# Patient Record
Sex: Female | Born: 1985 | Race: Black or African American | Hispanic: No | Marital: Single | Smoking: Never smoker
Health system: Southern US, Community
[De-identification: ages and names within clinical notes are randomized; demographics above are authoritative.]

## PROBLEM LIST (undated history)

## (undated) ENCOUNTER — Inpatient Hospital Stay (HOSPITAL_COMMUNITY): Payer: Self-pay

## (undated) DIAGNOSIS — E079 Disorder of thyroid, unspecified: Secondary | ICD-10-CM

## (undated) DIAGNOSIS — E039 Hypothyroidism, unspecified: Secondary | ICD-10-CM

## (undated) DIAGNOSIS — J302 Other seasonal allergic rhinitis: Secondary | ICD-10-CM

## (undated) DIAGNOSIS — E550 Rickets, active: Secondary | ICD-10-CM

## (undated) HISTORY — PX: TOOTH EXTRACTION: SUR596

---

## 2012-09-02 ENCOUNTER — Encounter (HOSPITAL_COMMUNITY): Payer: Self-pay | Admitting: Emergency Medicine

## 2012-09-02 ENCOUNTER — Emergency Department (HOSPITAL_COMMUNITY)
Admission: EM | Admit: 2012-09-02 | Discharge: 2012-09-02 | Disposition: A | Discharge status: Home / Self Care | Payer: Medicaid Other | Attending: Emergency Medicine | Admitting: Emergency Medicine

## 2012-09-02 DIAGNOSIS — Z79899 Other long term (current) drug therapy: Secondary | ICD-10-CM | POA: Insufficient documentation

## 2012-09-02 DIAGNOSIS — E079 Disorder of thyroid, unspecified: Secondary | ICD-10-CM | POA: Insufficient documentation

## 2012-09-02 DIAGNOSIS — M79609 Pain in unspecified limb: Secondary | ICD-10-CM | POA: Insufficient documentation

## 2012-09-02 DIAGNOSIS — E643 Sequelae of rickets: Secondary | ICD-10-CM | POA: Insufficient documentation

## 2012-09-02 DIAGNOSIS — Z8639 Personal history of other endocrine, nutritional and metabolic disease: Secondary | ICD-10-CM

## 2012-09-02 HISTORY — DX: Rickets, active: E55.0

## 2012-09-02 HISTORY — DX: Disorder of thyroid, unspecified: E07.9

## 2012-09-02 MED ORDER — ACETAMINOPHEN 500 MG PO TABS: 1000.0000 mg | ORAL_TABLET | Freq: Once | ORAL | Status: DC

## 2012-09-02 MED ORDER — OXYCODONE-ACETAMINOPHEN 5-325 MG PO TABS: ORAL_TABLET | ORAL | Status: DC

## 2012-09-02 MED ORDER — ACETAMINOPHEN 325 MG PO TABS
975.0000 mg | ORAL_TABLET | Freq: Once | ORAL | Status: AC
  Administered 2012-09-02: 975 mg via ORAL
  Filled 2012-09-02: qty 3

## 2012-09-02 NOTE — ED Notes (Signed)
Pt c/o B/l leg pain x 1 month. Pt ambulatory in triage.

## 2012-09-02 NOTE — ED Provider Notes (Signed)
Medical screening examination/treatment/procedure(s) were performed by non-physician practitioner and as supervising physician I was immediately available for consultation/collaboration.   Shelda Jakes, MD 09/02/12 [phone]

## 2012-09-02 NOTE — ED Provider Notes (Signed)
History     CSN: [id number]  Arrival date & time 09/02/12  7829   First MD Initiated Contact with Patient 09/02/12 1031      Chief Complaint  Patient presents with  . Leg Pain    (Consider location/radiation/quality/duration/timing/severity/associated sxs/prior treatment) HPI Comments: Pt born with rickets. Treated by othopedist in [place], [place] for the last month. She was scheduled for surgery to straighten the legs. Pt started levothyroxine and seeing an endocrinologist back in [place], too. Pt states it did not help the pain in her legs, but helped her sleep better at night.  Pt has been in town since November. Pain is constant. Worse when active or sitting too long. Legs get very stiff. Pt has been taking ibuprofen which provides a little help. But not much.  Patient is a 27 y.o. female presenting with pain secondary to rickets (congenital) Severity: severe Onset quality: waxes and wanes Duration: since birth Timing: Constant  Progression: worsening Relieved by: tylenol Worsened by: weight bearing Ineffective treatments: None tried      Patient is a 27 y.o. female presenting with leg pain.  Leg Pain Associated symptoms: no fever and no neck pain     Past Medical History  Diagnosis Date  . Rickets   . Thyroid disease hypothyroid    History reviewed. No pertinent past surgical history.  No family history on file.  History  Substance Use Topics  . Smoking status: Never Smoker   . Smokeless tobacco: Not on file  . Alcohol Use: Yes     Comment: occ    OB History   Grav Para Term Preterm Abortions TAB SAB Ect Mult Living                  Review of Systems  Constitutional: Negative for fever and diaphoresis.  HENT: Negative for neck pain and neck stiffness.   Eyes: Negative for visual disturbance.  Respiratory: Negative for apnea, chest tightness and shortness of breath.   Cardiovascular: Negative for chest pain and palpitations.  Gastrointestinal:  Negative for nausea, vomiting, diarrhea and constipation.  Genitourinary: Negative for dysuria.  Musculoskeletal: Positive for arthralgias and gait problem.       Bilateral leg pain just below the knee secondary to congenital rickets.  Skin: Negative for rash.  Neurological: Negative for dizziness, weakness, light-headedness, numbness and headaches.    Allergies  Review of patient's allergies indicates no known allergies.  Home Medications   Current Outpatient Rx  Name  Route  Sig  Dispense  Refill  . levothyroxine (SYNTHROID, LEVOTHROID) 50 MCG tablet   Oral   Take 50 mcg by mouth daily.           BP 135/79  Pulse 71  Temp(Src) 98.2 F (36.8 C) (Oral)  Resp 18  SpO2 98%  LMP 09/02/2012  Physical Exam  Nursing note and vitals reviewed. Constitutional: She is oriented to person, place, and time. She appears well-developed and well-nourished. No distress.  HENT:  Head: Normocephalic and atraumatic.  Eyes: EOM are normal. Pupils are equal, round, and reactive to light.  Neck: Normal range of motion. Neck supple.  No meningeal signs  Cardiovascular: Normal rate, regular rhythm and normal heart sounds.  Exam reveals no gallop and no friction rub.   No murmur heard. Pulmonary/Chest: Effort normal and breath sounds normal. No respiratory distress. She has no wheezes. She has no rales. She exhibits no tenderness.  Abdominal: Soft. Bowel sounds are normal. She exhibits no distension. There is  no tenderness. There is no rebound and no guarding.  Musculoskeletal: Normal range of motion. She exhibits no edema and no tenderness.  5/5 strength throughout. Mild swelling. No erythema. No warmth. No effusion. Good quadricep strength on straight leg raise. No joint laxity.  Neurological: She is alert and oriented to person, place, and time. No cranial nerve deficit.  Skin: Skin is warm and dry. She is not diaphoretic. No erythema.    ED Course  Procedures (including critical care  time)  Labs Reviewed - No data to display No results found.   Diagnosis: rickets, chronic    MDM  Pt has hx of rickets and states this pain is related to that chronic condition. Pt is new to town and did not know where to go to have relief from pain. Will provide resource guide and counsel pt to establish relationship with primary care provider for this chronic condition. No imaging needed at this time.  At this time there does not appear to be any evidence of an acute emergency medical condition and the patient appears stable for discharge with appropriate outpatient follow up.Diagnosis was discussed with patient who verbalizes understanding and is agreeable to discharge. Pt case discussed with Dr. Saul Fordyce who agrees with my plan.    Glade Nurse, PA-C 09/02/12 1643

## 2012-09-15 ENCOUNTER — Emergency Department (HOSPITAL_COMMUNITY)
Admission: EM | Admit: 2012-09-15 | Discharge: 2012-09-16 | Discharge status: Against Medical Advice | Payer: Medicaid Other | Attending: Emergency Medicine | Admitting: Emergency Medicine

## 2012-09-15 ENCOUNTER — Encounter (HOSPITAL_COMMUNITY): Payer: Self-pay | Admitting: Adult Health

## 2012-09-15 DIAGNOSIS — R51 Headache: Secondary | ICD-10-CM | POA: Insufficient documentation

## 2012-09-15 DIAGNOSIS — R110 Nausea: Secondary | ICD-10-CM | POA: Insufficient documentation

## 2012-09-15 DIAGNOSIS — H53149 Visual discomfort, unspecified: Secondary | ICD-10-CM | POA: Insufficient documentation

## 2012-09-15 NOTE — ED Notes (Signed)
Pt sts more severe HA

## 2012-09-15 NOTE — ED Notes (Signed)
Reports sudden onset of head throbbing one hour ago associated with light and sound sensitivity and nausea. Pt is alert and oriented. Tearful. MAEx4. Denies headache history.

## 2012-09-15 NOTE — ED Notes (Signed)
Called patient for room placement, no answer  

## 2012-09-18 ENCOUNTER — Encounter (HOSPITAL_COMMUNITY): Payer: Self-pay

## 2012-09-18 ENCOUNTER — Emergency Department (HOSPITAL_COMMUNITY)
Admission: EM | Admit: 2012-09-18 | Discharge: 2012-09-18 | Disposition: A | Discharge status: Home / Self Care | Payer: Medicaid Other | Attending: Emergency Medicine | Admitting: Emergency Medicine

## 2012-09-18 ENCOUNTER — Emergency Department (HOSPITAL_COMMUNITY): Payer: Medicaid Other

## 2012-09-18 DIAGNOSIS — H53149 Visual discomfort, unspecified: Secondary | ICD-10-CM | POA: Insufficient documentation

## 2012-09-18 DIAGNOSIS — J3489 Other specified disorders of nose and nasal sinuses: Secondary | ICD-10-CM | POA: Insufficient documentation

## 2012-09-18 DIAGNOSIS — R51 Headache: Secondary | ICD-10-CM | POA: Insufficient documentation

## 2012-09-18 DIAGNOSIS — Z79899 Other long term (current) drug therapy: Secondary | ICD-10-CM | POA: Insufficient documentation

## 2012-09-18 DIAGNOSIS — R110 Nausea: Secondary | ICD-10-CM | POA: Insufficient documentation

## 2012-09-18 DIAGNOSIS — Z3202 Encounter for pregnancy test, result negative: Secondary | ICD-10-CM | POA: Insufficient documentation

## 2012-09-18 DIAGNOSIS — E079 Disorder of thyroid, unspecified: Secondary | ICD-10-CM | POA: Insufficient documentation

## 2012-09-18 DIAGNOSIS — Z8639 Personal history of other endocrine, nutritional and metabolic disease: Secondary | ICD-10-CM | POA: Insufficient documentation

## 2012-09-18 DIAGNOSIS — J029 Acute pharyngitis, unspecified: Secondary | ICD-10-CM | POA: Insufficient documentation

## 2012-09-18 DIAGNOSIS — Z862 Personal history of diseases of the blood and blood-forming organs and certain disorders involving the immune mechanism: Secondary | ICD-10-CM | POA: Insufficient documentation

## 2012-09-18 LAB — URINALYSIS, ROUTINE W REFLEX MICROSCOPIC
Bilirubin Urine: NEGATIVE
Nitrite: NEGATIVE
Specific Gravity, Urine: 1.024 (ref 1.005–1.030)
Urobilinogen, UA: 0.2 mg/dL (ref 0.0–1.0)

## 2012-09-18 LAB — BASIC METABOLIC PANEL
CO2: 22 mEq/L (ref 19–32)
Calcium: 9 mg/dL (ref 8.4–10.5)
GFR calc Af Amer: >90 mL/min (ref >90)
GFR calc non Af Amer: >90 mL/min (ref >90)
Sodium: 136 mEq/L (ref 135–145)

## 2012-09-18 LAB — CBC WITH DIFFERENTIAL/PLATELET
Basophils Absolute: 0 10*3/uL (ref 0.0–0.1)
Eosinophils Relative: 1 % (ref 0–5)
Lymphocytes Relative: 33 % (ref 12–46)
MCV: 92 fL (ref 78.0–100.0)
Platelets: 240 10*3/uL (ref 150–400)
RDW: 12.2 % (ref 11.5–15.5)
WBC: 4 10*3/uL (ref 4.0–10.5)

## 2012-09-18 LAB — URINE MICROSCOPIC-ADD ON

## 2012-09-18 LAB — PREGNANCY, URINE: Preg Test, Ur: NEGATIVE

## 2012-09-18 MED ORDER — HYDROCODONE-ACETAMINOPHEN 5-325 MG PO TABS: 1.0000 | ORAL_TABLET | Freq: Four times a day (QID) | ORAL | Status: DC | PRN

## 2012-09-18 MED ORDER — NAPROXEN 500 MG PO TABS: 500.0000 mg | ORAL_TABLET | Freq: Two times a day (BID) | ORAL | Status: DC

## 2012-09-18 NOTE — ED Provider Notes (Signed)
History     CSN: [id number]  Arrival date & time 09/18/12  1004   First MD Initiated Contact with Patient 09/18/12 1116      Chief Complaint  Patient presents with  . Headache    (Consider location/radiation/quality/duration/timing/severity/associated sxs/prior treatment) Patient is a 27 y.o. female presenting with headaches. The history is provided by the patient.  Headache Associated symptoms: congestion, nausea, photophobia and sore throat   Associated symptoms: no abdominal pain, no back pain, no pain, no fever and no vomiting    patient with onset of headache for 2 days ago to the back of the head and then to the frontal area associated with some nausea no vertigo some photophobia. Patient's had a history of headaches in the past but nothing exactly like this. Patient states that the headache has remained the same. Improves some with Advil. Currently the headache pain is 4/10.  Past Medical History  Diagnosis Date  . Rickets   . Thyroid disease hypothyroid    History reviewed. No pertinent past surgical history.  No family history on file.  History  Substance Use Topics  . Smoking status: Never Smoker   . Smokeless tobacco: Not on file  . Alcohol Use: Yes     Comment: occ    OB History   Grav Para Term Preterm Abortions TAB SAB Ect Mult Living                  Review of Systems  Constitutional: Negative for fever.  HENT: Positive for congestion and sore throat.   Eyes: Positive for photophobia. Negative for pain and visual disturbance.  Respiratory: Negative for shortness of breath.   Cardiovascular: Negative for chest pain.  Gastrointestinal: Positive for nausea. Negative for vomiting and abdominal pain.  Genitourinary: Negative for dysuria.  Musculoskeletal: Negative for back pain.  Skin: Negative for rash.  Neurological: Positive for headaches.  Hematological: Does not bruise/bleed easily.  Psychiatric/Behavioral: Negative for confusion.     Allergies  Review of patient's allergies indicates no known allergies.  Home Medications   Current Outpatient Rx  Name  Route  Sig  Dispense  Refill  . levothyroxine (SYNTHROID, LEVOTHROID) 50 MCG tablet   Oral   Take 50 mcg by mouth daily.         Marland Kitchen HYDROcodone-acetaminophen (NORCO/VICODIN) 5-325 MG per tablet   Oral   Take 1-2 tablets by mouth every 6 (six) hours as needed for pain.   10 tablet   0   . naproxen (NAPROSYN) 500 MG tablet   Oral   Take 1 tablet (500 mg total) by mouth 2 (two) times daily.   14 tablet   0     BP 114/73  Pulse 80  Temp(Src) 97.7 F (36.5 C) (Oral)  Resp 20  SpO2 95%  LMP 09/02/2012  Physical Exam  Nursing note and vitals reviewed. Constitutional: She is oriented to person, place, and time. She appears well-developed and well-nourished. No distress.  HENT:  Head: Normocephalic and atraumatic.  Mouth/Throat: Oropharynx is clear and moist.  Eyes: Conjunctivae and EOM are normal. Pupils are equal, round, and reactive to light.  Neck: Normal range of motion. Neck supple.  Cardiovascular: Normal rate, regular rhythm and normal heart sounds.   Pulmonary/Chest: Effort normal and breath sounds normal.  Abdominal: Soft. Bowel sounds are normal. There is no tenderness.  Musculoskeletal: Normal range of motion. She exhibits no edema.  Neurological: She is alert and oriented to person, place, and time. No cranial  nerve deficit. Coordination normal.  Skin: Skin is warm. No rash noted.    ED Course  Procedures (including critical care time)  Labs Reviewed  URINALYSIS, ROUTINE W REFLEX MICROSCOPIC - Abnormal; Notable for the following:    Hgb urine dipstick MODERATE (*)    All other components within normal limits  BASIC METABOLIC PANEL - Abnormal; Notable for the following:    Creatinine, Ser 0.43 (*)    All other components within normal limits  PREGNANCY, URINE  CBC WITH DIFFERENTIAL  URINE MICROSCOPIC-ADD ON   Ct Head Wo  Contrast  09/18/2012  *RADIOLOGY REPORT*  Clinical Data: Progressive headache.  CT HEAD WITHOUT CONTRAST  Technique:  Contiguous axial images were obtained from the base of the skull through the vertex without contrast.  Comparison: None.  Findings: No acute intracranial abnormality.  Specifically, no hemorrhage, hydrocephalus, mass lesion, acute infarction, or significant intracranial injury.  No acute calvarial abnormality. Visualized paranasal sinuses and mastoids clear.  Orbital soft tissues unremarkable.  IMPRESSION: No acute intracranial abnormality.   Original Report Authenticated By: Charlett Nose, M.D.      1. Headache       MDM   Patient with headache for the past few days. Also has some mild upper respiratory cold-like symptoms. Headache may be related to the viral illness. Workup without any significant findings. We'll treat with Naprosyn and hydrocodone for the next few days. Resource guide provided to the patient. Patient no acute distress nontoxic. No history of similar headaches in the past. Head CT was negative for any evidence of bleed or other abnormality. Symptoms not consistent with subarachnoid hemorrhage.       Shelda Jakes, MD 09/18/12 [phone]

## 2012-09-18 NOTE — ED Notes (Signed)
Pt. Having a headache since Monday.   Pain increases when she moves her neck.  Pt. Is also having nausea.  No neuro deficits noted.  Skin is warm and dry.   Denies any vomiting

## 2012-10-16 ENCOUNTER — Ambulatory Visit: Payer: Self-pay | Admitting: Endocrinology

## 2012-10-16 DIAGNOSIS — Z0289 Encounter for other administrative examinations: Secondary | ICD-10-CM

## 2012-12-16 ENCOUNTER — Ambulatory Visit: Payer: Medicaid Other | Admitting: Endocrinology

## 2013-04-10 ENCOUNTER — Ambulatory Visit (INDEPENDENT_AMBULATORY_CARE_PROVIDER_SITE_OTHER): Payer: Medicaid Other | Admitting: Advanced Practice Midwife

## 2013-04-10 ENCOUNTER — Telehealth: Payer: Self-pay | Admitting: Advanced Practice Midwife

## 2013-04-10 ENCOUNTER — Encounter: Payer: Self-pay | Admitting: Advanced Practice Midwife

## 2013-04-10 VITALS — BP 117/79 | HR 72 | Temp 98.7°F | Ht 59.0 in | Wt 187.8 lb

## 2013-04-10 DIAGNOSIS — Z803 Family history of malignant neoplasm of breast: Secondary | ICD-10-CM | POA: Insufficient documentation

## 2013-04-10 DIAGNOSIS — Z0000 Encounter for general adult medical examination without abnormal findings: Secondary | ICD-10-CM

## 2013-04-10 DIAGNOSIS — M9080 Osteopathy in diseases classified elsewhere, unspecified site: Secondary | ICD-10-CM | POA: Insufficient documentation

## 2013-04-10 DIAGNOSIS — Z6837 Body mass index (BMI) 37.0-37.9, adult: Secondary | ICD-10-CM | POA: Insufficient documentation

## 2013-04-10 DIAGNOSIS — E550 Rickets, active: Secondary | ICD-10-CM | POA: Insufficient documentation

## 2013-04-10 DIAGNOSIS — E039 Hypothyroidism, unspecified: Secondary | ICD-10-CM | POA: Insufficient documentation

## 2013-04-10 NOTE — Progress Notes (Signed)
Subjective:     Julia Fowler is a 27 y.o. female here for a routine exam.  Current complaints: patient has been trying to conceive for the last two years without success. She is requesting a pregnancy test today.   Personal health questionnaire reviewed: yes.  Julia Fowler has a 9 year old son who has rickets. He is in therapy and doing well. She and her partner are trying to conceive for the past 2 years w/ out success. He has no children.   Patient has a sister diagnosed w/ BRCA @ 87 yo.    Gynecologic History Patient's last menstrual period was 03/24/2013. Contraception: none Last Pap: 01/2012. Results were: normal Last mammogram:N/A  Obstetric History OB History  No data available     The following portions of the patient's history were reviewed and updated as appropriate: allergies, current medications, past family history, past medical history, past social history, past surgical history and problem list.  Review of Systems A comprehensive review of systems was negative.    Objective:    BP 117/79  Pulse 72  Temp(Src) 98.7 F (37.1 C) (Oral)  Ht 4\' 11"  (1.499 m)  Wt 187 lb 12.8 oz (85.186 kg)  BMI 37.91 kg/m2  LMP 03/24/2013  General Appearance:    Alert, cooperative, no distress, appears stated age  Head:    Normocephalic, without obvious abnormality, atraumatic  Eyes:    PERRL, conjunctiva/corneas clear, EOM's intact, fundi    benign, both eyes  Ears:    Normal TM's and external ear canals, both ears  Nose:   Nares normal, septum midline, mucosa normal, no drainage    or sinus tenderness  Throat:   Lips, mucosa, and tongue normal; teeth and gums normal  Neck:   Supple, symmetrical, trachea midline, no adenopathy;    thyroid:  no enlargement/tenderness/nodules; no carotid   bruit or JVD  Back:     Symmetric, no curvature, ROM normal, no CVA tenderness  Lungs:     Clear to auscultation bilaterally, respirations unlabored  Chest Wall:    No tenderness or deformity   Heart:    Regular rate and rhythm, S1 and S2 normal, no murmur, rub   or gallop  Breast Exam:    No tenderness, masses, or nipple abnormality  Abdomen:     Soft, non-tender, bowel sounds active all four quadrants,    no masses, no organomegaly  Genitalia:    Normal female without lesion, discharge or tenderness. Cervix on anterior vaginal wall, uterus retroverted.     Extremities:   Extremities normal, atraumatic, no cyanosis or edema  Pulses:   2+ and symmetric all extremities  Skin:   Skin color, texture, turgor normal, no rashes or lesions  Lymph nodes:   Cervical, supraclavicular, and axillary nodes normal  Neurologic:   CNII-XII intact, normal strength, sensation and reflexes    throughout      Assessment:   Patient Active Problem List   Diagnosis Date Noted  . Hypothyroid 04/10/2013  . BMI 37.0-37.9, adult 04/10/2013  . Rickets 04/10/2013   Well woman exam Infertility Family Hx of BRCA   Plan:    Education reviewed: calcium supplements, depression evaluation, low fat, low cholesterol diet, safe sex/STD prevention, self breast exams and weight bearing exercise. Follow up in: as needed or in 6 months if no success w/ conceiving..   Reviewed in detail conceiving information and plan. Patient to return if after 6 months of tracking cycles and having intercourse during ovulation she is  not pregnant. Reviewed w/ patient to track signs of ovulation. Patient to see endocrinologist for immediate management of thyroid.  Patient sees therapist for complications assoc. W/ Rickets.  Patient to RTC for BRCA gene screening.  45 min spent with patient greater than 80% spent in counseling and coordination of care.    Trip Cavanagh Wilson Singer CNM

## 2013-04-11 LAB — GC/CHLAMYDIA PROBE AMP: CT Probe RNA: NEGATIVE

## 2013-04-17 ENCOUNTER — Other Ambulatory Visit: Payer: Medicaid Other

## 2013-06-01 ENCOUNTER — Other Ambulatory Visit (INDEPENDENT_AMBULATORY_CARE_PROVIDER_SITE_OTHER): Payer: Medicaid Other

## 2013-06-01 VITALS — Temp 98.2°F | Wt 179.0 lb

## 2013-06-01 DIAGNOSIS — Z3200 Encounter for pregnancy test, result unknown: Secondary | ICD-10-CM

## 2013-06-01 DIAGNOSIS — Z3201 Encounter for pregnancy test, result positive: Secondary | ICD-10-CM

## 2013-06-01 NOTE — Progress Notes (Signed)
Pt is here for a upt.  Pt states that her last menstral cycle was on 04/22/2013. Pt states that she did have a positive home test. In office UPT is positive.

## 2013-06-07 ENCOUNTER — Encounter (HOSPITAL_COMMUNITY): Payer: Self-pay | Admitting: Emergency Medicine

## 2013-06-07 ENCOUNTER — Emergency Department (HOSPITAL_COMMUNITY)
Admission: EM | Admit: 2013-06-07 | Discharge: 2013-06-07 | Disposition: A | Discharge status: Home / Self Care | Payer: Medicaid Other | Attending: Emergency Medicine | Admitting: Emergency Medicine

## 2013-06-07 DIAGNOSIS — H6593 Unspecified nonsuppurative otitis media, bilateral: Secondary | ICD-10-CM

## 2013-06-07 DIAGNOSIS — J069 Acute upper respiratory infection, unspecified: Secondary | ICD-10-CM | POA: Insufficient documentation

## 2013-06-07 DIAGNOSIS — O9989 Other specified diseases and conditions complicating pregnancy, childbirth and the puerperium: Secondary | ICD-10-CM | POA: Insufficient documentation

## 2013-06-07 DIAGNOSIS — Z862 Personal history of diseases of the blood and blood-forming organs and certain disorders involving the immune mechanism: Secondary | ICD-10-CM | POA: Insufficient documentation

## 2013-06-07 DIAGNOSIS — H748X9 Other specified disorders of middle ear and mastoid, unspecified ear: Secondary | ICD-10-CM | POA: Insufficient documentation

## 2013-06-07 DIAGNOSIS — Z8639 Personal history of other endocrine, nutritional and metabolic disease: Secondary | ICD-10-CM | POA: Insufficient documentation

## 2013-06-07 DIAGNOSIS — R110 Nausea: Secondary | ICD-10-CM | POA: Insufficient documentation

## 2013-06-07 MED ORDER — OXYMETAZOLINE HCL 0.05 % NA SOLN
1.0000 | Freq: Two times a day (BID) | NASAL | Status: DC
  Administered 2013-06-07: 1 via NASAL
  Filled 2013-06-07: qty 15

## 2013-06-07 NOTE — ED Provider Notes (Signed)
Medical screening examination/treatment/procedure(s) were performed by non-physician practitioner and as supervising physician I was immediately available for consultation/collaboration.  EKG Interpretation   None        Dolan Xia R. Alieu Finnigan, MD 06/07/13 2359 

## 2013-06-07 NOTE — ED Notes (Addendum)
Pt c/o intermittent R ear pain and hearing a "wooshing" on and off in her R ear.  Pt also states her throat has been "dry and phlegmy".  Pt is 6.[redacted] weeks pregnant.

## 2013-06-07 NOTE — Discharge Instructions (Signed)
1. Medications: Aftin (USE THIS FOR 3 DAYS ONLY THEN STOP), usual home medications 2. Treatment: rest, drink plenty of fluids, also use a cool mist vaporizer,  3. Follow Up: Please followup with your primary doctor for discussion of your diagnoses and further evaluation after today's visit; if you do not have a primary care doctor use the resource guide provided to find one;    Upper Respiratory Infection, Adult An upper respiratory infection (URI) is also sometimes known as the common cold. The upper respiratory tract includes the nose, sinuses, throat, trachea, and bronchi. Bronchi are the airways leading to the lungs. Most people improve within 1 week, but symptoms can last up to 2 weeks. A residual cough may last even longer.  CAUSES Many different viruses can infect the tissues lining the upper respiratory tract. The tissues become irritated and inflamed and often become very moist. Mucus production is also common. A cold is contagious. You can easily spread the virus to others by oral contact. This includes kissing, sharing a glass, coughing, or sneezing. Touching your mouth or nose and then touching a surface, which is then touched by another person, can also spread the virus. SYMPTOMS  Symptoms typically develop 1 to 3 days after you come in contact with a cold virus. Symptoms vary from person to person. They may include:  Runny nose.  Sneezing.  Nasal congestion.  Sinus irritation.  Sore throat.  Loss of voice (laryngitis).  Cough.  Fatigue.  Muscle aches.  Loss of appetite.  Headache.  Low-grade fever. DIAGNOSIS  You might diagnose your own cold based on familiar symptoms, since most people get a cold 2 to 3 times a year. Your caregiver can confirm this based on your exam. Most importantly, your caregiver can check that your symptoms are not due to another disease such as strep throat, sinusitis, pneumonia, asthma, or epiglottitis. Blood tests, throat tests, and X-rays  are not necessary to diagnose a common cold, but they may sometimes be helpful in excluding other more serious diseases. Your caregiver will decide if any further tests are required. RISKS AND COMPLICATIONS  You may be at risk for a more severe case of the common cold if you smoke cigarettes, have chronic heart disease (such as heart failure) or lung disease (such as asthma), or if you have a weakened immune system. The very young and very old are also at risk for more serious infections. Bacterial sinusitis, middle ear infections, and bacterial pneumonia can complicate the common cold. The common cold can worsen asthma and chronic obstructive pulmonary disease (COPD). Sometimes, these complications can require emergency medical care and may be life-threatening. PREVENTION  The best way to protect against getting a cold is to practice good hygiene. Avoid oral or hand contact with people with cold symptoms. Wash your hands often if contact occurs. There is no clear evidence that vitamin C, vitamin E, echinacea, or exercise reduces the chance of developing a cold. However, it is always recommended to get plenty of rest and practice good nutrition. TREATMENT  Treatment is directed at relieving symptoms. There is no cure. Antibiotics are not effective, because the infection is caused by a virus, not by bacteria. Treatment may include:  Increased fluid intake. Sports drinks offer valuable electrolytes, sugars, and fluids.  Breathing heated mist or steam (vaporizer or shower).  Eating chicken soup or other clear broths, and maintaining good nutrition.  Getting plenty of rest.  Using gargles or lozenges for comfort.  Controlling fevers with ibuprofen  or acetaminophen as directed by your caregiver.  Increasing usage of your inhaler if you have asthma. Zinc gel and zinc lozenges, taken in the first 24 hours of the common cold, can shorten the duration and lessen the severity of symptoms. Pain medicines  may help with fever, muscle aches, and throat pain. A variety of non-prescription medicines are available to treat congestion and runny nose. Your caregiver can make recommendations and may suggest nasal or lung inhalers for other symptoms.  HOME CARE INSTRUCTIONS   Only take over-the-counter or prescription medicines for pain, discomfort, or fever as directed by your caregiver.  Use a warm mist humidifier or inhale steam from a shower to increase air moisture. This may keep secretions moist and make it easier to breathe.  Drink enough water and fluids to keep your urine clear or pale yellow.  Rest as needed.  Return to work when your temperature has returned to normal or as your caregiver advises. You may need to stay home longer to avoid infecting others. You can also use a face mask and careful hand washing to prevent spread of the virus. SEEK MEDICAL CARE IF:   After the first few days, you feel you are getting worse rather than better.  You need your caregiver's advice about medicines to control symptoms.  You develop chills, worsening shortness of breath, or brown or red sputum. These may be signs of pneumonia.  You develop yellow or brown nasal discharge or pain in the face, especially when you bend forward. These may be signs of sinusitis.  You develop a fever, swollen neck glands, pain with swallowing, or white areas in the back of your throat. These may be signs of strep throat. SEEK IMMEDIATE MEDICAL CARE IF:   You have a fever.  You develop severe or persistent headache, ear pain, sinus pain, or chest pain.  You develop wheezing, a prolonged cough, cough up blood, or have a change in your usual mucus (if you have chronic lung disease).  You develop sore muscles or a stiff neck. Document Released: 12/19/2000 Document Revised: 09/17/2011 Document Reviewed: 10/27/2010 [facility] Patient Information 2014 [place], [place].

## 2013-06-07 NOTE — ED Provider Notes (Signed)
CSN: [id number]     Arrival date & time 06/07/13  1612 History  This chart was scribed for non-physician practitioner Dierdre Forth, PA-C working with Juliet Rude. Rubin Payor, MD by Danella Maiers, ED Scribe. This patient was seen in room TR05C/TR05C and the patient's care was started at 4:55 PM.   Chief Complaint  Patient presents with  . Otalgia   The history is provided by the patient. No language interpreter was used.   HPI Comments: Julia Fowler is a 27 y.o. female who is [redacted] weeks pregnant who presents to the Emergency Department complaining of intermittent bilateral otalgia, worse on the right, for the past week. She states she has been hearing a "whooshing" noise intermittently in the same ear. Pt also reports having phlegm in her throat, nasal congestion and cough the last 2 days. She has been using Nasonex and states it only helps for a little while and then the pain and sound returns. She denies fevers, chills, vomiting. She reports nausea and states it is due to the pregnancy. She is taking her prenatal vitamins. She is 6.[redacted] weeks pregnant.  Past Medical History  Diagnosis Date  . Rickets   . Thyroid disease hypothyroid   History reviewed. No pertinent past surgical history. Family History  Problem Relation Age of Onset  . Cancer Sister     breast  . Diabetes Maternal Grandmother    History  Substance Use Topics  . Smoking status: Never Smoker   . Smokeless tobacco: Not on file  . Alcohol Use: Yes     Comment: occ   OB History   Grav Para Term Preterm Abortions TAB SAB Ect Mult Living   1              Review of Systems  Constitutional: Negative for fever, diaphoresis, appetite change, fatigue and unexpected weight change.  HENT: Positive for ear pain. Negative for mouth sores.   Eyes: Negative for visual disturbance.  Respiratory: Positive for cough. Negative for chest tightness, shortness of breath and wheezing.   Cardiovascular: Negative for chest pain.   Gastrointestinal: Positive for nausea. Negative for vomiting, abdominal pain, diarrhea and constipation.  Endocrine: Negative for polydipsia, polyphagia and polyuria.  Genitourinary: Negative for dysuria, urgency, frequency and hematuria.  Musculoskeletal: Negative for back pain and neck stiffness.  Skin: Negative for rash.  Allergic/Immunologic: Negative for immunocompromised state.  Neurological: Negative for syncope, light-headedness and headaches.  Hematological: Does not bruise/bleed easily.  Psychiatric/Behavioral: Negative for sleep disturbance. The patient is not nervous/anxious.     Allergies  Review of patient's allergies indicates no known allergies.  Home Medications   Current Outpatient Rx  Name  Route  Sig  Dispense  Refill  . Prenatal Vit-Fe Fumarate-FA (PRENATAL MULTIVITAMIN) TABS tablet   Oral   Take 1 tablet by mouth daily at 12 noon.          BP 132/70  Pulse 73  Temp(Src) 98 F (36.7 C) (Oral)  Resp 18  Wt 181 lb 9.6 oz (82.373 kg)  SpO2 98%  LMP 04/22/2013 Physical Exam  Nursing note and vitals reviewed. Constitutional: She is oriented to person, place, and time. She appears well-developed and well-nourished. No distress.  Awake, alert, nontoxic appearance  HENT:  Head: Normocephalic and atraumatic.  Right Ear: Hearing, tympanic membrane, external ear and ear canal normal. Tympanic membrane is not injected, not scarred, not perforated, not erythematous, not retracted and not bulging. Right ear middle ear effusion: clear without bugling.  Left Ear:  Hearing, tympanic membrane, external ear and ear canal normal. Tympanic membrane is not injected, not scarred, not perforated, not erythematous, not retracted and not bulging. Left ear middle ear effusion:  clear without bulging.  Nose: Mucosal edema and rhinorrhea present.  Mouth/Throat: Uvula is midline. No uvula swelling. Posterior oropharyngeal edema and posterior oropharyngeal erythema present. No  oropharyngeal exudate or tonsillar abscesses.  Mild bilateral ear effusion of clear fluid without TM bulging or erythema. Normal hearing in both ears Mild edema and erythema of the oropharynx with open crypts but without exudate  Eyes: Conjunctivae are normal. No scleral icterus.  Neck: Normal range of motion. Neck supple.  Cardiovascular: Normal rate, regular rhythm, normal heart sounds and intact distal pulses.   No murmur heard. Pulmonary/Chest: Effort normal and breath sounds normal. No respiratory distress. She has no wheezes. She has no rales.  Abdominal: Soft. Bowel sounds are normal. She exhibits no distension. There is no tenderness. There is no rebound.  abd soft and nontender  Musculoskeletal: Normal range of motion. She exhibits no edema.  Neurological: She is alert and oriented to person, place, and time.  Speech is clear and goal oriented Moves extremities without ataxia  Skin: Skin is warm and dry. No rash noted. She is not diaphoretic. No erythema.  Psychiatric: She has a normal mood and affect.    ED Course  Procedures (including critical care time) Medications  oxymetazoline (AFRIN) 0.05 % nasal spray 1 spray (not administered)    DIAGNOSTIC STUDIES: Oxygen Saturation is 98% on RA, normal by my interpretation.    COORDINATION OF CARE: 5:20 PM- Discussed treatment plan with pt. Pt agrees to plan.    Labs Review Labs Reviewed - No data to display Imaging Review No results found.  EKG Interpretation   None       MDM   1. Middle ear effusion, bilateral   2. Viral URI with cough     Julia Fowler presents with viral URI symptoms and "whooshing" in her ears.  Patient without exudate but mild tonsillar edema, nasal congestion and clear effusion of fluid behind the TMs. No evidence of acute otitis media.  Patient is already using Nasonex. Discussed with [facility] the use of Afrin as it is a pregnancy category C. They recommend use for 3 days and states  it is safe in early pregnancy.  Patient has been cautioned to D/C the medication after 3 days and followup with her primary care physician.  There is no evidence of peritonsillar abscess, bacterial pharyngitis or pneumonia.  Patient denies abdominal pain or vaginal bleeding and is receiving routine prenatal care.   It has been determined that no acute conditions requiring further emergency intervention are present at this time. The patient/guardian have been advised of the diagnosis and plan. We have discussed signs and symptoms that warrant return to the ED, such as changes or worsening in symptoms.   Vital signs are stable at discharge.   BP 132/70  Pulse 73  Temp(Src) 98 F (36.7 C) (Oral)  Resp 18  Wt 181 lb 9.6 oz (82.373 kg)  SpO2 98%  LMP 04/22/2013  Patient/guardian has voiced understanding and agreed to follow-up with the PCP or specialist.      Dierdre Forth, PA-C 06/07/13 1735

## 2013-06-13 ENCOUNTER — Encounter (HOSPITAL_COMMUNITY): Payer: Self-pay | Admitting: Emergency Medicine

## 2013-06-13 ENCOUNTER — Emergency Department (HOSPITAL_COMMUNITY)
Admission: EM | Admit: 2013-06-13 | Discharge: 2013-06-13 | Disposition: A | Discharge status: Home / Self Care | Payer: Medicaid Other | Attending: Emergency Medicine | Admitting: Emergency Medicine

## 2013-06-13 DIAGNOSIS — H9201 Otalgia, right ear: Secondary | ICD-10-CM

## 2013-06-13 DIAGNOSIS — H9209 Otalgia, unspecified ear: Secondary | ICD-10-CM | POA: Insufficient documentation

## 2013-06-13 DIAGNOSIS — O9989 Other specified diseases and conditions complicating pregnancy, childbirth and the puerperium: Secondary | ICD-10-CM | POA: Insufficient documentation

## 2013-06-13 DIAGNOSIS — Z862 Personal history of diseases of the blood and blood-forming organs and certain disorders involving the immune mechanism: Secondary | ICD-10-CM | POA: Insufficient documentation

## 2013-06-13 DIAGNOSIS — J3489 Other specified disorders of nose and nasal sinuses: Secondary | ICD-10-CM | POA: Insufficient documentation

## 2013-06-13 DIAGNOSIS — Z8639 Personal history of other endocrine, nutritional and metabolic disease: Secondary | ICD-10-CM | POA: Insufficient documentation

## 2013-06-13 HISTORY — DX: Other seasonal allergic rhinitis: J30.2

## 2013-06-13 NOTE — ED Notes (Signed)
Pt states her right ear has been bothering her for the past 3 weeks. Has been seen here prior for this and was given a decongestant and told she had fluid behind her ear.

## 2013-06-13 NOTE — ED Provider Notes (Signed)
CSN: [id number]     Arrival date & time 06/13/13  0705 History   First MD Initiated Contact with Patient 06/13/13 [phone]     Chief Complaint  Patient presents with  . Otalgia   (Consider location/radiation/quality/duration/timing/severity/associated sxs/prior Treatment) HPI Comments: Patient is a 27 year old female who is currently 7.[redacted] weeks pregnant who presents for right-sided otalgia x3 weeks. Patient states the pain is pressure-like in nature and she describes an "whooshing" in her right ear associated with her symptoms. Patient also endorses associated nasal congestion that has persisted since symptom onset. She has tried Mucinex and Afrin for symptoms. She states she used Afrin for approximately 5 days despite being instructed to use for only 3. Patient denies seeing her primary doctor for this complaint. She denies associated fever, rhinorrhea, ear redness, hearing loss, ear discharge, sore throat, inability to swallow, neck stiffness, shortness of breath, abdominal pain, and vaginal bleeding or discharge.  Patient is a 27 y.o. female presenting with ear pain. The history is provided by the patient. No language interpreter was used.  Otalgia Associated symptoms: congestion   Associated symptoms: no fever     Past Medical History  Diagnosis Date  . Rickets   . Thyroid disease hypothyroid  . Seasonal allergies    History reviewed. No pertinent past surgical history. Family History  Problem Relation Age of Onset  . Cancer Sister     breast  . Diabetes Maternal Grandmother    History  Substance Use Topics  . Smoking status: Never Smoker   . Smokeless tobacco: Not on file  . Alcohol Use: Yes     Comment: occ   OB History   Grav Para Term Preterm Abortions TAB SAB Ect Mult Living   1              Review of Systems  Constitutional: Negative for fever.  HENT: Positive for congestion and ear pain.   All other systems reviewed and are negative.    Allergies  Review of  patient's allergies indicates no known allergies.  Home Medications   Current Outpatient Rx  Name  Route  Sig  Dispense  Refill  . Prenatal Vit-Fe Fumarate-FA (PRENATAL MULTIVITAMIN) TABS tablet   Oral   Take 1 tablet by mouth daily at 12 noon.          BP 114/73  Pulse 77  Temp(Src) 98.3 F (36.8 C) (Oral)  Resp 20  Ht 4\' 11"  (1.499 m)  Wt 178 lb (80.74 kg)  BMI 35.93 kg/m2  SpO2 98%  LMP 04/22/2013  Physical Exam  Nursing note and vitals reviewed. Constitutional: She is oriented to person, place, and time. She appears well-developed and well-nourished. No distress.  HENT:  Head: Normocephalic and atraumatic.  Right Ear: External ear and ear canal normal. Tympanic membrane is not injected, not perforated, not erythematous, not retracted and not bulging. A middle ear effusion (Mild) is present. No decreased hearing is noted.  Left Ear: External ear and ear canal normal. Tympanic membrane is not injected, not perforated, not erythematous, not retracted and not bulging. A middle ear effusion (Mild) is present. No decreased hearing is noted.  Nose: Right sinus exhibits maxillary sinus tenderness.  Mouth/Throat: Uvula is midline, oropharynx is clear and moist and mucous membranes are normal. No oropharyngeal exudate or posterior oropharyngeal erythema.  Mild bilateral middle ear effusion appreciated. Fluid clear and nonpurulent. No mastoid swelling, erythema, heat to touch, or tenderness to palpation bilaterally. Hearing grossly intact bilaterally.  Eyes: Conjunctivae  and EOM are normal. Pupils are equal, round, and reactive to light. Right eye exhibits no discharge. Left eye exhibits no discharge. No scleral icterus.  Neck: Normal range of motion. Neck supple.  Patient moves neck with ease. No anterior cervical lymphadenopathy appreciated  Cardiovascular: Normal rate, regular rhythm and normal heart sounds.   Pulmonary/Chest: Effort normal and breath sounds normal. No respiratory  distress. She has no wheezes. She has no rales.  Abdominal: Soft. There is no tenderness. There is no rebound.  Musculoskeletal: Normal range of motion.  Neurological: She is alert and oriented to person, place, and time.  Skin: Skin is warm and dry. No rash noted. She is not diaphoretic. No erythema. No pallor.  Psychiatric: She has a normal mood and affect. Her behavior is normal.    ED Course  Procedures (including critical care time) Labs Review Labs Reviewed - No data to display Imaging Review No results found.  EKG Interpretation   None       MDM   1. Otalgia, right    Uncomplicated otalgia and a 7.[redacted] week pregnant female. She endorses regular prenatal follow up. Patient has been seen before for this complaint. She denies any worsening of her symptoms, but states that symptoms have persisted. Patient well and nontoxic appearing, hemodynamically stable, and afebrile today. Physical exam significant only for mild bilateral middle ear effusions; no bulging or retraction of the tympanic membrane appreciated; canals and TMs nonerythematous. Patient stable for discharge today with instruction to use nasal saline spray or over-the-counter sinus saline rinses. Will also refer to ENT for further evaluation of symptoms. Return precautions advised and patient agreeable to plan with no unaddressed concerns.    Antony Madura, PA-C 06/13/13 [phone]

## 2013-06-14 NOTE — ED Provider Notes (Signed)
Medical screening examination/treatment/procedure(s) were performed by non-physician practitioner and as supervising physician I was immediately available for consultation/collaboration.    Kamar Callender L Romi Rathel, MD 06/14/13 0739 

## 2013-07-14 ENCOUNTER — Encounter: Payer: Self-pay | Admitting: Advanced Practice Midwife

## 2013-07-14 ENCOUNTER — Ambulatory Visit (INDEPENDENT_AMBULATORY_CARE_PROVIDER_SITE_OTHER): Payer: Medicaid Other | Admitting: Advanced Practice Midwife

## 2013-07-14 ENCOUNTER — Other Ambulatory Visit: Payer: Self-pay | Admitting: Advanced Practice Midwife

## 2013-07-14 VITALS — BP 120/81 | Temp 99.1°F | Wt 183.0 lb

## 2013-07-14 DIAGNOSIS — Z98891 History of uterine scar from previous surgery: Secondary | ICD-10-CM | POA: Insufficient documentation

## 2013-07-14 DIAGNOSIS — Z3201 Encounter for pregnancy test, result positive: Secondary | ICD-10-CM

## 2013-07-14 DIAGNOSIS — Z3481 Encounter for supervision of other normal pregnancy, first trimester: Secondary | ICD-10-CM

## 2013-07-14 LAB — POCT URINE PREGNANCY: Preg Test, Ur: POSITIVE

## 2013-07-14 LAB — POCT URINALYSIS DIPSTICK
PH UA: 6
RBC UA: 250
Spec Grav, UA: 1.015
UROBILINOGEN UA: NEGATIVE

## 2013-07-14 MED ORDER — OB COMPLETE PETITE 35-5-1-200 MG PO CAPS: 1.0000 | ORAL_CAPSULE | Freq: Every day | ORAL | Status: DC

## 2013-07-14 NOTE — Progress Notes (Signed)
   Subjective:    Julia CantorLisa Fowler is a [id number] being seen today for her first obstetrical visit.  Her obstetrical history is significant for NOB. Patient does intend to breast feed. Pregnancy history fully reviewed.  Patient desires US due to family history of twins. Reports mild cramping. Denies bleeding. Patient's sister was diagnosed with breast cancer in her early 5830s, she had radiation and chemo, is currently doing well.   Patient reports previous c-section @ 3 cm because she could not dialate. Patient was unaware of option to VBAC, initially made the statement she desired a repeat c-section.  Filed Vitals:   07/14/13 1024  BP: 120/81  Temp: 99.1 F (37.3 C)  Weight: 183 lb (83.008 kg)    HISTORY: OB History  Gravida Para Term Preterm AB SAB TAB Ectopic Multiple Living  3 1 1  1     1     # Outcome Date GA Lbr Len/2nd Weight Sex Delivery Anes PTL Lv  3 CUR           2 ABT 06/27/09 4339w0d     Other  N     Comments: abortion  1 TRM 10/20/00 6033w0d  7 lb 7 oz (3.374 kg) M CS EPI N Y     Comments: Did not progress past 3 cm     Past Medical History  Diagnosis Date  . Rickets   . Thyroid disease hypothyroid  . Seasonal allergies    Past Surgical History  Procedure Laterality Date  . Cesarean section    . Tooth extraction Left    Family History  Problem Relation Age of Onset  . Cancer Sister     breast  . Diabetes Maternal Grandmother      Exam    Uterus:     Pelvic Exam:    Perineum: No Hemorrhoids   Vulva: normal   Vagina:  normal mucosa   pH:    Cervix: anteverted   Adnexa: normal adnexa   Bony Pelvis: small  System: Breast:  normal appearance, no masses or tenderness   Skin: normal coloration and turgor, no rashes    Neurologic: oriented, normal   Extremities: normal strength, tone, and muscle mass   HEENT PERRLA   Mouth/Teeth mucous membranes moist, pharynx normal without lesions   Neck supple   Cardiovascular: regular rate and rhythm   Respiratory:  appears well, vitals normal, no respiratory distress, acyanotic, normal RR, ear and throat exam is normal, neck free of mass or lymphadenopathy, chest clear, no wheezing, crepitations, rhonchi, normal symmetric air entry   Abdomen: soft, non-tender; bowel sounds normal; no masses,  no organomegaly   Urinary: urethral meatus normal      Assessment:    Pregnancy: [id number] Patient Active Problem List   Diagnosis Date Noted  . History of C-section 07/14/2013  . Hypothyroid 04/10/2013  . BMI 37.0-37.9, adult 04/10/2013  . Rickets 04/10/2013  . Family history of breast cancer in first degree relative 04/10/2013     VBAC success Perinatology Calc = 30%   Plan:     Initial labs drawn. Prenatal vitamins. Problem list reviewed and updated. Genetic Screening discussed Quad Screen: Plan @ [facility].  Ultrasound discussed; fetal survey: requested, for dating and rule out multiple.  Follow up in 4 weeks. Discuss VBAC and chance of success [facility].   80% of 40 min visit spent on counseling and coordination of care.     Michaila Kenney 07/14/2013

## 2013-07-14 NOTE — Progress Notes (Signed)
HR - 91 Pt in office today for initial OB visit, pt has hx of multiple births in her family, she would like to know when she will be able to have an [place]

## 2013-07-14 NOTE — Addendum Note (Signed)
Addended by: Elby BeckPAUL, Taite Baldassari F on: 07/14/2013 02:00 PM   Modules accepted: Orders

## 2013-07-15 LAB — OBSTETRIC PANEL
Antibody Screen: NEGATIVE
BASOS PCT: 1 % (ref 0–1)
Basophils Absolute: 0 10*3/uL (ref 0.0–0.1)
EOS ABS: 0.1 10*3/uL (ref 0.0–0.7)
EOS PCT: 1 % (ref 0–5)
HCT: 33.5 % — ABNORMAL LOW (ref 36.0–46.0)
Hemoglobin: 11.5 g/dL — ABNORMAL LOW (ref 12.0–15.0)
Hepatitis B Surface Ag: NEGATIVE
LYMPHS ABS: 1.7 10*3/uL (ref 0.7–4.0)
Lymphocytes Relative: 31 % (ref 12–46)
MCH: 31.6 pg (ref 26.0–34.0)
MCHC: 34.3 g/dL (ref 30.0–36.0)
MCV: 92 fL (ref 78.0–100.0)
Monocytes Absolute: 0.3 10*3/uL (ref 0.1–1.0)
Monocytes Relative: 5 % (ref 3–12)
Neutro Abs: 3.4 10*3/uL (ref 1.7–7.7)
Neutrophils Relative %: 62 % (ref 43–77)
PLATELETS: 230 10*3/uL (ref 150–400)
RBC: 3.64 MIL/uL — ABNORMAL LOW (ref 3.87–5.11)
RDW: 12.7 % (ref 11.5–15.5)
Rh Type: POSITIVE
Rubella: 1.53 Index — ABNORMAL HIGH (ref <0.90)
WBC: 5.5 10*3/uL (ref 4.0–10.5)

## 2013-07-15 LAB — HIV ANTIBODY (ROUTINE TESTING W REFLEX): HIV: NONREACTIVE

## 2013-07-15 LAB — VITAMIN D 25 HYDROXY (VIT D DEFICIENCY, FRACTURES): Vit D, 25-Hydroxy: 26 ng/mL — ABNORMAL LOW (ref 30–89)

## 2013-07-15 LAB — CULTURE, OB URINE
Colony Count: NO GROWTH
Organism ID, Bacteria: NO GROWTH

## 2013-07-15 LAB — GC/CHLAMYDIA PROBE AMP
CT Probe RNA: NEGATIVE
GC Probe RNA: NEGATIVE

## 2013-07-16 LAB — HEMOGLOBINOPATHY EVALUATION
HGB A2 QUANT: 2.7 % (ref 2.2–3.2)
HGB A: 97.3 % (ref 96.8–97.8)
HGB S QUANTITAION: 0 %
Hemoglobin Other: 0 %
Hgb F Quant: 0 % (ref 0.0–2.0)

## 2013-07-16 LAB — PAP IG AND HPV HIGH-RISK: HPV DNA HIGH RISK: NOT DETECTED

## 2013-07-17 LAB — TSH

## 2013-07-21 ENCOUNTER — Other Ambulatory Visit: Payer: Self-pay | Admitting: *Deleted

## 2013-07-21 ENCOUNTER — Ambulatory Visit (INDEPENDENT_AMBULATORY_CARE_PROVIDER_SITE_OTHER): Payer: Medicaid Other

## 2013-07-21 ENCOUNTER — Other Ambulatory Visit: Payer: Self-pay | Admitting: Advanced Practice Midwife

## 2013-07-21 DIAGNOSIS — O3680X0 Pregnancy with inconclusive fetal viability, not applicable or unspecified: Secondary | ICD-10-CM

## 2013-07-21 DIAGNOSIS — E039 Hypothyroidism, unspecified: Secondary | ICD-10-CM

## 2013-07-29 ENCOUNTER — Other Ambulatory Visit: Payer: Medicaid Other

## 2013-07-29 DIAGNOSIS — E039 Hypothyroidism, unspecified: Secondary | ICD-10-CM

## 2013-07-30 LAB — AFP, QUAD SCREEN
AFP: 18.3 IU/mL
Age Alone: 1:896 {titer}
Curr Gest Age: 14 wks.days
Down Syndrome Scr Risk Est: 1:601 {titer}
HCG TOTAL: 68388 m[IU]/mL
INH: 259.9 pg/mL
Interpretation-AFP: NEGATIVE
MOM FOR AFP: 0.77
MOM FOR HCG: 2
MoM for INH: 1.26
OPEN SPINA BIFIDA: NEGATIVE
Osb Risk: <1:54600 {titer}
TRI 18 SCR RISK EST: NEGATIVE
Trisomy 18 (Edward) Syndrome Interp.: 1:61400 {titer}
UE3 VALUE: 0.2 ng/mL
uE3 Mom: 0.99

## 2013-07-30 LAB — THYROID PANEL WITH TSH
FREE THYROXINE INDEX: 2.6 (ref 1.0–3.9)
T3 Uptake: 18.5 % — ABNORMAL LOW (ref 22.5–37.0)
T4, Total: 13.8 ug/dL — ABNORMAL HIGH (ref 5.0–12.5)
TSH: 3.354 u[IU]/mL (ref 0.350–4.500)

## 2013-08-11 ENCOUNTER — Ambulatory Visit (INDEPENDENT_AMBULATORY_CARE_PROVIDER_SITE_OTHER): Payer: Medicaid Other | Admitting: Obstetrics

## 2013-08-11 ENCOUNTER — Encounter: Payer: Self-pay | Admitting: Obstetrics

## 2013-08-11 VITALS — BP 110/73 | Temp 98.6°F | Wt 188.0 lb

## 2013-08-11 DIAGNOSIS — Z1389 Encounter for screening for other disorder: Secondary | ICD-10-CM

## 2013-08-11 DIAGNOSIS — Z3480 Encounter for supervision of other normal pregnancy, unspecified trimester: Secondary | ICD-10-CM

## 2013-08-11 LAB — POCT URINALYSIS DIPSTICK
Bilirubin, UA: NEGATIVE
Blood, UA: NEGATIVE
Glucose, UA: NEGATIVE
Ketones, UA: NEGATIVE
Leukocytes, UA: NEGATIVE
Nitrite, UA: NEGATIVE
Protein, UA: NEGATIVE
Spec Grav, UA: 1.015
Urobilinogen, UA: NEGATIVE
pH, UA: 5

## 2013-08-11 NOTE — Progress Notes (Signed)
Pulse:89 Patient states she is having some lower abdominal pressure. Patient denies any concerns.

## 2013-09-08 ENCOUNTER — Ambulatory Visit (INDEPENDENT_AMBULATORY_CARE_PROVIDER_SITE_OTHER): Payer: Medicaid Other | Admitting: Advanced Practice Midwife

## 2013-09-08 ENCOUNTER — Ambulatory Visit (HOSPITAL_COMMUNITY)
Admission: RE | Admit: 2013-09-08 | Discharge: 2013-09-08 | Disposition: A | Discharge status: Home / Self Care | Payer: Medicaid Other | Source: Ambulatory Visit | Attending: Obstetrics | Admitting: Obstetrics

## 2013-09-08 ENCOUNTER — Encounter: Payer: Self-pay | Admitting: Advanced Practice Midwife

## 2013-09-08 VITALS — BP 107/73 | Temp 97.9°F | Wt 189.0 lb

## 2013-09-08 DIAGNOSIS — Z3689 Encounter for other specified antenatal screening: Secondary | ICD-10-CM | POA: Insufficient documentation

## 2013-09-08 DIAGNOSIS — Z1389 Encounter for screening for other disorder: Secondary | ICD-10-CM

## 2013-09-08 DIAGNOSIS — O34219 Maternal care for unspecified type scar from previous cesarean delivery: Secondary | ICD-10-CM | POA: Insufficient documentation

## 2013-09-08 DIAGNOSIS — Z3480 Encounter for supervision of other normal pregnancy, unspecified trimester: Secondary | ICD-10-CM

## 2013-09-08 NOTE — Progress Notes (Signed)
Subjective: Calvert CantorLisa Massing is a [age] y.o. at 19 weeks by LMP  Patient denies vaginal leaking of fluid or bleeding, denies contractions.  Reports positive fetal movment.  Denies concerns today. Had US today.  Objective: Filed Vitals:   09/08/13 1048  BP: 107/73  Temp: 97.9 F (36.6 C)   150 FHR @U  Fundal Height Fetal Position, Unknown  Assessment: Patient Active Problem List   Diagnosis Date Noted  . History of C-section 07/14/2013  . Hypothyroid 04/10/2013  . BMI 37.0-37.9, adult 04/10/2013  . Rickets 04/10/2013  . Family history of breast cancer in first degree relative 04/10/2013    Plan: Patient to return to clinic in 4 weeks Plan repeat US scan Discuss VBAC information [facility] w/ labs Reviewed warning signs in pregnancy. Patient to call with concerns PRN. Reviewed triage location.  15 min spent with patient greater than 80% spent in counseling and coordination of care.    Aarianna Hoadley Wilson SingerWren CNM

## 2013-09-08 NOTE — Progress Notes (Signed)
Pulse:92 Patient states she is having sharp pains in her abdomen. Patient would like to know if she wanted a breast pump would medicaid cover that.

## 2013-10-06 ENCOUNTER — Other Ambulatory Visit: Payer: Medicaid Other

## 2013-10-06 ENCOUNTER — Encounter: Payer: Medicaid Other | Admitting: Advanced Practice Midwife

## 2013-10-15 ENCOUNTER — Other Ambulatory Visit: Payer: Medicaid Other

## 2013-10-15 ENCOUNTER — Ambulatory Visit: Payer: Medicaid Other | Admitting: Obstetrics

## 2013-10-15 VITALS — BP 118/67 | Temp 97.3°F | Wt 196.0 lb

## 2013-10-15 DIAGNOSIS — Z3480 Encounter for supervision of other normal pregnancy, unspecified trimester: Secondary | ICD-10-CM

## 2013-10-15 LAB — CBC
HEMATOCRIT: 34.2 % — AB (ref 36.0–46.0)
HEMOGLOBIN: 11.6 g/dL — AB (ref 12.0–15.0)
MCH: 31 pg (ref 26.0–34.0)
MCHC: 33.9 g/dL (ref 30.0–36.0)
MCV: 91.4 fL (ref 78.0–100.0)
Platelets: 206 10*3/uL (ref 150–400)
RBC: 3.74 MIL/uL — ABNORMAL LOW (ref 3.87–5.11)
RDW: 13.1 % (ref 11.5–15.5)
WBC: 6.5 10*3/uL (ref 4.0–10.5)

## 2013-10-15 LAB — POCT URINALYSIS DIPSTICK
BILIRUBIN UA: NEGATIVE
Glucose, UA: NEGATIVE
KETONES UA: NEGATIVE
LEUKOCYTES UA: NEGATIVE
Nitrite, UA: NEGATIVE
PH UA: 6
Spec Grav, UA: 1.015
Urobilinogen, UA: NEGATIVE

## 2013-10-15 NOTE — Progress Notes (Signed)
Pulse- 80 Patient states she is having some lower abdomen pain.

## 2013-10-16 LAB — RPR

## 2013-10-16 LAB — HIV ANTIBODY (ROUTINE TESTING W REFLEX): HIV: NONREACTIVE

## 2013-10-16 LAB — GLUCOSE TOLERANCE, 2 HOURS W/ 1HR
GLUCOSE, 2 HOUR: 96 mg/dL (ref 70–139)
Glucose, 1 hour: 122 mg/dL (ref 70–170)
Glucose, Fasting: 51 mg/dL — ABNORMAL LOW (ref 70–99)

## 2013-10-26 ENCOUNTER — Encounter: Payer: Self-pay | Admitting: Obstetrics

## 2013-10-30 ENCOUNTER — Encounter: Payer: Medicaid Other | Admitting: Advanced Practice Midwife

## 2013-11-10 ENCOUNTER — Other Ambulatory Visit: Payer: Self-pay | Admitting: Advanced Practice Midwife

## 2013-11-10 DIAGNOSIS — Z3480 Encounter for supervision of other normal pregnancy, unspecified trimester: Secondary | ICD-10-CM

## 2013-11-13 ENCOUNTER — Other Ambulatory Visit: Payer: Self-pay | Admitting: Advanced Practice Midwife

## 2013-11-13 ENCOUNTER — Encounter: Payer: Medicaid Other | Admitting: Advanced Practice Midwife

## 2013-11-13 ENCOUNTER — Ambulatory Visit (INDEPENDENT_AMBULATORY_CARE_PROVIDER_SITE_OTHER): Payer: Medicaid Other | Admitting: Advanced Practice Midwife

## 2013-11-13 ENCOUNTER — Encounter: Payer: Self-pay | Admitting: Advanced Practice Midwife

## 2013-11-13 ENCOUNTER — Ambulatory Visit (HOSPITAL_COMMUNITY)
Admission: RE | Admit: 2013-11-13 | Discharge: 2013-11-13 | Disposition: A | Discharge status: Home / Self Care | Payer: Medicaid Other | Source: Ambulatory Visit | Attending: Advanced Practice Midwife | Admitting: Advanced Practice Midwife

## 2013-11-13 VITALS — BP 122/82 | HR 89 | Temp 98.6°F

## 2013-11-13 DIAGNOSIS — Z3480 Encounter for supervision of other normal pregnancy, unspecified trimester: Secondary | ICD-10-CM

## 2013-11-13 DIAGNOSIS — Z3689 Encounter for other specified antenatal screening: Secondary | ICD-10-CM | POA: Insufficient documentation

## 2013-11-13 DIAGNOSIS — O409XX0 Polyhydramnios, unspecified trimester, not applicable or unspecified: Secondary | ICD-10-CM | POA: Insufficient documentation

## 2013-11-13 LAB — POCT URINALYSIS DIPSTICK
Bilirubin, UA: NEGATIVE
GLUCOSE UA: NEGATIVE
Ketones, UA: NEGATIVE
Leukocytes, UA: NEGATIVE
NITRITE UA: NEGATIVE
RBC UA: 250
Spec Grav, UA: 1.015
Urobilinogen, UA: 1
pH, UA: 6

## 2013-11-13 NOTE — Progress Notes (Signed)
Subjective: Julia CantorLisa Fowler is a 28 y.o. at 29 weeks by LMP  Patient denies vaginal leaking of fluid or bleeding, denies contractions.  Reports positive fetal movment.  Denies concerns today.  Objective: Filed Vitals:   11/13/13 1056  BP: 122/82  Pulse: 89  Temp: 98.6 F (37 C)   140 FHR 29 Fundal Height Fetal Position cephalic  Assessment: Patient Active Problem List   Diagnosis Date Noted  . History of C-section 07/14/2013  . Hypothyroid 04/10/2013  . BMI 37.0-37.9, adult 04/10/2013  . Rickets 04/10/2013  . Family history of breast cancer in first degree relative 04/10/2013    Plan: Patient to return to clinic in 2 weeks Reviewed warning signs in pregnancy. Patient to call with concerns PRN. Reviewed triage location. VBAC success rate 33.2%. Discuss and counsel patient @ [facility]. Patient currently remains undecided on VBAC for scheduled C-section.  Kedrick Mcnamee Wilson SingerWren CNM

## 2013-11-27 ENCOUNTER — Ambulatory Visit (INDEPENDENT_AMBULATORY_CARE_PROVIDER_SITE_OTHER): Payer: Medicaid Other | Admitting: Advanced Practice Midwife

## 2013-11-27 ENCOUNTER — Encounter: Payer: Self-pay | Admitting: Advanced Practice Midwife

## 2013-11-27 VITALS — BP 112/70 | HR 81 | Temp 97.9°F | Wt 201.0 lb

## 2013-11-27 DIAGNOSIS — Z3480 Encounter for supervision of other normal pregnancy, unspecified trimester: Secondary | ICD-10-CM

## 2013-11-27 LAB — TSH: TSH: 2.817 u[IU]/mL (ref 0.350–4.500)

## 2013-11-27 NOTE — Progress Notes (Signed)
Subjective: Caraleigh Eakle is a 28 y.o. at 31 weeks by LMP  Patient denies vaginal leaking of fluid or bleeding, denies contractions.  Reports positive fetal movment.  Denies concerns today. Patient reports she desires repeat c-section.  Objective: Filed Vitals:   11/27/13 1030  BP: 112/70  Pulse: 81  Temp: 97.9 F (36.6 C)   140 FHR 29 Fundal Height Fetal Position unknown  Assessment: Patient Active Problem List   Diagnosis Date Noted  . Polyhydramnios 11/13/2013  . History of C-section 07/14/2013  . Hypothyroid 04/10/2013  . BMI 37.0-37.9, adult 04/10/2013  . Rickets 04/10/2013  . Family history of breast cancer in first degree relative 04/10/2013    Plan: Patient to return to clinic in 2 weeks Plan to meet w/ MD for visit before scheduled c-section TSH today due to hx of hypothyroid. Repeat US next week, scheduled. Discussed polyhdraminos today and last [place] results.. Encouraged [facility] daily. Reviewed warning signs in pregnancy. Patient to call with concerns PRN. Reviewed triage location.   Emaree Chiu Wilson Singer CNM

## 2013-12-01 ENCOUNTER — Other Ambulatory Visit: Payer: Self-pay | Admitting: Advanced Practice Midwife

## 2013-12-01 ENCOUNTER — Telehealth: Payer: Self-pay | Admitting: *Deleted

## 2013-12-01 DIAGNOSIS — Z3480 Encounter for supervision of other normal pregnancy, unspecified trimester: Secondary | ICD-10-CM

## 2013-12-01 DIAGNOSIS — R102 Pelvic and perineal pain: Principal | ICD-10-CM

## 2013-12-01 DIAGNOSIS — O26899 Other specified pregnancy related conditions, unspecified trimester: Secondary | ICD-10-CM

## 2013-12-01 LAB — POCT URINALYSIS DIPSTICK
Bilirubin, UA: NEGATIVE
Glucose, UA: NEGATIVE
Leukocytes, UA: NEGATIVE
Nitrite, UA: NEGATIVE
Spec Grav, UA: 1.02
UROBILINOGEN UA: 0.2
pH, UA: 5

## 2013-12-01 NOTE — Telephone Encounter (Signed)
Patient calling for Julia Fowler- wants to know if the Rx discussed at visit on Friday was sent to her pharmacy.

## 2013-12-01 NOTE — Telephone Encounter (Signed)
Patient needs a maternity belly band. Will try to get this in the computer once I have assistance. Patient aware of med supply co and number.

## 2013-12-04 ENCOUNTER — Ambulatory Visit (HOSPITAL_COMMUNITY)
Admission: RE | Admit: 2013-12-04 | Discharge: 2013-12-04 | Disposition: A | Discharge status: Home / Self Care | Payer: Medicaid Other | Source: Ambulatory Visit | Attending: Advanced Practice Midwife | Admitting: Advanced Practice Midwife

## 2013-12-04 ENCOUNTER — Ambulatory Visit (HOSPITAL_COMMUNITY): Payer: Medicaid Other

## 2013-12-04 DIAGNOSIS — O409XX0 Polyhydramnios, unspecified trimester, not applicable or unspecified: Secondary | ICD-10-CM

## 2013-12-04 DIAGNOSIS — Z3689 Encounter for other specified antenatal screening: Secondary | ICD-10-CM | POA: Insufficient documentation

## 2013-12-08 ENCOUNTER — Encounter: Payer: Self-pay | Admitting: Advanced Practice Midwife

## 2013-12-08 ENCOUNTER — Other Ambulatory Visit: Payer: Self-pay | Admitting: Advanced Practice Midwife

## 2013-12-08 DIAGNOSIS — O0990 Supervision of high risk pregnancy, unspecified, unspecified trimester: Secondary | ICD-10-CM

## 2013-12-08 DIAGNOSIS — O409XX0 Polyhydramnios, unspecified trimester, not applicable or unspecified: Secondary | ICD-10-CM

## 2013-12-11 ENCOUNTER — Ambulatory Visit (INDEPENDENT_AMBULATORY_CARE_PROVIDER_SITE_OTHER): Payer: Medicaid Other | Admitting: Advanced Practice Midwife

## 2013-12-11 ENCOUNTER — Encounter: Payer: Self-pay | Admitting: Advanced Practice Midwife

## 2013-12-11 VITALS — BP 134/84 | HR 89 | Temp 98.1°F | Wt 200.0 lb

## 2013-12-11 DIAGNOSIS — Z3480 Encounter for supervision of other normal pregnancy, unspecified trimester: Secondary | ICD-10-CM

## 2013-12-11 NOTE — Progress Notes (Signed)
Subjective: Kymesha Achorn is a 28 y.o. at 33 weeks by LMP, 19  Patient denies vaginal leaking of fluid or bleeding, denies contractions.  Reports positive fetal movment.  Denies concerns today.  Objective: Filed Vitals:   12/11/13 1148  BP: 134/84  Pulse:   Temp:    135 FHR 33 Fundal Height Fetal Position cephalic  NST 135 FHR, + accel, neg decel, mod variability, toco negative  Assessment: Patient Active Problem List   Diagnosis Date Noted  . Polyhydramnios 11/13/2013  . History of C-section 07/14/2013  . Hypothyroid 04/10/2013  . BMI 37.0-37.9, adult 04/10/2013  . Rickets 04/10/2013  . Family history of breast cancer in first degree relative 04/10/2013  Reactive and Reassuring NST  Plan: Patient to return to clinic in 1 weeks [place] next week Meet MD after next visit Schedule repeat c-section Cont biweekly NST and weekly AFI Reviewed [facility] and signs of active labor Reviewed warning signs in pregnancy. Patient to call with concerns PRN. Reviewed triage location.  Elray Dains Wilson Singer CNM

## 2013-12-14 LAB — POCT URINALYSIS DIPSTICK
RBC UA: 50
SPEC GRAV UA: 1.02
pH, UA: 6

## 2013-12-15 ENCOUNTER — Ambulatory Visit (INDEPENDENT_AMBULATORY_CARE_PROVIDER_SITE_OTHER): Payer: Medicaid Other

## 2013-12-15 ENCOUNTER — Encounter: Payer: Self-pay | Admitting: Advanced Practice Midwife

## 2013-12-15 ENCOUNTER — Ambulatory Visit (INDEPENDENT_AMBULATORY_CARE_PROVIDER_SITE_OTHER): Payer: Medicaid Other | Admitting: Advanced Practice Midwife

## 2013-12-15 VITALS — BP 113/76 | HR 92 | Temp 98.3°F | Wt 203.0 lb

## 2013-12-15 DIAGNOSIS — O0990 Supervision of high risk pregnancy, unspecified, unspecified trimester: Secondary | ICD-10-CM

## 2013-12-15 DIAGNOSIS — O409XX0 Polyhydramnios, unspecified trimester, not applicable or unspecified: Secondary | ICD-10-CM

## 2013-12-15 LAB — POCT URINALYSIS DIPSTICK
Bilirubin, UA: NEGATIVE
Blood, UA: 50
GLUCOSE UA: NEGATIVE
Ketones, UA: NEGATIVE
NITRITE UA: NEGATIVE
Protein, UA: NEGATIVE
Spec Grav, UA: 1.015
UROBILINOGEN UA: NEGATIVE
pH, UA: 6

## 2013-12-15 LAB — US OB FOLLOW UP

## 2013-12-15 NOTE — Progress Notes (Signed)
Subjective: Julia Fowler is a 28 y.o. at 33 weeks by LMP, 19  Patient denies vaginal leaking of fluid or bleeding, denies contractions.  Reports positive fetal movment.  Denies concerns today.  Objective: Filed Vitals:   12/15/13 1012  BP: 113/76  Pulse: 92  Temp: 98.3 F (36.8 C)   140 FHR 33 Fundal Height Fetal Position unknown  NST 140 + accel, negative decel, mod variability Toco: Negative  Assessment: Patient Active Problem List   Diagnosis Date Noted  . Polyhydramnios 11/13/2013  . History of C-section 07/14/2013  . Hypothyroid 04/10/2013  . BMI 37.0-37.9, adult 04/10/2013  . Rickets 04/10/2013  . Family history of breast cancer in first degree relative 04/10/2013  NST reactive  Plan: Patient to return to clinic in 1 weeks Patient desires to meet w/ OB prior to c-section AFI today, reactive NST NST pending on friday Reviewed warning signs in pregnancy. Patient to call with concerns PRN. Reviewed triage location.  Chrystal Zeimet Wilson Singer CNM

## 2013-12-15 NOTE — Addendum Note (Signed)
Addended by: Marya Landry D on: 12/15/2013 04:12 PM   Modules accepted: Orders

## 2013-12-18 ENCOUNTER — Ambulatory Visit (INDEPENDENT_AMBULATORY_CARE_PROVIDER_SITE_OTHER): Payer: Medicaid Other | Admitting: Advanced Practice Midwife

## 2013-12-18 VITALS — BP 123/77 | HR 73 | Temp 98.0°F | Wt 200.0 lb

## 2013-12-18 DIAGNOSIS — O409XX0 Polyhydramnios, unspecified trimester, not applicable or unspecified: Secondary | ICD-10-CM

## 2013-12-18 DIAGNOSIS — Z3480 Encounter for supervision of other normal pregnancy, unspecified trimester: Secondary | ICD-10-CM

## 2013-12-18 LAB — POCT URINALYSIS DIPSTICK
Bilirubin, UA: NEGATIVE
Blood, UA: 50
Glucose, UA: NEGATIVE
Ketones, UA: NEGATIVE
Leukocytes, UA: NEGATIVE
Nitrite, UA: NEGATIVE
PH UA: 6
SPEC GRAV UA: 1.015
UROBILINOGEN UA: NEGATIVE

## 2013-12-18 NOTE — Progress Notes (Signed)
Patient here for NST today. FHR 125, +accel, neg decel, mod variability Toco: irregular contractions  Reactive and Reassuring. ROB, biweekly NST and weekly AFI scheduled next week. Attempting to obtain AFI results from earlier this week.  Luverne Zerkle Wilson SingerWren CNM

## 2013-12-22 ENCOUNTER — Encounter: Payer: Medicaid Other | Admitting: Advanced Practice Midwife

## 2013-12-22 ENCOUNTER — Other Ambulatory Visit: Payer: Medicaid Other

## 2013-12-23 ENCOUNTER — Other Ambulatory Visit: Payer: Self-pay | Admitting: *Deleted

## 2013-12-23 ENCOUNTER — Encounter: Payer: Medicaid Other | Admitting: Obstetrics

## 2013-12-23 ENCOUNTER — Other Ambulatory Visit: Payer: Self-pay | Admitting: Obstetrics & Gynecology

## 2013-12-23 ENCOUNTER — Ambulatory Visit (INDEPENDENT_AMBULATORY_CARE_PROVIDER_SITE_OTHER): Payer: Medicaid Other

## 2013-12-23 DIAGNOSIS — O409XX0 Polyhydramnios, unspecified trimester, not applicable or unspecified: Secondary | ICD-10-CM

## 2013-12-23 DIAGNOSIS — O3660X0 Maternal care for excessive fetal growth, unspecified trimester, not applicable or unspecified: Secondary | ICD-10-CM

## 2013-12-23 LAB — US OB LIMITED

## 2013-12-25 ENCOUNTER — Encounter: Payer: Medicaid Other | Admitting: Advanced Practice Midwife

## 2013-12-25 ENCOUNTER — Ambulatory Visit (INDEPENDENT_AMBULATORY_CARE_PROVIDER_SITE_OTHER): Payer: Medicaid Other | Admitting: Advanced Practice Midwife

## 2013-12-25 VITALS — BP 128/78 | HR 88 | Temp 98.5°F | Wt 201.0 lb

## 2013-12-25 DIAGNOSIS — Z3480 Encounter for supervision of other normal pregnancy, unspecified trimester: Secondary | ICD-10-CM

## 2013-12-25 DIAGNOSIS — Z3483 Encounter for supervision of other normal pregnancy, third trimester: Secondary | ICD-10-CM

## 2013-12-25 DIAGNOSIS — O403XX0 Polyhydramnios, third trimester, not applicable or unspecified: Secondary | ICD-10-CM

## 2013-12-25 DIAGNOSIS — O409XX0 Polyhydramnios, unspecified trimester, not applicable or unspecified: Secondary | ICD-10-CM

## 2013-12-25 DIAGNOSIS — Z789 Other specified health status: Secondary | ICD-10-CM

## 2013-12-25 LAB — POCT URINALYSIS DIPSTICK
Bilirubin, UA: NEGATIVE
Blood, UA: 50
GLUCOSE UA: NEGATIVE
KETONES UA: NEGATIVE
LEUKOCYTES UA: NEGATIVE
Nitrite, UA: NEGATIVE
Spec Grav, UA: 1.015
Urobilinogen, UA: NEGATIVE
pH, UA: 6

## 2013-12-25 NOTE — Progress Notes (Signed)
Subjective: Julia CantorLisa Fowler is a 28 y.o. at 35 weeks by LMP, 19  Patient denies vaginal leaking of fluid or bleeding, denies contractions.  Reports positive fetal movment.  Denies concerns today. [facility] Expecting a girl "Logan". Ready for delivery. Would like the opportunity to meed the MDs prior to delivery. Desires repeat c-section.  Objective: Filed Vitals:   12/25/13 1131  BP: 128/78  Pulse: 88  Temp: 98.5 F (36.9 C)   155 FHR 35 Fundal Height Fetal Position cephalic  NST FHR 155, + accel, negative decel, mod variability Toco irregular  Assessment: Patient Active Problem List   Diagnosis Date Noted  . Polyhydramnios 11/13/2013  . History of C-section 07/14/2013  . Hypothyroid 04/10/2013  . BMI 37.0-37.9, adult 04/10/2013  . Rickets 04/10/2013  . Family history of breast cancer in first degree relative 04/10/2013  Reactive NST Mild Poly (US review AFI 28 on 1/7)  Plan: Patient to return to clinic in early next week.  Reviewed warning signs in pregnancy. Patient to call with concerns PRN. Reviewed triage location. US next week for AFI and Growth Biweekly NSTs and weekly AFI Plan c-section @ 39 weeks unless change in status.  GBS today, review [place] Breast Pump Rx given  Dory HornAmy Hera Celaya CNM

## 2013-12-25 NOTE — Addendum Note (Signed)
Addended by: Marya LandryFOSTER, SUZANNE D on: 12/25/2013 01:28 PM   Modules accepted: Orders

## 2013-12-28 ENCOUNTER — Encounter: Payer: Self-pay | Admitting: Obstetrics & Gynecology

## 2013-12-28 DIAGNOSIS — IMO0002 Reserved for concepts with insufficient information to code with codable children: Secondary | ICD-10-CM | POA: Insufficient documentation

## 2013-12-29 ENCOUNTER — Other Ambulatory Visit: Payer: Medicaid Other

## 2013-12-29 ENCOUNTER — Encounter: Payer: Medicaid Other | Admitting: Advanced Practice Midwife

## 2013-12-29 LAB — STREP B DNA PROBE: GBSP: NOT DETECTED

## 2013-12-30 ENCOUNTER — Encounter: Payer: Self-pay | Admitting: Obstetrics & Gynecology

## 2013-12-30 ENCOUNTER — Ambulatory Visit (HOSPITAL_COMMUNITY)
Admission: RE | Admit: 2013-12-30 | Discharge: 2013-12-30 | Disposition: A | Discharge status: Home / Self Care | Payer: Medicaid Other | Source: Ambulatory Visit | Attending: Advanced Practice Midwife | Admitting: Advanced Practice Midwife

## 2013-12-30 ENCOUNTER — Other Ambulatory Visit: Payer: Medicaid Other

## 2013-12-30 ENCOUNTER — Encounter: Payer: Medicaid Other | Admitting: Obstetrics

## 2013-12-30 ENCOUNTER — Encounter (HOSPITAL_COMMUNITY): Payer: Self-pay

## 2013-12-30 ENCOUNTER — Ambulatory Visit (INDEPENDENT_AMBULATORY_CARE_PROVIDER_SITE_OTHER): Payer: Medicaid Other | Admitting: Obstetrics & Gynecology

## 2013-12-30 ENCOUNTER — Encounter: Payer: Medicaid Other | Admitting: Obstetrics & Gynecology

## 2013-12-30 VITALS — BP 136/89 | HR 97 | Wt 201.5 lb

## 2013-12-30 VITALS — BP 127/74 | HR 83 | Temp 97.1°F | Wt 202.0 lb

## 2013-12-30 DIAGNOSIS — O99891 Other specified diseases and conditions complicating pregnancy: Secondary | ICD-10-CM | POA: Insufficient documentation

## 2013-12-30 DIAGNOSIS — Z3480 Encounter for supervision of other normal pregnancy, unspecified trimester: Secondary | ICD-10-CM

## 2013-12-30 DIAGNOSIS — E550 Rickets, active: Secondary | ICD-10-CM | POA: Insufficient documentation

## 2013-12-30 DIAGNOSIS — O9989 Other specified diseases and conditions complicating pregnancy, childbirth and the puerperium: Secondary | ICD-10-CM

## 2013-12-30 DIAGNOSIS — O403XX1 Polyhydramnios, third trimester, fetus 1: Secondary | ICD-10-CM

## 2013-12-30 DIAGNOSIS — O3090 Multiple gestation, unspecified, unspecified trimester: Secondary | ICD-10-CM

## 2013-12-30 DIAGNOSIS — Z3689 Encounter for other specified antenatal screening: Secondary | ICD-10-CM | POA: Insufficient documentation

## 2013-12-30 DIAGNOSIS — Z98891 History of uterine scar from previous surgery: Secondary | ICD-10-CM

## 2013-12-30 DIAGNOSIS — O409XX0 Polyhydramnios, unspecified trimester, not applicable or unspecified: Secondary | ICD-10-CM

## 2013-12-30 DIAGNOSIS — IMO0002 Reserved for concepts with insufficient information to code with codable children: Secondary | ICD-10-CM

## 2013-12-30 DIAGNOSIS — Z3483 Encounter for supervision of other normal pregnancy, third trimester: Secondary | ICD-10-CM

## 2013-12-30 NOTE — Progress Notes (Signed)
[facility] ultrasound  Indication: 28 yr old G3P1011 at 5652w0d with ricketts and polyhydramnios for fetal growth.  Findings: 1. Single intrauterine pregnancy. 2. Estimated fetal weight is in the 83rd%.  3. Anterior placenta without evidence of previa. 4. Normal amniotic fluid index; although increased for gestational age. 5. The limited anatomy survey is normal.  Recommendations: 1. Appropriate fetal growth. 2. Increased amniotic fluid volume: - recommend continue antenatal testing and weekly AFI - recommend delivery by estimated due date but not prior to 39 weeks in the absence of other complications  Eulis FosterKristen Quinn, MD

## 2013-12-31 LAB — POCT URINALYSIS DIPSTICK
Bilirubin, UA: NEGATIVE
Blood, UA: NEGATIVE
Glucose, UA: NEGATIVE
KETONES UA: NEGATIVE
LEUKOCYTES UA: NEGATIVE
NITRITE UA: NEGATIVE
Spec Grav, UA: 1.01
Urobilinogen, UA: NEGATIVE
pH, UA: 5

## 2013-12-31 NOTE — Progress Notes (Signed)
Subjective:    Julia Fowler is a 28 y.o. female being seen today for her obstetrical visit. She is at 7554w0d gestation. Patient reports no complaints. Fetal movement: normal.  Problem List Items Addressed This Visit   Polyhydramnios - Primary   Relevant Orders      Fetal non-stress test    Other Visit Diagnoses   Encounter for supervision of other normal pregnancy in third trimester        Relevant Orders       POCT urinalysis dipstick (Completed)      Patient Active Problem List   Diagnosis Date Noted  . Excessive fetal growth 12/28/2013  . Polyhydramnios 11/13/2013  . History of C-section 07/14/2013  . Hypothyroid 04/10/2013  . BMI 37.0-37.9, adult 04/10/2013  . Rickets 04/10/2013  . Family history of breast cancer in first degree relative 04/10/2013   Objective:    BP 127/74  Pulse 83  Temp(Src) 97.1 F (36.2 C)  Wt 91.627 kg (202 lb)  LMP 04/22/2013 FHT:  140 BPM  Uterine Size: size equals dates  Presentation: cephalic     Assessment:    Pregnancy @ 6954w0d weeks   Plan:    labs reviewed, problem list updated  Orders Placed This Encounter  Procedures  . Fetal non-stress test    Standing Status: Standing     Number of Occurrences: 1     Standing Expiration Date:   . POCT urinalysis dipstick    Follow up in 1 Week.

## 2014-01-01 ENCOUNTER — Ambulatory Visit (INDEPENDENT_AMBULATORY_CARE_PROVIDER_SITE_OTHER): Payer: Medicaid Other | Admitting: Advanced Practice Midwife

## 2014-01-01 VITALS — BP 125/78 | HR 90 | Temp 98.1°F | Wt 203.0 lb

## 2014-01-01 DIAGNOSIS — Z3480 Encounter for supervision of other normal pregnancy, unspecified trimester: Secondary | ICD-10-CM

## 2014-01-01 DIAGNOSIS — O3090 Multiple gestation, unspecified, unspecified trimester: Secondary | ICD-10-CM

## 2014-01-01 DIAGNOSIS — Z3483 Encounter for supervision of other normal pregnancy, third trimester: Secondary | ICD-10-CM

## 2014-01-01 DIAGNOSIS — O409XX0 Polyhydramnios, unspecified trimester, not applicable or unspecified: Secondary | ICD-10-CM

## 2014-01-01 DIAGNOSIS — O409XX1 Polyhydramnios, unspecified trimester, fetus 1: Secondary | ICD-10-CM

## 2014-01-01 NOTE — Progress Notes (Signed)
Patient here for biweekly NST secondary to polyhydraminos.  FHR 140, + accel, neg decel, mod variability Toco: Negative Category 1  RTC next week. Schedule C-section  Had ROB earlier this week.   Amy Wilson SingerWren CNM

## 2014-01-05 ENCOUNTER — Ambulatory Visit (INDEPENDENT_AMBULATORY_CARE_PROVIDER_SITE_OTHER): Payer: Medicaid Other | Admitting: Obstetrics

## 2014-01-05 ENCOUNTER — Other Ambulatory Visit: Payer: Medicaid Other

## 2014-01-05 ENCOUNTER — Encounter: Payer: Medicaid Other | Admitting: Advanced Practice Midwife

## 2014-01-05 ENCOUNTER — Encounter: Payer: Self-pay | Admitting: Obstetrics

## 2014-01-05 ENCOUNTER — Other Ambulatory Visit: Payer: Self-pay | Admitting: *Deleted

## 2014-01-05 VITALS — BP 133/80 | HR 93 | Temp 97.6°F | Wt 202.0 lb

## 2014-01-05 DIAGNOSIS — O409XX0 Polyhydramnios, unspecified trimester, not applicable or unspecified: Secondary | ICD-10-CM

## 2014-01-05 DIAGNOSIS — Z3480 Encounter for supervision of other normal pregnancy, unspecified trimester: Secondary | ICD-10-CM

## 2014-01-05 DIAGNOSIS — O403XX1 Polyhydramnios, third trimester, fetus 1: Secondary | ICD-10-CM

## 2014-01-05 DIAGNOSIS — Z3483 Encounter for supervision of other normal pregnancy, third trimester: Secondary | ICD-10-CM

## 2014-01-05 LAB — US OB LIMITED

## 2014-01-05 NOTE — Progress Notes (Signed)
Subjective:    Julia CantorLisa Fowler is a 28 y.o. female being seen today for her obstetrical visit. She is at 4313w6d gestation. Patient reports no complaints. Fetal movement: normal.  Problem List Items Addressed This Visit   None    Visit Diagnoses   Encounter for supervision of other normal pregnancy in third trimester    -  Primary    Relevant Orders       POCT urinalysis dipstick       Fetal non-stress test      Patient Active Problem List   Diagnosis Date Noted  . Excessive fetal growth 12/28/2013  . Polyhydramnios 11/13/2013  . History of C-section 07/14/2013  . Hypothyroid 04/10/2013  . BMI 37.0-37.9, adult 04/10/2013  . Rickets 04/10/2013  . Family history of breast cancer in first degree relative 04/10/2013   Objective:    BP 133/80  Pulse 93  Temp(Src) 97.6 F (36.4 C)  Wt 202 lb (91.627 kg)  LMP 04/22/2013 FHT:  140 BPM  Uterine Size: size greater than dates  Presentation: unsure     Assessment:    Pregnancy @ 4813w6d weeks   Plan:     labs reviewed, problem list updated Consent signed. GBS sent TDAP offered  Rhogam given for RH negative Pediatrician: discussed. Infant feeding: plans to breastfeed. Maternity leave: discussed. Cigarette smoking: never smoked. Orders Placed This Encounter  Procedures  . Fetal non-stress test    Standing Status: Standing     Number of Occurrences: 1     Standing Expiration Date:   . POCT urinalysis dipstick   No orders of the defined types were placed in this encounter.   Follow up in twice weekly.

## 2014-01-05 NOTE — Progress Notes (Signed)
Patient reports AFI level 22 today

## 2014-01-07 ENCOUNTER — Other Ambulatory Visit: Payer: Self-pay | Admitting: *Deleted

## 2014-01-07 DIAGNOSIS — O403XX1 Polyhydramnios, third trimester, fetus 1: Secondary | ICD-10-CM

## 2014-01-08 ENCOUNTER — Encounter (HOSPITAL_COMMUNITY): Payer: Self-pay | Admitting: Pharmacist

## 2014-01-11 ENCOUNTER — Other Ambulatory Visit: Payer: Self-pay | Admitting: *Deleted

## 2014-01-11 LAB — POCT URINALYSIS DIPSTICK
Bilirubin, UA: NEGATIVE
GLUCOSE UA: NEGATIVE
Ketones, UA: NEGATIVE
Leukocytes, UA: NEGATIVE
NITRITE UA: NEGATIVE
SPEC GRAV UA: 1.01
Urobilinogen, UA: NEGATIVE
pH, UA: 5

## 2014-01-12 ENCOUNTER — Ambulatory Visit (INDEPENDENT_AMBULATORY_CARE_PROVIDER_SITE_OTHER): Payer: Medicaid Other

## 2014-01-12 ENCOUNTER — Ambulatory Visit (INDEPENDENT_AMBULATORY_CARE_PROVIDER_SITE_OTHER): Payer: Medicaid Other | Admitting: Advanced Practice Midwife

## 2014-01-12 ENCOUNTER — Encounter: Payer: Self-pay | Admitting: Advanced Practice Midwife

## 2014-01-12 VITALS — BP 121/80 | HR 106 | Temp 98.1°F | Wt 200.0 lb

## 2014-01-12 DIAGNOSIS — O409XX0 Polyhydramnios, unspecified trimester, not applicable or unspecified: Secondary | ICD-10-CM

## 2014-01-12 DIAGNOSIS — O403XX1 Polyhydramnios, third trimester, fetus 1: Secondary | ICD-10-CM

## 2014-01-12 DIAGNOSIS — Z3483 Encounter for supervision of other normal pregnancy, third trimester: Secondary | ICD-10-CM

## 2014-01-12 LAB — US OB LIMITED

## 2014-01-12 NOTE — Progress Notes (Signed)
Subjective: Julia Fowler is a 28 y.o. at 37 weeks by LMP, 19  Patient denies vaginal leaking of fluid or bleeding, denies contractions.  Reports positive fetal movment.  Denies concerns today. Ready for delivery. [facility].  Objective: Filed Vitals:   01/12/14 1000  BP: 121/80  Pulse: 106  Temp: 98.1 F (36.7 C)   140 FHR 37 Fundal Height Fetal Position cephalic  NST FHR 135, + accel, negative decel Moderate variability Toco Negative  Assessment: Patient Active Problem List   Diagnosis Date Noted  . Excessive fetal growth 12/28/2013  . Polyhydramnios 11/13/2013  . History of C-section 07/14/2013  . Hypothyroid 04/10/2013  . BMI 37.0-37.9, adult 04/10/2013  . Rickets 04/10/2013  . Family history of breast cancer in first degree relative 04/10/2013  Category 1 NST, reactive  Plan: Patient to return to clinic on Friday, AFI and NST next Tuesday before the C-section Gave pediatrician handout Considering Mi[facility] Reviewed warning signs in pregnancy. Patient to call with concerns PRN. Reviewed triage location.   Diar Berkel Wilson SingerWren CNM

## 2014-01-13 ENCOUNTER — Other Ambulatory Visit: Payer: Self-pay | Admitting: *Deleted

## 2014-01-13 DIAGNOSIS — O403XX1 Polyhydramnios, third trimester, fetus 1: Secondary | ICD-10-CM

## 2014-01-13 LAB — POCT URINALYSIS DIPSTICK
Bilirubin, UA: NEGATIVE
Glucose, UA: NEGATIVE
Ketones, UA: NEGATIVE
Leukocytes, UA: NEGATIVE
Nitrite, UA: NEGATIVE
RBC UA: 50
Spec Grav, UA: 1.015
Urobilinogen, UA: NEGATIVE
pH, UA: 6

## 2014-01-15 ENCOUNTER — Ambulatory Visit (INDEPENDENT_AMBULATORY_CARE_PROVIDER_SITE_OTHER): Payer: Medicaid Other | Admitting: Advanced Practice Midwife

## 2014-01-15 ENCOUNTER — Encounter: Payer: Self-pay | Admitting: Advanced Practice Midwife

## 2014-01-15 VITALS — BP 128/80 | HR 90 | Temp 98.3°F | Wt 199.0 lb

## 2014-01-15 DIAGNOSIS — Z3483 Encounter for supervision of other normal pregnancy, third trimester: Secondary | ICD-10-CM

## 2014-01-15 DIAGNOSIS — O409XX0 Polyhydramnios, unspecified trimester, not applicable or unspecified: Secondary | ICD-10-CM

## 2014-01-15 DIAGNOSIS — Z3480 Encounter for supervision of other normal pregnancy, unspecified trimester: Secondary | ICD-10-CM

## 2014-01-15 NOTE — Progress Notes (Signed)
NST for mild polyhydraminos at term  FHR 140, +accel, negative decel Moderate Variability Toco: Negative  Meklit Cotta Wilson SingerWren CNM

## 2014-01-19 ENCOUNTER — Encounter: Payer: Self-pay | Admitting: Advanced Practice Midwife

## 2014-01-19 ENCOUNTER — Other Ambulatory Visit: Payer: Medicaid Other

## 2014-01-19 ENCOUNTER — Ambulatory Visit (HOSPITAL_COMMUNITY): Payer: Medicaid Other

## 2014-01-19 ENCOUNTER — Ambulatory Visit (INDEPENDENT_AMBULATORY_CARE_PROVIDER_SITE_OTHER): Payer: Medicaid Other | Admitting: Advanced Practice Midwife

## 2014-01-19 ENCOUNTER — Other Ambulatory Visit (INDEPENDENT_AMBULATORY_CARE_PROVIDER_SITE_OTHER): Payer: Medicaid Other

## 2014-01-19 VITALS — BP 122/82 | HR 94 | Temp 98.3°F | Wt 200.0 lb

## 2014-01-19 DIAGNOSIS — O409XX0 Polyhydramnios, unspecified trimester, not applicable or unspecified: Secondary | ICD-10-CM

## 2014-01-19 DIAGNOSIS — O403XX1 Polyhydramnios, third trimester, fetus 1: Secondary | ICD-10-CM

## 2014-01-19 DIAGNOSIS — O3090 Multiple gestation, unspecified, unspecified trimester: Secondary | ICD-10-CM

## 2014-01-19 DIAGNOSIS — Z3483 Encounter for supervision of other normal pregnancy, third trimester: Secondary | ICD-10-CM

## 2014-01-19 LAB — POCT URINALYSIS DIPSTICK
BILIRUBIN UA: NEGATIVE
Bilirubin, UA: NEGATIVE
GLUCOSE UA: NEGATIVE
Glucose, UA: NEGATIVE
KETONES UA: NEGATIVE
Ketones, UA: NEGATIVE
Leukocytes, UA: NEGATIVE
Leukocytes, UA: NEGATIVE
NITRITE UA: NEGATIVE
Nitrite, UA: NEGATIVE
PH UA: 6.5
RBC UA: 50
SPEC GRAV UA: 1.015
Spec Grav, UA: 1.01
Urobilinogen, UA: NEGATIVE
Urobilinogen, UA: NEGATIVE
pH, UA: 6

## 2014-01-19 LAB — US OB LIMITED

## 2014-01-19 NOTE — Progress Notes (Signed)
Subjective: Julia Fowler is a 28 y.o. at 38 weeks by LMP, 19 wk US  Patient denies vaginal leaking of fluid or bleeding, denies contractions.  Reports positive fetal movment.  Denies concerns today. Ready for delivery.   Objective: Filed Vitals:   01/19/14 0941  BP: 122/82  Pulse: 94  Temp: 98.3 F (36.8 C)   140 FHR 38 Fundal Height Fetal Position cephalic  FHR 140, + accel, negative decel, mod variability Toco: Negative  Assessment: Patient Active Problem List   Diagnosis Date Noted  . Excessive fetal growth 12/28/2013  . Polyhydramnios 11/13/2013  . History of C-section 07/14/2013  . Hypothyroid 04/10/2013  . BMI 37.0-37.9, adult 04/10/2013  . Rickets 04/10/2013  . Family history of breast cancer in first degree relative 04/10/2013    Plan: Scheduled c-section on Thursday RTC PRN Reviewed warning signs in pregnancy. Patient to call with concerns PRN. Reviewed triage location. BRF Planning Nexplanon PP  Julia Fowler CNM

## 2014-01-20 ENCOUNTER — Encounter (HOSPITAL_COMMUNITY): Payer: Self-pay

## 2014-01-20 ENCOUNTER — Encounter (HOSPITAL_COMMUNITY)
Admission: RE | Admit: 2014-01-20 | Discharge: 2014-01-20 | Disposition: A | Discharge status: Home / Self Care | Payer: Medicaid Other | Source: Ambulatory Visit | Attending: Obstetrics | Admitting: Obstetrics

## 2014-01-20 HISTORY — DX: Hypothyroidism, unspecified: E03.9

## 2014-01-20 LAB — TYPE AND SCREEN
ABO/RH(D): A POS
Antibody Screen: NEGATIVE

## 2014-01-20 LAB — CBC
HCT: 35.5 % — ABNORMAL LOW (ref 36.0–46.0)
Hemoglobin: 12.1 g/dL (ref 12.0–15.0)
MCH: 32.1 pg (ref 26.0–34.0)
MCHC: 34.1 g/dL (ref 30.0–36.0)
MCV: 94.2 fL (ref 78.0–100.0)
Platelets: 195 10*3/uL (ref 150–400)
RBC: 3.77 MIL/uL — AB (ref 3.87–5.11)
RDW: 13.4 % (ref 11.5–15.5)
WBC: 7 10*3/uL (ref 4.0–10.5)

## 2014-01-20 LAB — ABO/RH: ABO/RH(D): A POS

## 2014-01-20 LAB — RPR

## 2014-01-20 NOTE — Patient Instructions (Signed)
20 [place] BingLisa R Kovack  01/20/2014   Your procedure is scheduled on:  01/21/14  [facility] AM.  Pick up the phone at the desk and dial [phone].   Call this number if you have problems the morning of surgery: [phone]   Remember:   Do not eat food:After Midnight.  Do not drink clear liquids: After Midnight.  Take these medicines the morning of surgery with A SIP OF WATER: NA   Do not wear jewelry, make-up or nail polish.  Do not wear lotions, powders, or perfumes. You may wear deodorant.  Do not shave 48 hours prior to surgery.  Do not bring valuables to the hospital.  [facility] not   responsible for any belongings or valuables brought to the hospital.  Contacts, dentures or bridgework may not be worn into surgery.  Leave suitcase in the car. After surgery it may be brought to your room.  For patients admitted to the hospital, checkout time is 11:00 AM the day of              discharge.   Patients discharged the day of surgery will not be allowed to drive             home.  Name and phone number of your driver: NA  Special Instructions:      Please read over the following fact sheets that you were given:   Surgical Site Infection Prevention

## 2014-01-21 ENCOUNTER — Encounter (HOSPITAL_COMMUNITY): Payer: Medicaid Other | Admitting: Anesthesiology

## 2014-01-21 ENCOUNTER — Encounter (HOSPITAL_COMMUNITY): Payer: Self-pay | Admitting: *Deleted

## 2014-01-21 ENCOUNTER — Inpatient Hospital Stay (HOSPITAL_COMMUNITY): Payer: Medicaid Other | Admitting: Anesthesiology

## 2014-01-21 ENCOUNTER — Encounter (HOSPITAL_COMMUNITY)
Admission: AD | Disposition: A | Discharge status: Home / Self Care | Payer: Self-pay | Source: Ambulatory Visit | Attending: Obstetrics

## 2014-01-21 ENCOUNTER — Inpatient Hospital Stay (HOSPITAL_COMMUNITY)
Admission: AD | Admit: 2014-01-21 | Discharge: 2014-01-24 | DRG: 766 | Disposition: A | Discharge status: Home / Self Care | Payer: Medicaid Other | Source: Ambulatory Visit | Attending: Obstetrics | Admitting: Obstetrics

## 2014-01-21 DIAGNOSIS — O34219 Maternal care for unspecified type scar from previous cesarean delivery: Secondary | ICD-10-CM | POA: Diagnosis present

## 2014-01-21 DIAGNOSIS — E039 Hypothyroidism, unspecified: Secondary | ICD-10-CM | POA: Diagnosis present

## 2014-01-21 DIAGNOSIS — Z6839 Body mass index (BMI) 39.0-39.9, adult: Secondary | ICD-10-CM

## 2014-01-21 DIAGNOSIS — E079 Disorder of thyroid, unspecified: Secondary | ICD-10-CM | POA: Diagnosis present

## 2014-01-21 DIAGNOSIS — O99214 Obesity complicating childbirth: Secondary | ICD-10-CM

## 2014-01-21 DIAGNOSIS — Z833 Family history of diabetes mellitus: Secondary | ICD-10-CM

## 2014-01-21 DIAGNOSIS — E669 Obesity, unspecified: Secondary | ICD-10-CM | POA: Diagnosis present

## 2014-01-21 DIAGNOSIS — O99284 Endocrine, nutritional and metabolic diseases complicating childbirth: Secondary | ICD-10-CM | POA: Diagnosis not present

## 2014-01-21 SURGERY — Surgical Case
Anesthesia: Spinal | Site: Abdomen

## 2014-01-21 MED ORDER — DIPHENHYDRAMINE HCL 50 MG/ML IJ SOLN: 25.0000 mg | INTRAMUSCULAR | Status: DC | PRN

## 2014-01-21 MED ORDER — DIBUCAINE 1 % RE OINT: 1.0000 "application " | TOPICAL_OINTMENT | RECTAL | Status: DC | PRN

## 2014-01-21 MED ORDER — MEASLES, MUMPS & RUBELLA VAC ~~LOC~~ INJ: 0.5000 mL | INJECTION | Freq: Once | SUBCUTANEOUS | Status: DC

## 2014-01-21 MED ORDER — IBUPROFEN 600 MG PO TABS
600.0000 mg | ORAL_TABLET | Freq: Four times a day (QID) | ORAL | Status: DC
  Administered 2014-01-21 – 2014-01-24 (×12): 600 mg via ORAL
  Filled 2014-01-21 (×12): qty 1

## 2014-01-21 MED ORDER — PHENYLEPHRINE 8 MG IN D5W 100 ML (0.08MG/ML) PREMIX OPTIME
INJECTION | INTRAVENOUS | Status: AC
  Filled 2014-01-21: qty 100

## 2014-01-21 MED ORDER — KETOROLAC TROMETHAMINE 30 MG/ML IJ SOLN
INTRAMUSCULAR | Status: AC
  Administered 2014-01-21: 30 mg via INTRAMUSCULAR
  Filled 2014-01-21: qty 1

## 2014-01-21 MED ORDER — METOCLOPRAMIDE HCL 5 MG/ML IJ SOLN: 10.0000 mg | Freq: Three times a day (TID) | INTRAMUSCULAR | Status: DC | PRN

## 2014-01-21 MED ORDER — PHENYLEPHRINE 8 MG IN D5W 100 ML (0.08MG/ML) PREMIX OPTIME
INJECTION | INTRAVENOUS | Status: DC | PRN
  Administered 2014-01-21: 60 ug/min via INTRAVENOUS

## 2014-01-21 MED ORDER — KETOROLAC TROMETHAMINE 30 MG/ML IJ SOLN: 30.0000 mg | Freq: Four times a day (QID) | INTRAMUSCULAR | Status: AC | PRN

## 2014-01-21 MED ORDER — MORPHINE SULFATE (PF) 0.5 MG/ML IJ SOLN
INTRAMUSCULAR | Status: DC | PRN
  Administered 2014-01-21: .15 mg via INTRATHECAL

## 2014-01-21 MED ORDER — PRENATAL MULTIVITAMIN CH
1.0000 | ORAL_TABLET | Freq: Every day | ORAL | Status: DC
  Administered 2014-01-22 – 2014-01-24 (×3): 1 via ORAL
  Filled 2014-01-21 (×3): qty 1

## 2014-01-21 MED ORDER — NALOXONE HCL 0.4 MG/ML IJ SOLN: 0.4000 mg | INTRAMUSCULAR | Status: DC | PRN

## 2014-01-21 MED ORDER — OXYTOCIN 40 UNITS IN LACTATED RINGERS INFUSION - SIMPLE MED: 62.5000 mL/h | INTRAVENOUS | Status: AC

## 2014-01-21 MED ORDER — CEFAZOLIN SODIUM-DEXTROSE 2-3 GM-% IV SOLR
INTRAVENOUS | Status: AC
  Filled 2014-01-21: qty 50

## 2014-01-21 MED ORDER — FENTANYL CITRATE 0.05 MG/ML IJ SOLN
INTRAMUSCULAR | Status: DC | PRN
  Administered 2014-01-21: 25 ug via INTRATHECAL

## 2014-01-21 MED ORDER — NALOXONE HCL 1 MG/ML IJ SOLN: 1.0000 ug/kg/h | INTRAVENOUS | Status: DC | PRN

## 2014-01-21 MED ORDER — LACTATED RINGERS IV SOLN
40.0000 [IU] | INTRAVENOUS | Status: DC | PRN
  Administered 2014-01-21: 40 [IU] via INTRAVENOUS

## 2014-01-21 MED ORDER — NALBUPHINE HCL 10 MG/ML IJ SOLN: 5.0000 mg | INTRAMUSCULAR | Status: DC | PRN

## 2014-01-21 MED ORDER — MORPHINE SULFATE 0.5 MG/ML IJ SOLN
INTRAMUSCULAR | Status: AC
  Filled 2014-01-21: qty 10

## 2014-01-21 MED ORDER — BUPIVACAINE IN DEXTROSE 0.75-8.25 % IT SOLN
INTRATHECAL | Status: DC | PRN
  Administered 2014-01-21: 1.4 mL via INTRATHECAL

## 2014-01-21 MED ORDER — SIMETHICONE 80 MG PO CHEW
80.0000 mg | CHEWABLE_TABLET | ORAL | Status: DC
  Administered 2014-01-22 – 2014-01-23 (×3): 80 mg via ORAL
  Filled 2014-01-21 (×3): qty 1

## 2014-01-21 MED ORDER — WITCH HAZEL-GLYCERIN EX PADS: 1.0000 "application " | MEDICATED_PAD | CUTANEOUS | Status: DC | PRN

## 2014-01-21 MED ORDER — SCOPOLAMINE 1 MG/3DAYS TD PT72
MEDICATED_PATCH | TRANSDERMAL | Status: AC
  Administered 2014-01-21: 1.5 mg via TRANSDERMAL
  Filled 2014-01-21: qty 1

## 2014-01-21 MED ORDER — ONDANSETRON HCL 4 MG/2ML IJ SOLN
INTRAMUSCULAR | Status: DC | PRN
  Administered 2014-01-21: 4 mg via INTRAVENOUS

## 2014-01-21 MED ORDER — MAGNESIUM HYDROXIDE 400 MG/5ML PO SUSP: 30.0000 mL | ORAL | Status: DC | PRN

## 2014-01-21 MED ORDER — OXYTOCIN 10 UNIT/ML IJ SOLN
INTRAMUSCULAR | Status: AC
  Filled 2014-01-21: qty 4

## 2014-01-21 MED ORDER — ONDANSETRON HCL 4 MG/2ML IJ SOLN
INTRAMUSCULAR | Status: AC
  Filled 2014-01-21: qty 2

## 2014-01-21 MED ORDER — SIMETHICONE 80 MG PO CHEW: 80.0000 mg | CHEWABLE_TABLET | ORAL | Status: DC | PRN

## 2014-01-21 MED ORDER — DIPHENHYDRAMINE HCL 25 MG PO CAPS
25.0000 mg | ORAL_CAPSULE | Freq: Four times a day (QID) | ORAL | Status: DC | PRN
  Administered 2014-01-22: 25 mg via ORAL
  Filled 2014-01-21: qty 1

## 2014-01-21 MED ORDER — 0.9 % SODIUM CHLORIDE (POUR BTL) OPTIME
TOPICAL | Status: DC | PRN
  Administered 2014-01-21: 1000 mL

## 2014-01-21 MED ORDER — ZOLPIDEM TARTRATE 5 MG PO TABS: 5.0000 mg | ORAL_TABLET | Freq: Every evening | ORAL | Status: DC | PRN

## 2014-01-21 MED ORDER — TETANUS-DIPHTH-ACELL PERTUSSIS 5-2.5-18.5 LF-MCG/0.5 IM SUSP
0.5000 mL | Freq: Once | INTRAMUSCULAR | Status: AC
  Administered 2014-01-22: 0.5 mL via INTRAMUSCULAR
  Filled 2014-01-21: qty 0.5

## 2014-01-21 MED ORDER — SENNOSIDES-DOCUSATE SODIUM 8.6-50 MG PO TABS
2.0000 | ORAL_TABLET | ORAL | Status: DC
  Administered 2014-01-22 – 2014-01-23 (×3): 2 via ORAL
  Filled 2014-01-21 (×3): qty 2

## 2014-01-21 MED ORDER — DIPHENHYDRAMINE HCL 50 MG/ML IJ SOLN
INTRAMUSCULAR | Status: AC
  Filled 2014-01-21: qty 1

## 2014-01-21 MED ORDER — FENTANYL CITRATE 0.05 MG/ML IJ SOLN
25.0000 ug | INTRAMUSCULAR | Status: DC | PRN
  Administered 2014-01-21: 50 ug via INTRAVENOUS

## 2014-01-21 MED ORDER — CEFAZOLIN SODIUM-DEXTROSE 2-3 GM-% IV SOLR
2.0000 g | INTRAVENOUS | Status: AC
  Administered 2014-01-21: 2 g via INTRAVENOUS

## 2014-01-21 MED ORDER — MEPERIDINE HCL 25 MG/ML IJ SOLN: 6.2500 mg | INTRAMUSCULAR | Status: DC | PRN

## 2014-01-21 MED ORDER — SCOPOLAMINE 1 MG/3DAYS TD PT72: 1.0000 | MEDICATED_PATCH | Freq: Once | TRANSDERMAL | Status: DC

## 2014-01-21 MED ORDER — ONDANSETRON HCL 4 MG/2ML IJ SOLN: 4.0000 mg | INTRAMUSCULAR | Status: DC | PRN

## 2014-01-21 MED ORDER — KETOROLAC TROMETHAMINE 30 MG/ML IJ SOLN
30.0000 mg | Freq: Four times a day (QID) | INTRAMUSCULAR | Status: AC | PRN
  Administered 2014-01-21: 30 mg via INTRAMUSCULAR

## 2014-01-21 MED ORDER — LANOLIN HYDROUS EX OINT: 1.0000 "application " | TOPICAL_OINTMENT | CUTANEOUS | Status: DC | PRN

## 2014-01-21 MED ORDER — FENTANYL CITRATE 0.05 MG/ML IJ SOLN
INTRAMUSCULAR | Status: AC
  Administered 2014-01-21: 50 ug via INTRAVENOUS
  Filled 2014-01-21: qty 2

## 2014-01-21 MED ORDER — FENTANYL CITRATE 0.05 MG/ML IJ SOLN
INTRAMUSCULAR | Status: AC
  Filled 2014-01-21: qty 2

## 2014-01-21 MED ORDER — DIPHENHYDRAMINE HCL 50 MG/ML IJ SOLN: 12.5000 mg | INTRAMUSCULAR | Status: DC | PRN

## 2014-01-21 MED ORDER — DIPHENHYDRAMINE HCL 50 MG/ML IJ SOLN
INTRAMUSCULAR | Status: DC | PRN
  Administered 2014-01-21: 25 mg via INTRAVENOUS

## 2014-01-21 MED ORDER — LACTATED RINGERS IV SOLN
INTRAVENOUS | Status: DC
Start: 2014-01-21 — End: 2014-01-21
  Administered 2014-01-21 (×3): via INTRAVENOUS

## 2014-01-21 MED ORDER — DIPHENHYDRAMINE HCL 25 MG PO CAPS: 25.0000 mg | ORAL_CAPSULE | ORAL | Status: DC | PRN

## 2014-01-21 MED ORDER — BUPIVACAINE HCL (PF) 0.25 % IJ SOLN
INTRAMUSCULAR | Status: AC
  Filled 2014-01-21: qty 30

## 2014-01-21 MED ORDER — LACTATED RINGERS IV SOLN
INTRAVENOUS | Status: DC
  Administered 2014-01-21: 21:00:00 via INTRAVENOUS

## 2014-01-21 MED ORDER — FERROUS SULFATE 325 (65 FE) MG PO TABS
325.0000 mg | ORAL_TABLET | Freq: Two times a day (BID) | ORAL | Status: DC
  Administered 2014-01-22 – 2014-01-24 (×5): 325 mg via ORAL
  Filled 2014-01-21 (×5): qty 1

## 2014-01-21 MED ORDER — ONDANSETRON HCL 4 MG PO TABS: 4.0000 mg | ORAL_TABLET | ORAL | Status: DC | PRN

## 2014-01-21 MED ORDER — SODIUM CHLORIDE 0.9 % IJ SOLN: 3.0000 mL | INTRAMUSCULAR | Status: DC | PRN

## 2014-01-21 MED ORDER — OXYCODONE-ACETAMINOPHEN 5-325 MG PO TABS
1.0000 | ORAL_TABLET | ORAL | Status: DC | PRN
  Administered 2014-01-21: 1 via ORAL
  Administered 2014-01-22 (×3): 2 via ORAL
  Administered 2014-01-22: 1 via ORAL
  Administered 2014-01-22 – 2014-01-23 (×3): 2 via ORAL
  Administered 2014-01-23: 1 via ORAL
  Administered 2014-01-23 (×2): 2 via ORAL
  Administered 2014-01-23: 1 via ORAL
  Administered 2014-01-24 (×3): 2 via ORAL
  Filled 2014-01-21: qty 2
  Filled 2014-01-21: qty 1
  Filled 2014-01-21: qty 2
  Filled 2014-01-21: qty 1
  Filled 2014-01-21 (×3): qty 2
  Filled 2014-01-21: qty 1
  Filled 2014-01-21: qty 2
  Filled 2014-01-21: qty 1
  Filled 2014-01-21 (×5): qty 2

## 2014-01-21 MED ORDER — SCOPOLAMINE 1 MG/3DAYS TD PT72
1.0000 | MEDICATED_PATCH | Freq: Once | TRANSDERMAL | Status: DC
  Administered 2014-01-21: 1.5 mg via TRANSDERMAL

## 2014-01-21 MED ORDER — PHENYLEPHRINE 40 MCG/ML (10ML) SYRINGE FOR IV PUSH (FOR BLOOD PRESSURE SUPPORT)
PREFILLED_SYRINGE | INTRAVENOUS | Status: AC
  Filled 2014-01-21: qty 5

## 2014-01-21 MED ORDER — ONDANSETRON HCL 4 MG/2ML IJ SOLN: 4.0000 mg | Freq: Three times a day (TID) | INTRAMUSCULAR | Status: DC | PRN

## 2014-01-21 SURGICAL SUPPLY — 40 items
CANISTER WOUND CARE 500ML ATS (WOUND CARE) IMPLANT
CLAMP CORD UMBIL (MISCELLANEOUS) IMPLANT
CLOTH BEACON ORANGE TIMEOUT ST (SAFETY) ×2 IMPLANT
CONTAINER PREFILL 10% NBF 15ML (MISCELLANEOUS) IMPLANT
DERMABOND ADVANCED (GAUZE/BANDAGES/DRESSINGS)
DERMABOND ADVANCED .7 DNX12 (GAUZE/BANDAGES/DRESSINGS) IMPLANT
DRAPE LG THREE QUARTER DISP (DRAPES) ×2 IMPLANT
DRSG OPSITE POSTOP 4X10 (GAUZE/BANDAGES/DRESSINGS) ×2 IMPLANT
DRSG VAC ATS LRG SENSATRAC (GAUZE/BANDAGES/DRESSINGS) IMPLANT
DRSG VAC ATS MED SENSATRAC (GAUZE/BANDAGES/DRESSINGS) IMPLANT
DRSG VAC ATS SM SENSATRAC (GAUZE/BANDAGES/DRESSINGS) IMPLANT
DURAPREP 26ML APPLICATOR (WOUND CARE) ×2 IMPLANT
ELECT REM PT RETURN 9FT ADLT (ELECTROSURGICAL) ×2
ELECTRODE REM PT RTRN 9FT ADLT (ELECTROSURGICAL) ×1 IMPLANT
EXTRACTOR VACUUM M CUP 4 TUBE (SUCTIONS) ×2 IMPLANT
GLOVE BIO SURGEON STRL SZ8 (GLOVE) ×4 IMPLANT
GOWN STRL REUS W/TWL LRG LVL3 (GOWN DISPOSABLE) ×6 IMPLANT
KIT ABG SYR 3ML LUER SLIP (SYRINGE) IMPLANT
NEEDLE HYPO 25X5/8 SAFETYGLIDE (NEEDLE) IMPLANT
NS IRRIG 1000ML POUR BTL (IV SOLUTION) ×2 IMPLANT
PACK C SECTION WH (CUSTOM PROCEDURE TRAY) ×2 IMPLANT
PAD OB MATERNITY 4.3X12.25 (PERSONAL CARE ITEMS) ×2 IMPLANT
RTRCTR C-SECT PINK 25CM LRG (MISCELLANEOUS) ×2 IMPLANT
STAPLER VISISTAT 35W (STAPLE) IMPLANT
SUT GUT PLAIN 0 CT-3 TAN 27 (SUTURE) IMPLANT
SUT MNCRL 0 VIOLET CTX 36 (SUTURE) ×4 IMPLANT
SUT MNCRL AB 4-0 PS2 18 (SUTURE) IMPLANT
SUT MON AB 2-0 CT1 27 (SUTURE) ×2 IMPLANT
SUT MON AB 3-0 SH 27 (SUTURE)
SUT MON AB 3-0 SH27 (SUTURE) IMPLANT
SUT MONOCRYL 0 CTX 36 (SUTURE) ×4
SUT PDS AB 0 CTX 60 (SUTURE) IMPLANT
SUT PLAIN 2 0 XLH (SUTURE) IMPLANT
SUT VIC AB 0 CTX 36 (SUTURE)
SUT VIC AB 0 CTX36XBRD ANBCTRL (SUTURE) IMPLANT
SUT VIC AB 2-0 CT1 27 (SUTURE)
SUT VIC AB 2-0 CT1 TAPERPNT 27 (SUTURE) IMPLANT
TOWEL OR 17X24 6PK STRL BLUE (TOWEL DISPOSABLE) ×6 IMPLANT
TRAY FOLEY CATH 14FR (SET/KITS/TRAYS/PACK) ×2 IMPLANT
WATER STERILE IRR 1000ML POUR (IV SOLUTION) IMPLANT

## 2014-01-21 NOTE — Transfer of Care (Signed)
Immediate Anesthesia Transfer of Care Note  Patient: Julia Fowler  Procedure(s) Performed: Procedure(s): REPEAT CESAREAN SECTION (N/A)  Patient Location: PACU  Anesthesia Type:Spinal  Level of Consciousness: awake, alert  and oriented  Airway & Oxygen Therapy: Patient Spontanous Breathing  Post-op Assessment: Report given to PACU RN and Post -op Vital signs reviewed and stable  Post vital signs: Reviewed and stable  Complications: No apparent anesthesia complications

## 2014-01-21 NOTE — Anesthesia Procedure Notes (Signed)
Spinal  Patient location during procedure: OR Start time: 01/21/2014 9:30 AM Staffing Anesthesiologist: Jaslynn Thome A. Performed by: anesthesiologist  Preanesthetic Checklist Completed: patient identified, site marked, surgical consent, pre-op evaluation, timeout performed, IV checked, risks and benefits discussed and monitors and equipment checked Spinal Block Patient position: sitting Prep: site prepped and draped and DuraPrep Patient monitoring: heart rate, cardiac monitor, continuous pulse ox and blood pressure Approach: midline Location: L4-5 Injection technique: single-shot Needle Needle type: Sprotte  Needle gauge: 24 G Needle length: 9 cm Assessment Sensory level: T4 Additional Notes Patient tolerated procedure well. Adequate sensory level.

## 2014-01-21 NOTE — H&P (Signed)
Julia Fowler is a 28 y.o. female presenting for repeat cesarean section. Maternal Medical History:  Fetal activity: Perceived fetal activity is normal.   Last perceived fetal movement was within the past hour.    Prenatal complications: no prenatal complications Prenatal Complications - Diabetes: none.    OB History   Grav Para Term Preterm Abortions TAB SAB Ect Mult Living   4 1 1  1 1    1      Past Medical History  Diagnosis Date  . Rickets   . Thyroid disease hypothyroid  . Seasonal allergies   . Hypothyroidism     no medication   Past Surgical History  Procedure Laterality Date  . Cesarean section    . Tooth extraction Left    Family History: family history includes Cancer in her sister; Diabetes in her maternal grandmother. Social History:  reports that she has never smoked. She has never used smokeless tobacco. She reports that she drinks alcohol. She reports that she does not use illicit drugs.   Prenatal Transfer Tool  Maternal Diabetes: No Genetic Screening: Normal Maternal Ultrasounds/Referrals: Normal Fetal Ultrasounds or other Referrals:  None Maternal Substance Abuse:  No Significant Maternal Medications:  None Significant Maternal Lab Results:  Lab values include: Group B Strep negative Other Comments:  None  Review of Systems  All other systems reviewed and are negative.     Blood pressure 125/72, pulse 89, temperature 98.4 F (36.9 C), temperature source Oral, resp. rate 18, last menstrual period 04/22/2013, SpO2 98.00%, unknown if currently breastfeeding. Maternal Exam:  Abdomen: Patient reports no abdominal tenderness.   Physical Exam  Nursing note and vitals reviewed. Constitutional: She is oriented to person, place, and time. She appears well-developed and well-nourished.  HENT:  Head: Normocephalic and atraumatic.  Eyes: Conjunctivae are normal. Pupils are equal, round, and reactive to light.  Neck: Normal range of motion. Neck supple.   Cardiovascular: Normal rate and regular rhythm.   Respiratory: Effort normal.  GI: Soft.  Genitourinary: Vagina normal and uterus normal.  Musculoskeletal: Normal range of motion.  Neurological: She is alert and oriented to person, place, and time.  Skin: Skin is warm and dry.  Psychiatric: She has a normal mood and affect. Her behavior is normal. Judgment and thought content normal.    Prenatal labs: ABO, Rh: --/--/A POS, A POS (07/15 1110) Antibody: NEG (07/15 1110) Rubella: 1.53 (01/06 1312) RPR: NON REAC (07/15 1110)  HBsAg: NEGATIVE (01/06 1312)  HIV: NONREACTIVE (04/09 1633)  GBS: NOT DETECTED (06/19 1307)   Assessment/Plan: 39 weeks.  Previous cesarean section.  Desires repeat cesarean section.   Eddis Pingleton A 01/21/2014, 8:53 AM

## 2014-01-21 NOTE — Anesthesia Postprocedure Evaluation (Signed)
  Anesthesia Post-op Note  Patient: Julia Fowler  Procedure(s) Performed: Procedure(s): REPEAT CESAREAN SECTION (N/A)  Patient Location: PACU  Anesthesia Type:Spinal  Level of Consciousness: awake, alert  and oriented  Airway and Oxygen Therapy: Patient Spontanous Breathing  Post-op Pain: none  Post-op Assessment: Post-op Vital signs reviewed, Patient's Cardiovascular Status Stable, Respiratory Function Stable, Patent Airway, No signs of Nausea or vomiting, Pain level controlled, No headache and No backache  Post-op Vital Signs: Reviewed and stable  Last Vitals:  Filed Vitals:   01/21/14 1130  BP: 113/67  Pulse: 65  Temp:   Resp: 18    Complications: No apparent anesthesia complications

## 2014-01-21 NOTE — Anesthesia Preprocedure Evaluation (Addendum)

## 2014-01-21 NOTE — Addendum Note (Signed)
Addendum created 01/21/14 1608 by Shanon PayorSuzanne M Isais Klipfel, CRNA   Modules edited: Notes Section   Notes Section:  File: [id number]

## 2014-01-21 NOTE — Op Note (Signed)
Cesarean Section Procedure Note   [place] BingLisa R Cock[place]   01/21/2014  Indications: Scheduled Proceedure/Maternal Request   Pre-operative Diagnosis: PREV CS.   Post-operative Diagnosis: Same   Surgeon: Coral CeoHARPER,Smantha Boakye A  Assistants:  Antionette CharJackson-Moore, Fancy  Anesthesia: spinal  Procedure Details:  The patient was seen in the Holding Room. The risks, benefits, complications, treatment options, and expected outcomes were discussed with the patient. The patient concurred with the proposed plan, giving informed consent. The patient was identified as [place] BingLisa R [place] and the procedure verified as C-Section Delivery. A Time Out was held and the above information confirmed.  After induction of anesthesia, the patient was draped and prepped in the usual sterile manner. A transverse incision was made and carried down through the subcutaneous tissue to the fascia. The fascial incision was made and extended transversely. The fascia was separated from the underlying rectus tissue superiorly and inferiorly. The peritoneum was identified and entered. The peritoneal incision was extended longitudinally.  Multiple serosal adhesions were then carefully excised. The utero-vesical peritoneal reflection was incised transversely and the bladder flap was bluntly freed from the lower uterine segment. A low transverse uterine incision was made. Delivered from cephalic presentation was a 3405 gram living newborn female infant(s). APGAR (1 MIN): 8   APGAR (5 MINS): 9   APGAR (10 MINS):    A cord ph was not sent. The umbilical cord was clamped and cut cord. A sample was obtained for evaluation. The placenta was removed Intact and appeared normal.  The uterine incision was closed with running locked sutures of 1-0 Monocryl. A second imbricating layer of the same suture was placed.  Hemostasis was observed. The paracolic gutters were irrigated. The parieto peritoneum was closed in a running fashion with 2-0 Vicryl.  The fascia was then  reapproximated with running sutures of 0 PDS.  The skin was closed with staples.  Instrument, sponge, and needle counts were correct prior the abdominal closure and were correct at the conclusion of the case.    Findings:  Multiple serosal adhesions to lateral and anterior peritoneum, and vescicouterine reflection.  Normal uterus, ovaries and tubes.   Estimated Blood Loss: Total IV Fluids: 2400ml   Urine Output: 200CC OF clear urine  Specimens:  Placenta to L&D  Complications: no complications    Disposition: PACU - hemodynamically stable.  Maternal Condition: stable   Baby condition / location:  Couplet care / Skin to Skin    Signed: Surgeon(s): Antionette CharLisa Jackson-Moore, MD Brock Badharles A Alexus Galka, MD

## 2014-01-21 NOTE — Anesthesia Postprocedure Evaluation (Signed)
  Anesthesia Post-op Note  Patient: Julia Fowler  Procedure(s) Performed: Procedure(s): REPEAT CESAREAN SECTION (N/A)  Patient Location: Mother/Baby  Anesthesia Type:Spinal  Level of Consciousness: awake, alert  and oriented  Airway and Oxygen Therapy: Patient Spontanous Breathing  Post-op Pain: none  Post-op Assessment: Post-op Vital signs reviewed, Patient's Cardiovascular Status Stable, Respiratory Function Stable, No headache, No backache, No residual numbness and No residual motor weakness  Post-op Vital Signs: Reviewed and stable  Last Vitals:  Filed Vitals:   01/21/14 1430  BP: 119/68  Pulse: 77  Temp: 36.8 C  Resp: 20    Complications: No apparent anesthesia complications

## 2014-01-22 ENCOUNTER — Encounter: Payer: Medicaid Other | Admitting: Advanced Practice Midwife

## 2014-01-22 ENCOUNTER — Encounter (HOSPITAL_COMMUNITY): Payer: Self-pay | Admitting: *Deleted

## 2014-01-22 LAB — CBC
HEMATOCRIT: 31.1 % — AB (ref 36.0–46.0)
Hemoglobin: 10.5 g/dL — ABNORMAL LOW (ref 12.0–15.0)
MCH: 31.8 pg (ref 26.0–34.0)
MCHC: 33.8 g/dL (ref 30.0–36.0)
MCV: 94.2 fL (ref 78.0–100.0)
Platelets: 173 10*3/uL (ref 150–400)
RBC: 3.3 MIL/uL — AB (ref 3.87–5.11)
RDW: 13.2 % (ref 11.5–15.5)
WBC: 6.8 10*3/uL (ref 4.0–10.5)

## 2014-01-22 LAB — BIRTH TISSUE RECOVERY COLLECTION (PLACENTA DONATION)

## 2014-01-22 NOTE — Lactation Note (Signed)
This note was copied from the chart of Julia Calvert CantorLisa Laban. Lactation Consultation Note    Follow up consult with this mom and baby, now 30 hours old, and full term. Cluster feeding, and I assisted mom with latching baby, skin to skin, in cross cradle hold. Baby latched well, and mom reports latch "does not hurt" Mom had been using cradle hold, and has sore nipples, with cracks at the base, no bleeding. I advised mom to apply EBM and gave her comfort gels, and instructed her in their use. Mom knows to call for questions/concerns.Baby and Me book pages reviewed with mom.  Patient Name: Julia Fowler ZOXWR'UToday's Date: 01/22/2014 Reason for consult: Follow-up assessment   Maternal Data Formula Feeding for Exclusion: No Infant to breast within first hour of birth: Yes Has patient been taught Hand Expression?: Yes Does the patient have breastfeeding experience prior to this delivery?: No  Feeding Feeding Type: Breast Fed (started feeding at [id number])  [facility] Score/Interventions Latch: Grasps breast easily, tongue down, lips flanged, rhythmical sucking.  Audible Swallowing: A few with stimulation Intervention(s): Skin to skin;Hand expression  Type of Nipple: Everted at rest and after stimulation  Comfort (Breast/Nipple): Filling, red/small blisters or bruises, mild/mod discomfort  Problem noted: Cracked, bleeding, blisters, bruises;Mild/Moderate discomfort Interventions  (Cracked/bleeding/bruising/blister): Expressed breast milk to nipple Interventions (Mild/moderate discomfort): Comfort gels  Hold (Positioning): Assistance needed to correctly position infant at breast and maintain latch. Intervention(s): Support Pillows;Position options;Skin to skin  LATCH Score: 7  Lactation Tools Discussed/Used     Consult Status Consult Status: Follow-up Date: 01/23/14 Follow-up type: In-patient    Alfred LevinsLee, Nechama Escutia Anne 01/22/2014, 4:32 PM

## 2014-01-22 NOTE — Progress Notes (Signed)
Subjective: Postpartum Day 1: Cesarean Delivery Patient reports tolerating PO.    Objective: Vital signs in last 24 hours: Temp:  [97.3 F (36.3 C)-99.3 F (37.4 C)] 98.9 F (37.2 C) (07/17 0930) Pulse Rate:  [64-104] 73 (07/17 0930) Resp:  [14-20] 18 (07/17 0930) BP: (95-127)/(46-77) 126/69 mmHg (07/17 0930) SpO2:  [95 %-100 %] 98 % (07/17 0514) Weight:  [195 lb (88.451 kg)] 195 lb (88.451 kg) (07/16 1300)  Physical Exam:  General: alert and no distress Lochia: appropriate Uterine Fundus: firm Incision: healing well DVT Evaluation: No evidence of DVT seen on physical exam.   Recent Labs  01/20/14 1110 01/22/14 0540  HGB 12.1 10.5*  HCT 35.5* 31.1*    Assessment/Plan: Status post Cesarean section. Doing well postoperatively.  Continue current care.  HARPER,CHARLES A 01/22/2014, 10:41 AM

## 2014-01-22 NOTE — Progress Notes (Signed)
UR completed 

## 2014-01-23 NOTE — Lactation Note (Signed)
This note was copied from the chart of Julia Calvert CantorLisa Gosser. Lactation Consultation Note  Mother's nipples are both cracked and sore. Assisted mother in increasing depth of feeding by compressing breast. Mother states improved comfort. Encouraged mother to apply ebm and air dry then apply comfort gels. Sucks and swallows observed, lips flanged. Encouraged mother to [facility] when baby slips down on nipple and call for assistance with depth. Patient Name: Julia Calvert CantorLisa Naeem [id number]'s Date: 01/23/2014 Reason for consult: Follow-up assessment   Maternal Data    Feeding Feeding Type: Breast Fed Length of feed: 20 min  LATCH Score/Interventions Latch: Grasps breast easily, tongue down, lips flanged, rhythmical sucking.  Audible Swallowing: Spontaneous and intermittent Intervention(s): Hand expression  Type of Nipple: Everted at rest and after stimulation  Comfort (Breast/Nipple): Engorged, cracked, bleeding, large blisters, severe discomfort Problem noted: Cracked, bleeding, blisters, bruises  Problem noted: Cracked, bleeding, blisters, bruises Interventions (Mild/moderate discomfort): Comfort gels;Hand expression  Hold (Positioning): Assistance needed to correctly position infant at breast and maintain latch. Intervention(s): Support Pillows  LATCH Score: 7  Lactation Tools Discussed/Used Tools: Comfort gels   Consult Status Consult Status: Follow-up Date: 01/24/14    Dahlia ByesBerkelhammer, Erikson Danzy [facility] 01/23/2014, 11:44 AM

## 2014-01-24 MED ORDER — IBUPROFEN 600 MG PO TABS: 600.0000 mg | ORAL_TABLET | Freq: Four times a day (QID) | ORAL | Status: DC | PRN

## 2014-01-24 MED ORDER — OXYCODONE-ACETAMINOPHEN 5-325 MG PO TABS: 1.0000 | ORAL_TABLET | ORAL | Status: DC | PRN

## 2014-01-24 NOTE — Progress Notes (Signed)
Discharge instructions and papers explained to patient. Denies questions at this time.

## 2014-01-24 NOTE — Lactation Note (Signed)
This note was copied from the chart of Julia Calvert CantorLisa [place]ille. Lactation Consultation Note  Mother nipples are very cracked, deep crack on right.  Mother is now pumping and formula feeding due to discomfort. Mother has comfort gels and knows about applying ebm. Dannielle Burnontacted Harper MD and suggested [facility]  [facility] parents [facility]. Mother recently pumped 50 ml of breast milk.  Volume guidelines provided.  Baby had distinct labial frenulum possibly causing nipple soreness. Encouraged mother to call for further assistance.   Patient Name: Julia Fowler ZOXWR'UToday's Date: 01/24/2014 Reason for consult: Follow-up assessment   Maternal Data    Feeding Feeding Type: Bottle Fed - Breast Milk  LATCH Score/Interventions                      Lactation Tools Discussed/Used     Consult Status      Hardie PulleyBerkelhammer, Thea Holshouser Boschen 01/24/2014, 11:09 AM

## 2014-01-24 NOTE — Discharge Instructions (Signed)
Before Baby Comes Home °Ask any questions about feeding, diapering, and baby care before you leave the hospital. Ask again if you do not understand. Ask when you need to see the doctor again. °There are several things you must have before your baby comes home. °· Infant car seat. °· Crib. °¨ Do not let your baby sleep in a bed with you or anyone else. °¨ If you do not have a bed for your baby, ask the doctor what you can use that will be safe for the baby to sleep in. °Infant feeding supplies: °· 6 to 8 bottles (8 oz. size). °· 6 to 8 nipples. °· Measuring cup. °· Measuring tablespoon. °· Bottle brush. °· Sterilizer (or use any large pan or kettle with a lid). °· Formula that contains iron. °· A way to boil and cool water. °Breastfeeding supplies: °· Breast pump. °· Nipple cream. °Clothing: °· 24 to 36 cloth diapers and waterproof diaper covers or a box of disposable diapers. You may need as many as 10 to 12 diapers per day. °· 3 onesies (other clothing will depend on the time of year and the weather). °· 3 receiving blankets. °· 3 baby pajamas or gowns. °· 3 bibs. °Bath equipment: °· Mild soap. °· Petroleum jelly. No baby oil or powder. °· Soft cloth towel and wash cloth. °· Cotton balls. °· Separate bath basin for baby. Only sponge bathe until umbilical cord and circumcision are healed. °Other supplies: °· Thermometer and bulb syringe (ask the hospital to send them home with you). Ask your doctor about how you should take your baby's temperature. °· One to two pacifiers. °Prepare for an emergency: °· Know how to get to the hospital and know where to admit your baby. °· Put all doctor numbers near your house phone and in your cell phone if you have one. °Prepare your family: °· Talk with siblings about the baby coming home and how they feel about it. °· Decide how you want to handle visitors and other family members. °· Take offers for help with the baby. You will need time to adjust. °Know when to call the doctor.   °GET HELP RIGHT AWAY IF: °· Your baby's temperature is greater than 100.4° F (38° C). °· The softspot on your baby's head starts to bulge. °· Your baby is crying with no tears or has no wet diapers for 6 hours. °· Your baby has rapid breathing. °· Your baby is not as alert. °Document Released: 06/07/2008 Document Revised: 09/17/2011 Document Reviewed: 09/14/2010 °ExitCare® Patient Information ©2015 [employer]. This information is not intended to replace advice given to you by your health care provider. Make sure you discuss any questions you have with your health care provider. ° °

## 2014-01-24 NOTE — Discharge Summary (Signed)
Obstetric Discharge Summary Reason for Admission: cesarean section Prenatal Procedures: ultrasound Intrapartum Procedures: cesarean: low cervical, transverse Postpartum Procedures: none Complications-Operative and Postpartum: none Hemoglobin  Date Value Ref Range Status  01/22/2014 10.5* 12.0 - 15.0 g/dL Final     HCT  Date Value Ref Range Status  01/22/2014 31.1* 36.0 - 46.0 % Final    Physical Exam:  General: alert and no distress Lochia: appropriate Uterine Fundus: firm Incision: healing well DVT Evaluation: No evidence of DVT seen on physical exam.  Discharge Diagnoses: Term Pregnancy-delivered  Discharge Information: Date: 01/24/2014 Activity: pelvic rest Diet: routine Medications: PNV, Ibuprofen, Colace and Percocet Condition: stable Instructions: refer to practice specific booklet Discharge to: home Follow-up Information   Follow up with Yahmir Sokolov A, MD. Schedule an appointment as soon as possible for a visit on 01/27/2014. (Staple removal)    Specialty:  Obstetrics and Gynecology   Contact information:   [address] [place] [address] [phone]       Newborn Data: Live born female  Birth Weight: 7 lb 8.1 oz (3405 g) APGAR: 8, 9  Home with mother.  Emanuelle Hammerstrom A 01/24/2014, 7:29 AM

## 2014-01-24 NOTE — Progress Notes (Signed)
Spoke with Dr Clearance CootsHarper, stated do not remove staples and leave honeycomb dressing to go home.

## 2014-01-24 NOTE — Progress Notes (Signed)
Subjective: Postpartum Day 3: Cesarean Delivery Patient reports tolerating PO, + flatus, + BM and no problems voiding.    Objective: Vital signs in last 24 hours: Temp:  [98.5 F (36.9 C)] 98.5 F (36.9 C) (07/19 16100633) Pulse Rate:  [75-77] 77 (07/19 0633) Resp:  [19] 19 (07/19 0633) BP: (122-127)/(63-72) 127/72 mmHg (07/19 0633) SpO2:  [100 %] 100 % (07/19 [id number])  Physical Exam:  General: alert and no distress Lochia: appropriate Uterine Fundus: firm Incision: healing well DVT Evaluation: No evidence of DVT seen on physical exam.   Recent Labs  01/22/14 0540  HGB 10.5*  HCT 31.1*    Assessment/Plan: Status post Cesarean section. Doing well postoperatively.  Discharge home with standard precautions and return to clinic in 2 weeks.  Roger Fasnacht A 01/24/2014, 7:25 AM

## 2014-01-26 ENCOUNTER — Inpatient Hospital Stay (HOSPITAL_COMMUNITY)
Admission: AD | Admit: 2014-01-26 | Discharge: 2014-01-26 | Disposition: A | Discharge status: Home / Self Care | Payer: Medicaid Other | Source: Ambulatory Visit | Attending: Obstetrics | Admitting: Obstetrics

## 2014-01-26 ENCOUNTER — Other Ambulatory Visit: Payer: Medicaid Other

## 2014-01-26 ENCOUNTER — Encounter (HOSPITAL_COMMUNITY): Payer: Self-pay | Admitting: *Deleted

## 2014-01-26 ENCOUNTER — Encounter: Payer: Medicaid Other | Admitting: Advanced Practice Midwife

## 2014-01-26 DIAGNOSIS — O909 Complication of the puerperium, unspecified: Secondary | ICD-10-CM | POA: Insufficient documentation

## 2014-01-26 DIAGNOSIS — E039 Hypothyroidism, unspecified: Secondary | ICD-10-CM | POA: Diagnosis not present

## 2014-01-26 DIAGNOSIS — Z9889 Other specified postprocedural states: Secondary | ICD-10-CM

## 2014-01-26 DIAGNOSIS — O99285 Endocrine, nutritional and metabolic diseases complicating the puerperium: Secondary | ICD-10-CM

## 2014-01-26 DIAGNOSIS — E079 Disorder of thyroid, unspecified: Secondary | ICD-10-CM | POA: Insufficient documentation

## 2014-01-26 DIAGNOSIS — Z98891 History of uterine scar from previous surgery: Secondary | ICD-10-CM

## 2014-01-26 LAB — CBC WITH DIFFERENTIAL/PLATELET
BASOS ABS: 0 10*3/uL (ref 0.0–0.1)
Basophils Relative: 0 % (ref 0–1)
EOS ABS: 0.1 10*3/uL (ref 0.0–0.7)
Eosinophils Relative: 2 % (ref 0–5)
HEMATOCRIT: 30 % — AB (ref 36.0–46.0)
Hemoglobin: 10.3 g/dL — ABNORMAL LOW (ref 12.0–15.0)
Lymphocytes Relative: 8 % — ABNORMAL LOW (ref 12–46)
Lymphs Abs: 0.7 10*3/uL (ref 0.7–4.0)
MCH: 32.1 pg (ref 26.0–34.0)
MCHC: 34.3 g/dL (ref 30.0–36.0)
MCV: 93.5 fL (ref 78.0–100.0)
MONO ABS: 0.4 10*3/uL (ref 0.1–1.0)
MONOS PCT: 5 % (ref 3–12)
NEUTROS PCT: 85 % — AB (ref 43–77)
Neutro Abs: 7.3 10*3/uL (ref 1.7–7.7)
Platelets: 247 10*3/uL (ref 150–400)
RBC: 3.21 MIL/uL — ABNORMAL LOW (ref 3.87–5.11)
RDW: 12.6 % (ref 11.5–15.5)
WBC: 8.6 10*3/uL (ref 4.0–10.5)

## 2014-01-26 LAB — COMPREHENSIVE METABOLIC PANEL
ALT: 23 U/L (ref 0–35)
ANION GAP: 13 (ref 5–15)
AST: 30 U/L (ref 0–37)
Albumin: 2.6 g/dL — ABNORMAL LOW (ref 3.5–5.2)
Alkaline Phosphatase: 85 U/L (ref 39–117)
BUN: 16 mg/dL (ref 6–23)
CALCIUM: 8.3 mg/dL — AB (ref 8.4–10.5)
CO2: 22 mEq/L (ref 19–32)
Chloride: 99 mEq/L (ref 96–112)
Creatinine, Ser: 0.56 mg/dL (ref 0.50–1.10)
GFR calc Af Amer: >90 mL/min (ref >90)
GFR calc non Af Amer: >90 mL/min (ref >90)
Glucose, Bld: 89 mg/dL (ref 70–99)
Potassium: 3.7 mEq/L (ref 3.7–5.3)
Sodium: 134 mEq/L — ABNORMAL LOW (ref 137–147)
TOTAL PROTEIN: 6.2 g/dL (ref 6.0–8.3)
Total Bilirubin: 0.3 mg/dL (ref 0.3–1.2)

## 2014-01-26 LAB — URINALYSIS, ROUTINE W REFLEX MICROSCOPIC
Bilirubin Urine: NEGATIVE
Glucose, UA: NEGATIVE mg/dL
KETONES UR: NEGATIVE mg/dL
LEUKOCYTES UA: NEGATIVE
NITRITE: NEGATIVE
Protein, ur: NEGATIVE mg/dL
Specific Gravity, Urine: 1.01 (ref 1.005–1.030)
Urobilinogen, UA: 1 mg/dL (ref 0.0–1.0)
pH: 6.5 (ref 5.0–8.0)

## 2014-01-26 LAB — URINE MICROSCOPIC-ADD ON

## 2014-01-26 MED ORDER — OXYCODONE-ACETAMINOPHEN 5-325 MG PO TABS
2.0000 | ORAL_TABLET | Freq: Once | ORAL | Status: AC
Start: 2014-01-26 — End: 2014-01-26
  Administered 2014-01-26: 2 via ORAL
  Filled 2014-01-26: qty 2

## 2014-01-26 NOTE — MAU Provider Note (Signed)
History     CSN: [id number]  Arrival date and time: 01/26/14 2050   First Provider Initiated Contact with Patient 01/26/14 2219      Chief Complaint  Patient presents with  . Fever   Fever    Julia Fowler is a 28 y.o. [id number] who is S/P c-section on 01/21/14. She states that she has had increased pain, and today around 1930 she had a temp of 102.3 at home. She states that she had taken 2 percocet at 1630. She states that her bleeding has been light. She states that she is breastfeeding. She is pumping, and giving bottles. She states that she last pumped around 1530. She states that she is also having breast pain.   Past Medical History  Diagnosis Date  . Rickets   . Thyroid disease hypothyroid  . Seasonal allergies   . Hypothyroidism     no medication    Past Surgical History  Procedure Laterality Date  . Cesarean section    . Tooth extraction Left   . Cesarean section N/A 01/21/2014    Procedure: REPEAT CESAREAN SECTION;  Surgeon: Brock Bad, MD;  Location: [facility];  Service: Obstetrics;  Laterality: N/A;    Family History  Problem Relation Age of Onset  . Cancer Sister     breast  . Diabetes Maternal Grandmother     History  Substance Use Topics  . Smoking status: Never Smoker   . Smokeless tobacco: Never Used  . Alcohol Use: No     Comment: None since being pregnant    Allergies:  Allergies  Allergen Reactions  . Other     Cats and dogs     Prescriptions prior to admission  Medication Sig Dispense Refill  . ibuprofen (ADVIL,MOTRIN) 600 MG tablet Take 1 tablet (600 mg total) by mouth every 6 (six) hours as needed.  30 tablet  5  . mupirocin ointment (BACTROBAN) 2 % Apply 1 application topically 3 (three) times daily. To breasts      . oxyCODONE-acetaminophen (PERCOCET/ROXICET) 5-325 MG per tablet Take 1-2 tablets by mouth every 4 (four) hours as needed for severe pain (moderate - severe pain).  40 tablet  0  . Prenat-FeCbn-FeAspGl-FA-Omega (OB  COMPLETE PETITE) 35-5-1-200 MG CAPS Take 1 tablet by mouth daily.  90 capsule  4    Review of Systems  Constitutional: Positive for fever.   Physical Exam   Blood pressure 138/76, pulse 98, temperature 100.2 F (37.9 C), temperature source Oral, resp. rate 22, height 4\' 11"  (1.499 m), weight 87.998 kg (194 lb), last menstrual period 04/22/2013, SpO2 100.00%, unknown if currently breastfeeding.  Physical Exam  Nursing note and vitals reviewed. Constitutional: She is oriented to person, place, and time. She appears well-developed and well-nourished. No distress.  Cardiovascular: Normal rate.   Respiratory: Effort normal.  Breasts soft and non-tender, no erythema   GI: Soft. There is no tenderness (at the fundus). There is no rebound.  Fundus is nontender Incision is C/D/I   Neurological: She is alert and oriented to person, place, and time.  Skin: Skin is warm and dry.  Psychiatric: She has a normal mood and affect.    MAU Course  Procedures Results for orders placed during the hospital encounter of 01/26/14 (from the past 24 hour(s))  URINALYSIS, ROUTINE W REFLEX MICROSCOPIC     Status: Abnormal   Collection Time    01/26/14  9:13 PM      Result Value Ref Range  Color, Urine YELLOW  YELLOW   APPearance CLEAR  CLEAR   Specific Gravity, Urine 1.010  1.005 - 1.030   pH 6.5  5.0 - 8.0   Glucose, UA NEGATIVE  NEGATIVE mg/dL   Hgb urine dipstick LARGE (*) NEGATIVE   Bilirubin Urine NEGATIVE  NEGATIVE   Ketones, ur NEGATIVE  NEGATIVE mg/dL   Protein, ur NEGATIVE  NEGATIVE mg/dL   Urobilinogen, UA 1.0  0.0 - 1.0 mg/dL   Nitrite NEGATIVE  NEGATIVE   Leukocytes, UA NEGATIVE  NEGATIVE  URINE MICROSCOPIC-ADD ON     Status: Abnormal   Collection Time    01/26/14  9:13 PM      Result Value Ref Range   Squamous Epithelial / LPF FEW (*) RARE   WBC, UA 0-2  <3 WBC/hpf   RBC / HPF 7-10  <3 RBC/hpf   Bacteria, UA FEW (*) RARE   Urine-Other MUCOUS PRESENT    CBC WITH DIFFERENTIAL      Status: Abnormal   Collection Time    01/26/14  9:42 PM      Result Value Ref Range   WBC 8.6  4.0 - 10.5 K/uL   RBC 3.21 (*) 3.87 - 5.11 MIL/uL   Hemoglobin 10.3 (*) 12.0 - 15.0 g/dL   HCT 16.130.0 (*) 09.636.0 - 04.546.0 %   MCV 93.5  78.0 - 100.0 fL   MCH 32.1  26.0 - 34.0 pg   MCHC 34.3  30.0 - 36.0 g/dL   RDW 40.912.6  81.111.5 - 91.415.5 %   Platelets 247  150 - 400 K/uL   Neutrophils Relative % 85 (*) 43 - 77 %   Neutro Abs 7.3  1.7 - 7.7 K/uL   Lymphocytes Relative 8 (*) 12 - 46 %   Lymphs Abs 0.7  0.7 - 4.0 K/uL   Monocytes Relative 5  3 - 12 %   Monocytes Absolute 0.4  0.1 - 1.0 K/uL   Eosinophils Relative 2  0 - 5 %   Eosinophils Absolute 0.1  0.0 - 0.7 K/uL   Basophils Relative 0  0 - 1 %   Basophils Absolute 0.0  0.0 - 0.1 K/uL   2303: Patient has remained afebrile here (temp 99.4 over 5 hours after taking percocet), and CBC is normal. Incision is CDI. Will FU with the office as scheduled or return if fever returns.  Assessment and Plan   1. Status post cesarean section    Afebrile Abdomen non-tender Incision C/D/I Breasts normal Return to MAU as needed Danger signs and return precautions reviewed  Follow-up Information   [facility] (As scheduled)    Specialty:  Obstetrics and Gynecology   Contact information:   [address], [address] [place] [address] [phone]       Tawnya CrookHogan, Heather Donovan 01/26/2014, 10:21 PM

## 2014-01-26 NOTE — MAU Note (Signed)
Pt reports she had a C-section on 07/16, today fever 102, rash on face, headache. States her incision feels hot.

## 2014-01-26 NOTE — MAU Note (Signed)
Pt. States she had a fever today of 102 around 1900. States she took 2 Percocet around 1830. Pt. States she feels warm and has the chills. Also, states she has new back pain. Pt. States she is to have staples removed on Thursday at [facility]. Also, notes a rash on her face that is new. Here for evaluation.

## 2014-01-26 NOTE — Discharge Instructions (Signed)

## 2014-01-28 ENCOUNTER — Ambulatory Visit (INDEPENDENT_AMBULATORY_CARE_PROVIDER_SITE_OTHER): Payer: Medicaid Other | Admitting: Obstetrics

## 2014-01-28 ENCOUNTER — Encounter: Payer: Self-pay | Admitting: Obstetrics

## 2014-01-28 NOTE — Progress Notes (Signed)
Subjective:     Julia Fowler is a 28 y.o. female who presents for a postpartum visit. She is 1 week postpartum following a low cervical transverse Cesarean section. I have fully reviewed the prenatal and intrapartum course. The delivery was at 39 gestational weeks. Outcome: repeat cesarean section, low transverse incision. Anesthesia: spinal. Postpartum course has been normal. Baby's course has been normal. Baby is feeding by not asked. Bleeding thin lochia. Bowel function is normal. Bladder function is normal. Patient is not sexually active. Contraception method is abstinence. Postpartum depression screening: negative.  Tobacco, alcohol and substance abuse history reviewed.  Adult immunizations reviewed including TDAP, rubella and varicella.  The following portions of the patient's history were reviewed and updated as appropriate: allergies, current medications, past family history, past medical history, past social history, past surgical history and problem list.  Review of Systems A comprehensive review of systems was negative.   Objective:    BP 144/89  Pulse 69  Temp(Src) 98.1 F (36.7 C)  Wt 189 lb (85.73 kg)  LMP 04/22/2013  General:  alert and no distress  PE:  Abdomen:  Soft, NT.  Incision C, D, I.                            Staples removed and steri strips applied.   Assessment:     Normal postpartum exam. Pap smear not done at today's visit.  Plan:    1. Contraception: not discussed 2. Continue PNV 3. Follow up in: 2 weeks or as needed.

## 2014-01-29 ENCOUNTER — Encounter: Payer: Medicaid Other | Admitting: Advanced Practice Midwife

## 2014-02-03 ENCOUNTER — Other Ambulatory Visit: Payer: Self-pay | Admitting: Obstetrics

## 2014-02-03 DIAGNOSIS — R52 Pain, unspecified: Secondary | ICD-10-CM

## 2014-02-03 MED ORDER — OXYCODONE-ACETAMINOPHEN 10-325 MG PO TABS: 1.0000 | ORAL_TABLET | Freq: Four times a day (QID) | ORAL | Status: DC | PRN

## 2014-02-09 NOTE — Telephone Encounter (Signed)
error 

## 2014-02-11 ENCOUNTER — Encounter: Payer: Self-pay | Admitting: Obstetrics

## 2014-02-11 ENCOUNTER — Ambulatory Visit (INDEPENDENT_AMBULATORY_CARE_PROVIDER_SITE_OTHER): Payer: Medicaid Other | Admitting: Obstetrics

## 2014-02-11 DIAGNOSIS — Z3009 Encounter for other general counseling and advice on contraception: Secondary | ICD-10-CM

## 2014-02-11 NOTE — Progress Notes (Signed)
Subjective:      Julia Fowler is a 28 y.o. female who presents for a postpartum visit. She is 2 weeks postpartum following a low cervical transverse Cesarean section. I have fully reviewed the prenatal and intrapartum course. The delivery was at 39 gestational weeks. Outcome: repeat cesarean section, low transverse incision. Anesthesia: spinal. Postpartum course has been normal. Baby's course has been normal. Baby is feeding by breast. Bleeding thin lochia. Bowel function is normal. Bladder function is normal. Patient is not sexually active. Contraception method is abstinence. Postpartum depression screening: negative.  Tobacco, alcohol and substance abuse history reviewed.  Adult immunizations reviewed including TDAP, rubella and varicella.  The following portions of the patient's history were reviewed and updated as appropriate: allergies, current medications, past family history, past medical history, past social history, past surgical history and problem list.  Review of Systems A comprehensive review of systems was negative.   Objective:    BP 129/90  Pulse 73  Temp(Src) 97.4 F (36.3 C)  Ht 4\' 11"  (1.499 m)  Wt 182 lb (82.555 kg)  BMI 36.74 kg/m2  Breastfeeding? Yes  PE:      Abdomen:  Soft, NT.  Incision C, D, and intact.    Assessment:     Normal postpartum exam. Pap smear not done at today's visit.  Plan:    1. Contraception: Considering options 2. Continue PNV's 3. Follow up in: 4 weeks or as needed.  2hr GTT for h/o GDM/screening for DM q 3 yrs per ADA recommendations Preconception counseling provided Healthy lifestyle practices reviewed

## 2014-02-12 ENCOUNTER — Encounter: Payer: Self-pay | Admitting: *Deleted

## 2014-03-11 ENCOUNTER — Ambulatory Visit: Payer: Medicaid Other | Admitting: Obstetrics

## 2014-03-25 ENCOUNTER — Telehealth: Payer: Self-pay | Admitting: *Deleted

## 2014-03-25 NOTE — Telephone Encounter (Signed)
Patient called in with pain that is 3/10. Patient does have an appointment 10/6 9:45.  4:16 LM on VM to CB

## 2014-03-29 ENCOUNTER — Encounter (HOSPITAL_COMMUNITY): Payer: Self-pay | Admitting: *Deleted

## 2014-03-29 ENCOUNTER — Inpatient Hospital Stay (HOSPITAL_COMMUNITY)
Admission: AD | Admit: 2014-03-29 | Discharge: 2014-03-29 | Disposition: A | Discharge status: Home / Self Care | Payer: Medicaid Other | Source: Ambulatory Visit | Attending: Obstetrics & Gynecology | Admitting: Obstetrics & Gynecology

## 2014-03-29 DIAGNOSIS — O909 Complication of the puerperium, unspecified: Secondary | ICD-10-CM | POA: Insufficient documentation

## 2014-03-29 DIAGNOSIS — L7682 Other postprocedural complications of skin and subcutaneous tissue: Secondary | ICD-10-CM

## 2014-03-29 DIAGNOSIS — R209 Unspecified disturbances of skin sensation: Secondary | ICD-10-CM

## 2014-03-29 LAB — URINALYSIS, ROUTINE W REFLEX MICROSCOPIC
BILIRUBIN URINE: NEGATIVE
GLUCOSE, UA: NEGATIVE mg/dL
Ketones, ur: NEGATIVE mg/dL
Leukocytes, UA: NEGATIVE
Nitrite: NEGATIVE
PROTEIN: NEGATIVE mg/dL
Specific Gravity, Urine: 1.015 (ref 1.005–1.030)
Urobilinogen, UA: 0.2 mg/dL (ref 0.0–1.0)
pH: 6 (ref 5.0–8.0)

## 2014-03-29 LAB — POCT PREGNANCY, URINE: Preg Test, Ur: NEGATIVE

## 2014-03-29 LAB — URINE MICROSCOPIC-ADD ON

## 2014-03-29 MED ORDER — HYDROCODONE-ACETAMINOPHEN 5-325 MG PO TABS: 1.0000 | ORAL_TABLET | ORAL | Status: DC | PRN

## 2014-03-29 NOTE — MAU Note (Signed)
Abd scar intact, WNL, pt states she pain is on the R side of the incision, started 2 weeks ago.  Stretching & movement makes it worse, also going up stairs

## 2014-03-29 NOTE — MAU Note (Signed)
Patient states she had a cesarean section on 7-16. States she has been having burning pain at the scar line for several weeks when she moves, but is now getting worse and hurting all the time. Patient has not had a period since delivery, is breast feeding.

## 2014-03-29 NOTE — MAU Provider Note (Signed)
History     CSN: [id number]  Arrival date and time: 03/29/14 1048   First Provider Initiated Contact with Patient 03/29/14 1216      Chief Complaint  Patient presents with  . Abdominal Pain   HPI  Ms. Julia Fowler is a 28 y.o. female who had a cesarean section on July 16.  2 weeks ago she developed a sharp pain in her incision site, and says that she can feel something is there. The pain is there all the time especially when she moves or has sudden movements.  The pain is worse when she pushes on the area where the knot is.   RN note: Patient states she had a cesarean section on 7-16. States she has been having burning pain at the scar line for several weeks when she moves, but is now getting worse and hurting all the time. Patient has not had a period since delivery, is breast feeding.     OB History   Grav Para Term Preterm Abortions TAB SAB Ect Mult Living   Past Medical History  Diagnosis Date  . Rickets   . Thyroid disease hypothyroid  . Seasonal allergies   . Hypothyroidism     no medication    Past Surgical History  Procedure Laterality Date  . Cesarean section    . Tooth extraction Left   . Cesarean section N/A 01/21/2014    Procedure: REPEAT CESAREAN SECTION;  Surgeon: Brock Bad, MD;  Location: [facility];  Service: Obstetrics;  Laterality: N/A;    Family History  Problem Relation Age of Onset  . Cancer Sister     breast  . Diabetes Maternal Grandmother     History  Substance Use Topics  . Smoking status: Never Smoker   . Smokeless tobacco: Never Used  . Alcohol Use: No     Comment: None since being pregnant    Allergies:  Allergies  Allergen Reactions  . Other     Cats and dogs     Prescriptions prior to admission  Medication Sig Dispense Refill  . ibuprofen (ADVIL,MOTRIN) 600 MG tablet Take 1 tablet (600 mg total) by mouth every 6 (six) hours as needed.  30 tablet  5  . Prenat-FeCbn-FeAspGl-FA-Omega (OB COMPLETE  PETITE) 35-5-1-200 MG CAPS Take 1 tablet by mouth daily.  90 capsule  4   Results for orders placed during the hospital encounter of 03/29/14 (from the past 48 hour(s))  POCT PREGNANCY, URINE     Status: None   Collection Time    03/29/14 11:56 AM      Result Value Ref Range   Preg Test, Ur NEGATIVE  NEGATIVE   Comment:            THE SENSITIVITY OF THIS     METHODOLOGY IS >24 mIU/mL    Review of Systems  Constitutional: Negative for fever and chills.  Gastrointestinal: Positive for nausea and abdominal pain (In the middle of her stomach; in the inside, along her incision. ). Negative for vomiting.  Genitourinary: Negative for dysuria, urgency and frequency.   Physical Exam   Blood pressure 123/74, pulse 71, temperature 98.9 F (37.2 C), temperature source Oral, resp. rate 16, SpO2 99.00%, currently breastfeeding.  Physical Exam  Constitutional: She is oriented to person, place, and time. She appears well-developed and well-nourished. No distress.  HENT:  Head: Normocephalic.  Eyes: Pupils are equal, round,  and reactive to light.  Neck: Neck supple.  Respiratory: Effort normal.  GI: Soft. Normal appearance. She exhibits no ascites. There is tenderness in the suprapubic area.    Musculoskeletal: Normal range of motion.  Neurological: She is alert and oriented to person, place, and time.  Skin: Skin is warm and dry. She is not diaphoretic.  Psychiatric: Her behavior is normal.    MAU Course  Procedures None   MDM Consulted with Dr. Tamela Oddi. She would like the patient to be seen in the office; patient has an appointment scheduled for 10/6 at 0945; she feels it may be a suture granuloma.  She asked me to RX vicodin and have the patient call the office for a soon appt.    Assessment and Plan   A:  1. Incisional pain ; possible suture granuloma    P:  Discharge home in stable condition  RX: Vicodin Ok to continue taking ibuprofen as needed for pain, as  directed on the bottle  Keep your scheduled appointment with Dr. Tamela Oddi; call the office and see if you can be scheduled earlier.  Return to MAU if symptoms worsen   Iona Hansen Krimson Massmann Payes, NP  03/29/2014, 12:16 PM

## 2014-03-30 ENCOUNTER — Ambulatory Visit: Payer: Medicaid Other | Admitting: Obstetrics

## 2014-03-31 ENCOUNTER — Telehealth: Payer: Self-pay | Admitting: *Deleted

## 2014-03-31 NOTE — Telephone Encounter (Signed)
Patient states she was seen at MAU for incisional pain. Patient has an appointment for follow up in the office on 04/13/2014. Patient states she was told she needs an ultrasound of her incision before her appointment. There is no mention of that in her notes and I can not put an order in for that without need. Can you review and see if that is a need before her appointment,

## 2014-04-03 NOTE — Telephone Encounter (Signed)
Wait on U/S

## 2014-04-05 NOTE — Telephone Encounter (Signed)
Patient notified and advised the provider wants to wait until after in office evaluation before she orders an [place]

## 2014-04-13 ENCOUNTER — Encounter: Payer: Self-pay | Admitting: Obstetrics

## 2014-04-13 ENCOUNTER — Ambulatory Visit (INDEPENDENT_AMBULATORY_CARE_PROVIDER_SITE_OTHER): Payer: Medicaid Other | Admitting: Obstetrics

## 2014-04-13 VITALS — BP 119/81 | HR 67 | Temp 98.0°F | Ht 59.0 in | Wt 182.2 lb

## 2014-04-13 DIAGNOSIS — Z30011 Encounter for initial prescription of contraceptive pills: Secondary | ICD-10-CM

## 2014-04-13 DIAGNOSIS — Z3202 Encounter for pregnancy test, result negative: Secondary | ICD-10-CM

## 2014-04-13 LAB — POCT URINE PREGNANCY: PREG TEST UR: NEGATIVE

## 2014-04-13 MED ORDER — NORETHINDRONE 0.35 MG PO TABS: 1.0000 | ORAL_TABLET | Freq: Every day | ORAL | Status: DC

## 2014-04-13 NOTE — Addendum Note (Signed)
Addended by: Marya LandryFOSTER, Osten Janek D on: 04/13/2014 02:02 PM   Modules accepted: Orders

## 2014-04-13 NOTE — Progress Notes (Signed)
Subjective:     Julia Fowler is a 28 y.o. female who presents for a postpartum visit. She is 11 weeks postpartum following a low cervical transverse Cesarean section. I have fully reviewed the prenatal and intrapartum course. The delivery was at 39 gestational weeks. Outcome: repeat cesarean section, low transverse incision. Anesthesia: spinal. Postpartum course has been normal. Baby's course has been normal. Baby is feeding by breast. Bleeding no bleeding. Bowel function is normal. Bladder function is normal. Patient is sexually active. Contraception method is condoms. Postpartum depression screening: negative.  Tobacco, alcohol and substance abuse history reviewed.  Adult immunizations reviewed including TDAP, rubella and varicella.  The following portions of the patient's history were reviewed and updated as appropriate: allergies, current medications, past family history, past medical history, past social history, past surgical history and problem list.  Review of Systems A comprehensive review of systems was negative.   Objective:    BP 119/81  Pulse 67  Temp(Src) 98 F (36.7 C)  Ht 4\' 11"  (1.499 m)  Wt 182 lb 3.2 oz (82.645 kg)  BMI 36.78 kg/m2  LMP 02/24/2014  Breastfeeding? Yes  General:  alert and no distress   Breasts:  inspection negative, no nipple discharge or bleeding, no masses or nodularity palpable  Lungs: clear to auscultation bilaterally  Heart:  regular rate and rhythm, S1, S2 normal, no murmur, click, rub or gallop  Abdomen: normal findings: soft, non-tender and incision C, D, I.   Vulva:  normal  Vagina: normal vagina  Cervix:  no cervical motion tenderness  Corpus: normal size, contour, position, consistency, mobility, non-tender  Adnexa:  no mass, fullness, tenderness  Rectal Exam: Not performed.           Assessment:     Normal postpartum exam. Pap smear not done at today's visit.  Plan:    1. Contraception: oral progesterone-only contraceptive 2.  Micronor Rx 3. Follow up in: 3 months or as needed.   Healthy lifestyle practices reviewed

## 2014-04-14 LAB — WET PREP BY MOLECULAR PROBE
CANDIDA SPECIES: NEGATIVE
Gardnerella vaginalis: NEGATIVE
Trichomonas vaginosis: NEGATIVE

## 2014-05-10 ENCOUNTER — Encounter: Payer: Self-pay | Admitting: Obstetrics

## 2014-07-05 ENCOUNTER — Encounter: Payer: Self-pay | Admitting: *Deleted

## 2014-07-06 ENCOUNTER — Encounter: Payer: Self-pay | Admitting: Obstetrics & Gynecology

## 2014-07-07 ENCOUNTER — Telehealth: Payer: Self-pay | Admitting: *Deleted

## 2014-07-07 NOTE — Telephone Encounter (Signed)
Pt called to office stating that she was started on birth control pill for breastfeeding.  Pt states that she is now having back pain and would like to know if this could be related. Return call to pt.  No answer, LM on VM that medication should not cause back pain and pt should contact office.

## 2014-07-19 NOTE — Telephone Encounter (Signed)
Call to patient to follow up on symptoms. LM on VM to CB if needed- re filed call.

## 2014-09-01 ENCOUNTER — Emergency Department (HOSPITAL_COMMUNITY): Payer: Medicaid Other

## 2014-09-01 ENCOUNTER — Emergency Department (HOSPITAL_COMMUNITY)
Admission: EM | Admit: 2014-09-01 | Discharge: 2014-09-01 | Disposition: A | Discharge status: Home / Self Care | Payer: Medicaid Other | Attending: Emergency Medicine | Admitting: Emergency Medicine

## 2014-09-01 ENCOUNTER — Encounter (HOSPITAL_COMMUNITY): Payer: Self-pay | Admitting: Family Medicine

## 2014-09-01 DIAGNOSIS — M25562 Pain in left knee: Secondary | ICD-10-CM | POA: Diagnosis present

## 2014-09-01 DIAGNOSIS — Z8639 Personal history of other endocrine, nutritional and metabolic disease: Secondary | ICD-10-CM | POA: Insufficient documentation

## 2014-09-01 DIAGNOSIS — Z79899 Other long term (current) drug therapy: Secondary | ICD-10-CM | POA: Insufficient documentation

## 2014-09-01 DIAGNOSIS — M25462 Effusion, left knee: Secondary | ICD-10-CM | POA: Diagnosis not present

## 2014-09-01 MED ORDER — IBUPROFEN 800 MG PO TABS: 800.0000 mg | ORAL_TABLET | Freq: Three times a day (TID) | ORAL | Status: DC

## 2014-09-01 NOTE — Discharge Instructions (Signed)

## 2014-09-01 NOTE — ED Provider Notes (Signed)
CSN: [id number]     Arrival date & time 09/01/14  1423 History  This chart was scribed for non-physician practitioner, Lonia Skinner. Keenan Bachelor, PA-C working with Ethelda Chick, MD by Gwenyth Ober, ED scribe. This patient was seen in room TR06C/TR06C and the patient's care was started at 4:22 PM  Chief Complaint  Patient presents with  . Knee Pain   The history is provided by the patient. No language interpreter was used.   HPI Comments: ERLINE SIDDOWAY is a 29 y.o. female who presents to the Emergency Department complaining of constant, sharp, moderate, upper left knee pain that started 2 weeks ago. She notes pain becomes worse with extension or lifting of her left leg. Pt denies recent injuries or falls. She also denies knee swelling.  Past Medical History  Diagnosis Date  . Rickets   . Thyroid disease hypothyroid  . Seasonal allergies   . Hypothyroidism     no medication   Past Surgical History  Procedure Laterality Date  . Cesarean section    . Tooth extraction Left   . Cesarean section N/A 01/21/2014    Procedure: REPEAT CESAREAN SECTION;  Surgeon: Brock Bad, MD;  Location: [facility];  Service: Obstetrics;  Laterality: N/A;   Family History  Problem Relation Age of Onset  . Cancer Sister     breast  . Diabetes Maternal Grandmother    History  Substance Use Topics  . Smoking status: Never Smoker   . Smokeless tobacco: Never Used  . Alcohol Use: No     Comment: None since being pregnant   OB History    Gravida Para Term Preterm AB TAB SAB Ectopic Multiple Living   Review of Systems  Musculoskeletal: Positive for arthralgias. Negative for joint swelling.  Skin: Negative for wound.  All other systems reviewed and are negative.     Allergies  Other  Home Medications   Prior to Admission medications   Medication Sig Start Date End Date Taking? Authorizing Provider  ibuprofen (ADVIL,MOTRIN) 600 MG tablet Take 1 tablet (600 mg total) by mouth  every 6 (six) hours as needed. 01/24/14   Brock Bad, MD  norethindrone (MICRONOR,CAMILA,ERRIN) 0.35 MG tablet Take 1 tablet (0.35 mg total) by mouth daily. 04/13/14   Brock Bad, MD  Prenat-FeCbn-FeAspGl-FA-Omega (OB COMPLETE PETITE) 35-5-1-200 MG CAPS Take 1 tablet by mouth daily. 07/14/13   Amy Dessa Phi, CNM   BP 120/48 mmHg  Pulse 89  Temp(Src) 97.8 F (36.6 C) (Oral)  Resp 18  Ht  (1.499 m)  Wt 174 lb (78.926 kg)  BMI 35.13 kg/m2  SpO2 98%  LMP 08/07/2014 Physical Exam  Constitutional: She appears well-developed and well-nourished. No distress.  HENT:  Head: Normocephalic and atraumatic.  Eyes: Conjunctivae and EOM are normal.  Neck: Neck supple. No tracheal deviation present.  Cardiovascular: Normal rate.   Pulmonary/Chest: Effort normal. No respiratory distress.  Musculoskeletal: She exhibits no tenderness.  Tender quadriceps tendon; tender bilateral knee joint  Skin: Skin is warm and dry.  Psychiatric: She has a normal mood and affect. Her behavior is normal.  Nursing note and vitals reviewed.   ED Course  Procedures  DIAGNOSTIC STUDIES: Oxygen Saturation is 98% on RA, normal by my interpretation.    COORDINATION OF CARE: 4:26 PM Discussed x-ray results and treatment plan with pt at bedside. She agreed to plan.    Labs Review  Labs Reviewed - No data to display  Imaging Review Dg Knee Complete 4 Views Left  09/01/2014   CLINICAL DATA:  Anterior left knee pain for 2 weeks, history of rickets  EXAM: LEFT KNEE - COMPLETE 4+ VIEW  COMPARISON:  None.  FINDINGS: No acute fracture or dislocation is noted. Some degenerative changes are noted in both the medial and lateral joint space. No gross soft tissue abnormality is noted.  IMPRESSION: No acute abnormality seen.   Electronically Signed   By: Alcide CleverMark  Lukens M.D.   On: 09/01/2014 15:51     EKG Interpretation None      MDM   Final diagnoses:  Knee effusion, left   Knee  immbolizer Ibuprofen  Schedule to see Dr. Eulah PontMurphy for evaluation in 1 week  Elson AreasLeslie K Marshella Tello, PA-C 09/01/14 1648  Ethelda ChickMartha K Linker, MD 09/01/14 [phone]

## 2014-09-01 NOTE — ED Notes (Signed)
Pt sts left knee pain x 2 weeks. sts hurts when she stretches her leg out. denies injury

## 2014-09-14 ENCOUNTER — Other Ambulatory Visit: Payer: Self-pay | Admitting: *Deleted

## 2014-09-14 DIAGNOSIS — M25462 Effusion, left knee: Secondary | ICD-10-CM

## 2014-09-15 ENCOUNTER — Telehealth: Payer: Self-pay

## 2014-09-15 NOTE — Telephone Encounter (Signed)
Patient has appt with [facility], Dr. Frederico Hammananiel Caffrey 09/20/14 at 10:15am

## 2014-09-20 ENCOUNTER — Telehealth: Payer: Self-pay

## 2014-09-20 NOTE — Telephone Encounter (Signed)
Patient had incorrect phone number so message left for appt to [facility] did not get to her - gave patient their number to reschedule - also got patient;s correct phone number

## 2015-02-10 ENCOUNTER — Encounter: Payer: Self-pay | Admitting: Obstetrics

## 2015-02-10 ENCOUNTER — Ambulatory Visit (INDEPENDENT_AMBULATORY_CARE_PROVIDER_SITE_OTHER): Payer: Medicaid Other | Admitting: Obstetrics

## 2015-02-10 VITALS — BP 105/81 | HR 78 | Temp 98.1°F | Ht 59.0 in | Wt 193.8 lb

## 2015-02-10 DIAGNOSIS — N939 Abnormal uterine and vaginal bleeding, unspecified: Secondary | ICD-10-CM

## 2015-02-10 DIAGNOSIS — R102 Pelvic and perineal pain: Secondary | ICD-10-CM | POA: Diagnosis not present

## 2015-02-10 DIAGNOSIS — E669 Obesity, unspecified: Secondary | ICD-10-CM

## 2015-02-10 DIAGNOSIS — N941 Dyspareunia: Secondary | ICD-10-CM

## 2015-02-10 DIAGNOSIS — IMO0002 Reserved for concepts with insufficient information to code with codable children: Secondary | ICD-10-CM

## 2015-02-10 MED ORDER — OXYCODONE HCL 10 MG PO TABS: 10.0000 mg | ORAL_TABLET | Freq: Four times a day (QID) | ORAL | Status: DC | PRN

## 2015-02-10 NOTE — Progress Notes (Signed)
Patient ID: Julia Fowler, female   DOB: 1986-06-18, 29 y.o.   MRN: [id number]  Chief Complaint  Patient presents with  . Sexual Problem    HPI Julia Fowler is a 29 y.o. female.  Spotting after intercourse.  Painful intercourse.  Pelvic pain that radiates around to back.  HPI  Past Medical History  Diagnosis Date  . Rickets   . Thyroid disease hypothyroid  . Seasonal allergies   . Hypothyroidism     no medication    Past Surgical History  Procedure Laterality Date  . Cesarean section    . Tooth extraction Left   . Cesarean section N/A 01/21/2014    Procedure: REPEAT CESAREAN SECTION;  Surgeon: Brock Bad, MD;  Location: [facility];  Service: Obstetrics;  Laterality: N/A;    Family History  Problem Relation Age of Onset  . Cancer Sister     breast  . Diabetes Maternal Grandmother     Social History History  Substance Use Topics  . Smoking status: Never Smoker   . Smokeless tobacco: Never Used  . Alcohol Use: Yes     Comment: None since being pregnant    Allergies  Allergen Reactions  . Other     Cats and dogs     Current Outpatient Prescriptions  Medication Sig Dispense Refill  . ibuprofen (ADVIL,MOTRIN) 800 MG tablet Take 1 tablet (800 mg total) by mouth 3 (three) times daily. 21 tablet 0  . norethindrone (MICRONOR,CAMILA,ERRIN) 0.35 MG tablet Take 1 tablet (0.35 mg total) by mouth daily. (Patient not taking: Reported on 02/10/2015) 1 Package 11  . Oxycodone HCl 10 MG TABS Take 1 tablet (10 mg total) by mouth every 6 (six) hours as needed. 40 tablet 0  . Prenat-FeCbn-FeAspGl-FA-Omega (OB COMPLETE PETITE) 35-5-1-200 MG CAPS Take 1 tablet by mouth daily. (Patient not taking: Reported on 02/10/2015) 90 capsule 4   No current facility-administered medications for this visit.    Review of Systems Review of Systems Constitutional: negative for fatigue and weight loss Respiratory: negative for cough and wheezing Cardiovascular: negative for chest pain, fatigue and  palpitations Gastrointestinal: negative for abdominal pain and change in bowel habits Genitourinary: postcoital bleeding, painful intercourse Integument/breast: negative for nipple discharge Musculoskeletal:negative for myalgias Neurological: negative for gait problems and tremors Behavioral/Psych: negative for abusive relationship, depression Endocrine: negative for temperature intolerance     Blood pressure 105/81, pulse 78, temperature 98.1 F (36.7 C), height  (1.499 m), weight 193 lb 12.8 oz (87.907 kg), last menstrual period 02/01/2015, currently breastfeeding.  Physical Exam Physical Exam           General:  Alert and no distress Abdomen:  normal findings: no organomegaly, soft, non-tender and no hernia  Pelvis:  External genitalia: normal general appearance Urinary system: urethral meatus normal and bladder without fullness, nontender Vaginal: normal without tenderness, induration or masses Cervix: normal appearance Adnexa: normal bimanual exam Uterus: anteverted and tender, normal size      Data Reviewed Labs  Assessment     AUB.  Postcoital bleeding. Dyspareunia Obesity     Plan    Ultrasound ordered Oxycodone Rx Dietary changes and exercise recommended  F/U in 2 weeks   Orders Placed This Encounter  Procedures  . SureSwab, Vaginosis/Vaginitis Plus  . US Transvaginal Non-OB    Standing Status: Future     Number of Occurrences:      Standing Expiration Date: 04/11/2016    Order Specific Question:  Reason for Exam (SYMPTOM  OR DIAGNOSIS REQUIRED)    Answer:  pelvic pain    Order Specific Question:  Preferred imaging location?    Answer:  [facility]  . US Pelvis Complete    Standing Status: Future     Number of Occurrences:      Standing Expiration Date: 04/11/2016    Order Specific Question:  Reason for Exam (SYMPTOM  OR DIAGNOSIS REQUIRED)    Answer:  pelvic pain    Order Specific Question:  Preferred imaging location?    Answer:  [facility]   Meds ordered this encounter  Medications  . Oxycodone HCl 10 MG TABS    Sig: Take 1 tablet (10 mg total) by mouth every 6 (six) hours as needed.    Dispense:  40 tablet    Refill:  0

## 2015-02-13 LAB — SURESWAB, VAGINOSIS/VAGINITIS PLUS
Atopobium vaginae: NOT DETECTED Log (cells/mL)
C. ALBICANS, DNA: NOT DETECTED
C. GLABRATA, DNA: NOT DETECTED
C. parapsilosis, DNA: NOT DETECTED
C. trachomatis RNA, TMA: NOT DETECTED
C. tropicalis, DNA: NOT DETECTED
Gardnerella vaginalis: NOT DETECTED Log (cells/mL)
LACTOBACILLUS SPECIES: 7.2 Log (cells/mL)
MEGASPHAERA SPECIES: NOT DETECTED Log (cells/mL)
N. gonorrhoeae RNA, TMA: NOT DETECTED
T. vaginalis RNA, QL TMA: NOT DETECTED

## 2015-02-25 ENCOUNTER — Encounter: Payer: Self-pay | Admitting: Obstetrics

## 2015-02-25 ENCOUNTER — Ambulatory Visit (INDEPENDENT_AMBULATORY_CARE_PROVIDER_SITE_OTHER): Payer: Medicaid Other | Admitting: Obstetrics

## 2015-02-25 ENCOUNTER — Ambulatory Visit (HOSPITAL_COMMUNITY)
Admission: RE | Admit: 2015-02-25 | Discharge: 2015-02-25 | Disposition: A | Discharge status: Home / Self Care | Payer: Medicaid Other | Source: Ambulatory Visit | Attending: Obstetrics | Admitting: Obstetrics

## 2015-02-25 VITALS — BP 122/80 | HR 75 | Temp 98.2°F | Wt 198.0 lb

## 2015-02-25 DIAGNOSIS — M549 Dorsalgia, unspecified: Secondary | ICD-10-CM | POA: Insufficient documentation

## 2015-02-25 DIAGNOSIS — R102 Pelvic and perineal pain: Secondary | ICD-10-CM | POA: Diagnosis present

## 2015-02-25 DIAGNOSIS — Z0000 Encounter for general adult medical examination without abnormal findings: Secondary | ICD-10-CM

## 2015-02-25 DIAGNOSIS — Z30018 Encounter for initial prescription of other contraceptives: Secondary | ICD-10-CM

## 2015-02-25 DIAGNOSIS — N939 Abnormal uterine and vaginal bleeding, unspecified: Secondary | ICD-10-CM | POA: Diagnosis not present

## 2015-02-25 MED ORDER — OB COMPLETE PETITE 35-5-1-200 MG PO CAPS: 1.0000 | ORAL_CAPSULE | Freq: Every day | ORAL | Status: DC

## 2015-02-25 MED ORDER — NORELGESTROMIN-ETH ESTRADIOL 150-35 MCG/24HR TD PTWK: 1.0000 | MEDICATED_PATCH | TRANSDERMAL | Status: DC

## 2015-02-25 NOTE — Progress Notes (Signed)
Patient ID: Julia Fowler, female   DOB: [birth year], 29 y.o.   MRN: [id number]  Chief Complaint  Patient presents with  . Follow-up    AUB    HPI Julia Fowler is a 29 y.o. female.  Heavy and painful periods.  HPI  Past Medical History  Diagnosis Date  . Rickets   . Thyroid disease hypothyroid  . Seasonal allergies   . Hypothyroidism     no medication    Past Surgical History  Procedure Laterality Date  . Cesarean section    . Tooth extraction Left   . Cesarean section N/A 01/21/2014    Procedure: REPEAT CESAREAN SECTION;  Surgeon: Brock Bad, MD;  Location: [facility];  Service: Obstetrics;  Laterality: N/A;    Family History  Problem Relation Age of Onset  . Cancer Sister     breast  . Diabetes Maternal Grandmother     Social History Social History  Substance Use Topics  . Smoking status: Never Smoker   . Smokeless tobacco: Never Used  . Alcohol Use: Yes     Comment: None since being pregnant    Allergies  Allergen Reactions  . Other     Cats and dogs     Current Outpatient Prescriptions  Medication Sig Dispense Refill  . ibuprofen (ADVIL,MOTRIN) 800 MG tablet Take 1 tablet (800 mg total) by mouth 3 (three) times daily. 21 tablet 0  . Oxycodone HCl 10 MG TABS Take 1 tablet (10 mg total) by mouth every 6 (six) hours as needed. 40 tablet 0  . norethindrone (MICRONOR,CAMILA,ERRIN) 0.35 MG tablet Take 1 tablet (0.35 mg total) by mouth daily. (Patient not taking: Reported on 02/10/2015) 1 Package 11  . Prenat-FeCbn-FeAspGl-FA-Omega (OB COMPLETE PETITE) 35-5-1-200 MG CAPS Take 1 tablet by mouth daily. (Patient not taking: Reported on 02/10/2015) 90 capsule 4   No current facility-administered medications for this visit.    Review of Systems Review of Systems Constitutional: negative for fatigue and weight loss Respiratory: negative for cough and wheezing Cardiovascular: negative for chest pain, fatigue and palpitations Gastrointestinal: negative for abdominal  pain and change in bowel habits Genitourinary: heavy and painful periods Integument/breast: negative for nipple discharge Musculoskeletal:negative for myalgias Neurological: negative for gait problems and tremors Behavioral/Psych: negative for abusive relationship, depression Endocrine: negative for temperature intolerance     Blood pressure 122/80, pulse 75, temperature 98.2 F (36.8 C), weight 198 lb (89.812 kg), last menstrual period 02/01/2015, currently breastfeeding.  Physical Exam Physical Exam            General:  Alert and no distress Abdomen:  normal findings: no organomegaly, soft, non-tender and no hernia  Pelvis:  External genitalia: normal general appearance Urinary system: urethral meatus normal and bladder without fullness, nontender Vaginal: normal without tenderness, induration or masses Cervix: normal appearance Adnexa: normal bimanual exam Uterus: anteverted and non-tender, normal size      Data Reviewed Labs Ultrasound  Assessment     AUB - hormonal imbalance      Plan    OCP's Rx for cycle regulation F/U in 2 months for annual and pap  No orders of the defined types were placed in this encounter.   No orders of the defined types were placed in this encounter.

## 2015-03-03 ENCOUNTER — Ambulatory Visit: Payer: Medicaid Other | Attending: Physical Therapy | Admitting: Physical Therapy

## 2015-03-08 ENCOUNTER — Telehealth: Payer: Self-pay | Admitting: *Deleted

## 2015-03-08 NOTE — Telephone Encounter (Signed)
Patient request call back 2:21 LM on VM to CB

## 2015-03-08 NOTE — Telephone Encounter (Signed)
error 

## 2015-03-08 NOTE — Telephone Encounter (Signed)
2:28 Patient returned call- she is having same problem as before and would like a refill on her pain medication. 4:32 Bad connection

## 2015-03-09 NOTE — Telephone Encounter (Signed)
Patient is requesting a refill of her pain medication for abdominal pain- ? results of [place] - patient is not using any type of cycle at this time. Discussed the fact that we do not Rx long term pain medication as treatment for cycles and we need to formulate a plan to get control over the pain. Will have provider call her to discuss her options.

## 2015-04-13 ENCOUNTER — Ambulatory Visit: Payer: Medicaid Other | Attending: Physical Therapy | Admitting: Physical Therapy

## 2015-04-13 ENCOUNTER — Ambulatory Visit: Payer: Medicaid Other | Admitting: Physical Therapy

## 2015-04-15 ENCOUNTER — Other Ambulatory Visit (INDEPENDENT_AMBULATORY_CARE_PROVIDER_SITE_OTHER): Payer: Medicaid Other

## 2015-04-15 ENCOUNTER — Encounter: Payer: Self-pay | Admitting: Obstetrics

## 2015-04-15 VITALS — BP 106/64 | HR 84 | Temp 98.2°F | Wt 201.0 lb

## 2015-04-15 DIAGNOSIS — Z3201 Encounter for pregnancy test, result positive: Secondary | ICD-10-CM | POA: Diagnosis not present

## 2015-04-15 DIAGNOSIS — N926 Irregular menstruation, unspecified: Secondary | ICD-10-CM

## 2015-04-15 LAB — POCT URINE PREGNANCY: PREG TEST UR: POSITIVE — AB

## 2015-04-15 NOTE — Progress Notes (Signed)
Patient in office for a pregnancy test. Patient states she had a positive home pregnancy test. Pregnancy Test in office is positive. Patient states this is a planned pregnancy. Patient intends on continuing the pregnancy. Patient advised to start prenatal vitamins and to schedule NOB appointment. Patient advised to use [facility] for all emergent needs.   BP 106/64 mmHg  Pulse 84  Temp(Src) 98.2 F (36.8 C)  Wt 201 lb (91.173 kg)  LMP 03/04/2015

## 2015-04-23 ENCOUNTER — Encounter (HOSPITAL_COMMUNITY): Payer: Self-pay | Admitting: Emergency Medicine

## 2015-04-23 ENCOUNTER — Emergency Department (HOSPITAL_COMMUNITY)
Admission: EM | Admit: 2015-04-23 | Discharge: 2015-04-23 | Disposition: A | Discharge status: Home / Self Care | Payer: Medicaid Other | Attending: Emergency Medicine | Admitting: Emergency Medicine

## 2015-04-23 ENCOUNTER — Emergency Department (HOSPITAL_COMMUNITY): Payer: Medicaid Other

## 2015-04-23 DIAGNOSIS — Z3A08 8 weeks gestation of pregnancy: Secondary | ICD-10-CM | POA: Diagnosis not present

## 2015-04-23 DIAGNOSIS — Z8639 Personal history of other endocrine, nutritional and metabolic disease: Secondary | ICD-10-CM | POA: Diagnosis not present

## 2015-04-23 DIAGNOSIS — O4690 Antepartum hemorrhage, unspecified, unspecified trimester: Secondary | ICD-10-CM

## 2015-04-23 DIAGNOSIS — Z791 Long term (current) use of non-steroidal anti-inflammatories (NSAID): Secondary | ICD-10-CM | POA: Insufficient documentation

## 2015-04-23 DIAGNOSIS — R1030 Lower abdominal pain, unspecified: Secondary | ICD-10-CM | POA: Diagnosis not present

## 2015-04-23 DIAGNOSIS — IMO0001 Reserved for inherently not codable concepts without codable children: Secondary | ICD-10-CM

## 2015-04-23 DIAGNOSIS — O209 Hemorrhage in early pregnancy, unspecified: Secondary | ICD-10-CM | POA: Diagnosis not present

## 2015-04-23 DIAGNOSIS — O30041 Twin pregnancy, dichorionic/diamniotic, first trimester: Secondary | ICD-10-CM | POA: Diagnosis not present

## 2015-04-23 DIAGNOSIS — O9989 Other specified diseases and conditions complicating pregnancy, childbirth and the puerperium: Secondary | ICD-10-CM | POA: Diagnosis present

## 2015-04-23 DIAGNOSIS — Z79899 Other long term (current) drug therapy: Secondary | ICD-10-CM | POA: Insufficient documentation

## 2015-04-23 LAB — HCG, QUANTITATIVE, PREGNANCY: HCG, BETA CHAIN, QUANT, S: 483995 m[IU]/mL — AB (ref <5)

## 2015-04-23 LAB — ABO/RH: ABO/RH(D): A POS

## 2015-04-23 MED ORDER — ACETAMINOPHEN 500 MG PO TABS
1000.0000 mg | ORAL_TABLET | Freq: Once | ORAL | Status: AC
  Administered 2015-04-23: 1000 mg via ORAL
  Filled 2015-04-23: qty 2

## 2015-04-23 NOTE — ED Notes (Signed)
Pt. reports vaginal bleeding onset this morning , denies abdominal pain or cramping , mild hypogastric pressure , pt. stated she is [redacted] weeks pregnant ( G3P2 ) , denies dysuria / no fever or chills.

## 2015-04-23 NOTE — ED Provider Notes (Signed)
CSN: [id number]     Arrival date & time 04/23/15  0426 History   First MD Initiated Contact with Patient 04/23/15 0434     Chief Complaint  Patient presents with  . Vaginal Bleeding     (Consider location/radiation/quality/duration/timing/severity/associated sxs/prior Treatment) HPI  Julia Fowler is a 29 y.o. female with no significant past medical history presenting today with vaginal bleeding. She has had 2 pregnancies in the past, she states she is [redacted] weeks pregnant currently. She has had abdominal pressure throughout her pregnancy but had cramping last night. She then woke up to use the restroom and she wiped and saw blood. She states she subsequently passed blood clots. She continues to have cramping currently. She denies any vomiting. She denies any fevers or recent infections. She's had no problems with her prior pregnancies.  She has no further complaints.  10 Systems reviewed and are negative for acute change except as noted in the HPI.    Past Medical History  Diagnosis Date  . Rickets   . Thyroid disease hypothyroid  . Seasonal allergies   . Hypothyroidism     no medication   Past Surgical History  Procedure Laterality Date  . Cesarean section    . Tooth extraction Left   . Cesarean section N/A 01/21/2014    Procedure: REPEAT CESAREAN SECTION;  Surgeon: Brock Bad, MD;  Location: [facility];  Service: Obstetrics;  Laterality: N/A;   Family History  Problem Relation Age of Onset  . Cancer Sister     breast  . Diabetes Maternal Grandmother    Social History  Substance Use Topics  . Smoking status: Never Smoker   . Smokeless tobacco: Never Used  . Alcohol Use: Yes   OB History    Gravida Para Term Preterm AB TAB SAB Ectopic Multiple Living   Review of Systems    Allergies  Other  Home Medications   Prior to Admission medications   Medication Sig Start Date End Date Taking? Authorizing Provider  ibuprofen (ADVIL,MOTRIN) 800 MG  tablet Take 1 tablet (800 mg total) by mouth 3 (three) times daily. 09/01/14   Elson Areas, PA-C  norelgestromin-ethinyl estradiol (ORTHO EVRA) 150-35 MCG/24HR transdermal patch Place 1 patch onto the skin once a week. 02/25/15   Brock Bad, MD  norethindrone (MICRONOR,CAMILA,ERRIN) 0.35 MG tablet Take 1 tablet (0.35 mg total) by mouth daily. Patient not taking: Reported on 02/10/2015 04/13/14   Brock Bad, MD  Oxycodone HCl 10 MG TABS Take 1 tablet (10 mg total) by mouth every 6 (six) hours as needed. 02/10/15   Brock Bad, MD  Prenat-FeCbn-FeAspGl-FA-Omega (OB COMPLETE PETITE) 35-5-1-200 MG CAPS Take 1 tablet by mouth daily. 02/25/15   Brock Bad, MD   BP 145/83 mmHg  Pulse 80  Temp(Src) 99.1 F (37.3 C) (Oral)  Resp 16  Ht  (1.499 m)  Wt 200 lb (90.719 kg)  BMI 40.37 kg/m2  SpO2 99%  LMP 03/04/2015 Physical Exam  Constitutional: She is oriented to person, place, and time. She appears well-developed and well-nourished. No distress.  HENT:  Head: Normocephalic and atraumatic.  Nose: Nose normal.  Mouth/Throat: Oropharynx is clear and moist. No oropharyngeal exudate.  Eyes: Conjunctivae and EOM are normal. Pupils are equal, round, and reactive to light. No scleral icterus.  Neck: Normal range of motion. Neck supple. No JVD present. No tracheal deviation present. No  thyromegaly present.  Cardiovascular: Normal rate, regular rhythm and normal heart sounds.  Exam reveals no gallop and no friction rub.   No murmur heard. Pulmonary/Chest: Effort normal and breath sounds normal. No respiratory distress. She has no wheezes. She exhibits no tenderness.  Abdominal: Soft. Bowel sounds are normal. She exhibits no distension and no mass. There is tenderness. There is no rebound and no guarding.  Suprapubic tenderness to palpation.  Musculoskeletal: Normal range of motion. She exhibits no edema or tenderness.  Lymphadenopathy:    She has no cervical adenopathy.   Neurological: She is alert and oriented to person, place, and time. No cranial nerve deficit. She exhibits normal muscle tone.  Skin: Skin is warm and dry. No rash noted. No erythema. No pallor.  Nursing note and vitals reviewed.   ED Course  Procedures (including critical care time) Labs Review Labs Reviewed  HCG, QUANTITATIVE, PREGNANCY - Abnormal; Notable for the following:    hCG, Beta Francene FindersChain, Quant, S [phone] (*)    All other components within normal limits  ABO/RH    Imaging Review No results found. I have personally reviewed and evaluated these images and lab results as part of my medical decision-making.   IMPRESSION: Twin intrauterine gestational with separate intrauterine gestational sacs consistent with dichorionic diamniotic twin pregnancy. Size is concordant with twin 1 measuring consistent with 7 weeks 2 days gestation and twin 2 measuring consistent with 7 weeks 4 days gestational. No acute complication is suggested.   EKG Interpretation None      MDM   Final diagnoses:  None   patient presents to the emergency department for evaluation of vaginal bleeding in the setting of pregnancy. Will obtain ultrasound to evaluate for spontaneous abortion versus ectopic pregnancy versus threatened abortion. Patient is given Tylenol for pain control.  US reveals twins, patient was informed of diagnosis.  Live IUP present no ectopic. OB fu given.  Tomasita CrumbleAdeleke Autumne Kallio, MD 04/27/15 1504

## 2015-04-23 NOTE — Discharge Instructions (Signed)
Threatened Miscarriage Ms. Zachery DauerBarnes, your ultrasound shows that you are going to have twins. See OB/GYN within 3 days for close follow-up. For any worsening symptoms come back to the emergency department immediately. Thank you. A threatened miscarriage is when you have vaginal bleeding during your first 20 weeks of pregnancy but the pregnancy has not ended. Your doctor will do tests to make sure you are still pregnant. The cause of the bleeding may not be known. This condition does not mean your pregnancy will end. It does increase the risk of it ending (complete miscarriage). HOME CARE   Make sure you keep all your doctor visits for prenatal care.  Get plenty of rest.  Do not have sex or use tampons if you have vaginal bleeding.  Do not douche.  Do not smoke or use drugs.  Do not drink alcohol.  Avoid caffeine. GET HELP IF:  You have light bleeding from your vagina.  You have belly pain or cramping.  You have a fever. GET HELP RIGHT AWAY IF:   You have heavy bleeding from your vagina.  You have clots of blood coming from your vagina.  You have bad pain or cramps in your low back or belly.  You have fever, chills, and bad belly pain. MAKE SURE YOU:   Understand these instructions.  Will watch your condition.  Will get help right away if you are not doing well or get worse.   This information is not intended to replace advice given to you by your health care provider. Make sure you discuss any questions you have with your health care provider.   Document Released: 06/07/2008 Document Revised: 06/30/2013 Document Reviewed: 04/21/2013 Elsevier Interactive Patient Education [employer]2016 [employer]

## 2015-04-23 NOTE — ED Notes (Signed)
Patient transported to Ultrasound 

## 2015-04-27 ENCOUNTER — Ambulatory Visit: Payer: Medicaid Other | Admitting: Obstetrics

## 2015-05-03 ENCOUNTER — Ambulatory Visit (INDEPENDENT_AMBULATORY_CARE_PROVIDER_SITE_OTHER): Payer: Medicaid Other | Admitting: Obstetrics

## 2015-05-03 ENCOUNTER — Encounter: Payer: Self-pay | Admitting: Obstetrics

## 2015-05-03 VITALS — BP 108/70 | HR 92 | Temp 98.4°F | Wt 206.0 lb

## 2015-05-03 DIAGNOSIS — O30091 Twin pregnancy, unable to determine number of placenta and number of amniotic sacs, first trimester: Secondary | ICD-10-CM

## 2015-05-03 LAB — POCT URINALYSIS DIPSTICK
Bilirubin, UA: NEGATIVE
GLUCOSE UA: NEGATIVE
Ketones, UA: NEGATIVE
Leukocytes, UA: NEGATIVE
NITRITE UA: NEGATIVE
PH UA: 6
RBC UA: 250
Spec Grav, UA: 1.015
UROBILINOGEN UA: NEGATIVE

## 2015-05-03 MED ORDER — FOLIC ACID 1 MG PO TABS: 1.0000 mg | ORAL_TABLET | Freq: Every day | ORAL | Status: DC

## 2015-05-03 NOTE — Progress Notes (Signed)
Subjective:    Julia Fowler is being seen today for her first obstetrical visit.  This is not a planned pregnancy. She is at [redacted]w[redacted]d gestation. Her obstetrical history is significant for obesity. Relationship with FOB: significant other, living together. Patient does intend to breast feed. Pregnancy history fully reviewed.  The information documented in the HPI was reviewed and verified.  Menstrual History: OB History    Gravida Para Term Preterm AB TAB SAB Ectopic Multiple Living   0 1 1 0 0 0 2       Patient's last menstrual period was 03/04/2015.    Past Medical History  Diagnosis Date  . Rickets   . Thyroid disease hypothyroid  . Seasonal allergies   . Hypothyroidism     no medication    Past Surgical History  Procedure Laterality Date  . Cesarean section    . Tooth extraction Left   . Cesarean section N/A 01/21/2014    Procedure: REPEAT CESAREAN SECTION;  Surgeon: Brock Bad, MD;  Location: [facility];  Service: Obstetrics;  Laterality: N/A;     (Not in a hospital admission) Allergies  Allergen Reactions  . Other     Cats and dogs     Social History  Substance Use Topics  . Smoking status: Never Smoker   . Smokeless tobacco: Never Used  . Alcohol Use: No    Family History  Problem Relation Age of Onset  . Cancer Sister     breast  . Diabetes Maternal Grandmother      Review of Systems Constitutional: negative for weight loss Gastrointestinal: negative for vomiting Genitourinary:negative for genital lesions and vaginal discharge and dysuria Musculoskeletal:negative for back pain Behavioral/Psych: negative for abusive relationship, depression, illegal drug usage and tobacco use    Objective:    BP 108/70 mmHg  Pulse 92  Temp(Src) 98.4 F (36.9 C)  Wt 206 lb (93.441 kg)  LMP 03/04/2015 General Appearance:    Alert, cooperative, no distress, appears stated age  Head:    Normocephalic, without obvious abnormality, atraumatic  Eyes:    PERRL,  conjunctiva/corneas clear, EOM's intact, fundi    benign, both eyes  Ears:    Normal TM's and external ear canals, both ears  Nose:   Nares normal, septum midline, mucosa normal, no drainage    or sinus tenderness  Throat:   Lips, mucosa, and tongue normal; teeth and gums normal  Neck:   Supple, symmetrical, trachea midline, no adenopathy;    thyroid:  no enlargement/tenderness/nodules; no carotid   bruit or JVD  Back:     Symmetric, no curvature, ROM normal, no CVA tenderness  Lungs:     Clear to auscultation bilaterally, respirations unlabored  Chest Wall:    No tenderness or deformity   Heart:    Regular rate and rhythm, S1 and S2 normal, no murmur, rub   or gallop  Breast Exam:    No tenderness, masses, or nipple abnormality  Abdomen:     Soft, non-tender, bowel sounds active all four quadrants,    no masses, no organomegaly  Genitalia:    Normal female without lesion, discharge or tenderness  Extremities:   Extremities normal, atraumatic, no cyanosis or edema  Pulses:   2+ and symmetric all extremities  Skin:   Skin color, texture, turgor normal, no rashes or lesions  Lymph nodes:   Cervical, supraclavicular, and axillary nodes normal  Neurologic:   CNII-XII intact, normal strength, sensation and reflexes  throughout      Lab Review Urine pregnancy test Labs reviewed yes Radiologic studies reviewed yes Assessment:    Pregnancy at 4561w4d weeks   Twins   Plan:   Refer to MFM    Prenatal vitamins.  Counseling provided regarding continued use of seat belts, cessation of alcohol consumption, smoking or use of illicit drugs; infection precautions i.e., influenza/TDAP immunizations, toxoplasmosis,CMV, parvovirus, listeria and varicella; workplace safety, exercise during pregnancy; routine dental care, safe medications, sexual activity, hot tubs, saunas, pools, travel, caffeine use, fish and methlymercury, potential toxins, hair treatments, varicose veins Weight gain  recommendations per IOM guidelines reviewed: underweight/BMI< 18.5--> gain 28 - 40 lbs; normal weight/BMI 18.5 - 24.9--> gain 25 - 35 lbs; overweight/BMI 25 - 29.9--> gain 15 - 25 lbs; obese/BMI >30->gain  11 - 20 lbs Problem list reviewed and updated. FIRST/CF mutation testing/NIPT/QUAD SCREEN/fragile X/Ashkenazi Jewish population testing/Spinal muscular atrophy discussed: requested. Role of ultrasound in pregnancy discussed; fetal survey: requested. Amniocentesis discussed: not indicated. VBAC calculator score: VBAC consent form provided Meds ordered this encounter  Medications  . folic acid (FOLVITE) 1 MG tablet    Sig: Take 1 tablet (1 mg total) by mouth daily.    Dispense:  90 tablet    Refill:  3   Orders Placed This Encounter  Procedures  . SureSwab, Vaginosis/Vaginitis Plus  . Culture, OB Urine  . Obstetric panel  . HIV antibody  . Hemoglobinopathy evaluation  . Varicella zoster antibody, IgG  . Vit D  25 hydroxy (rtn osteoporosis monitoring)  . TSH  . POCT urinalysis dipstick    Follow up in 4 weeks.

## 2015-05-04 LAB — OBSTETRIC PANEL
ANTIBODY SCREEN: NEGATIVE
BASOS ABS: 0 10*3/uL (ref 0.0–0.1)
BASOS PCT: 0 % (ref 0–1)
EOS PCT: 2 % (ref 0–5)
Eosinophils Absolute: 0.1 10*3/uL (ref 0.0–0.7)
HEMATOCRIT: 33.7 % — AB (ref 36.0–46.0)
HEMOGLOBIN: 11.7 g/dL — AB (ref 12.0–15.0)
Hepatitis B Surface Ag: NEGATIVE
LYMPHS PCT: 23 % (ref 12–46)
Lymphs Abs: 1.2 10*3/uL (ref 0.7–4.0)
MCH: 31.6 pg (ref 26.0–34.0)
MCHC: 34.7 g/dL (ref 30.0–36.0)
MCV: 91.1 fL (ref 78.0–100.0)
MPV: 9.6 fL (ref 8.6–12.4)
Monocytes Absolute: 0.3 10*3/uL (ref 0.1–1.0)
Monocytes Relative: 6 % (ref 3–12)
Neutro Abs: 3.5 10*3/uL (ref 1.7–7.7)
Neutrophils Relative %: 69 % (ref 43–77)
PLATELETS: 255 10*3/uL (ref 150–400)
RBC: 3.7 MIL/uL — AB (ref 3.87–5.11)
RDW: 12.8 % (ref 11.5–15.5)
Rh Type: POSITIVE
Rubella: 1.61 Index — ABNORMAL HIGH (ref <0.90)
WBC: 5.1 10*3/uL (ref 4.0–10.5)

## 2015-05-04 LAB — PAP IG W/ RFLX HPV ASCU

## 2015-05-04 LAB — VARICELLA ZOSTER ANTIBODY, IGG: VARICELLA IGG: 438.5 {index} — AB (ref <135.00)

## 2015-05-04 LAB — TSH: TSH: 5.297 u[IU]/mL — ABNORMAL HIGH (ref 0.350–4.500)

## 2015-05-04 LAB — HIV ANTIBODY (ROUTINE TESTING W REFLEX): HIV 1&2 Ab, 4th Generation: NONREACTIVE

## 2015-05-04 LAB — VITAMIN D 25 HYDROXY (VIT D DEFICIENCY, FRACTURES): VIT D 25 HYDROXY: 26 ng/mL — AB (ref 30–100)

## 2015-05-04 LAB — CULTURE, OB URINE
Colony Count: NO GROWTH
Organism ID, Bacteria: NO GROWTH

## 2015-05-05 LAB — HEMOGLOBINOPATHY EVALUATION
HEMOGLOBIN OTHER: 0 %
HGB F QUANT: 0.3 % (ref 0.0–2.0)
HGB S QUANTITAION: 0 %
Hgb A2 Quant: 2.8 % (ref 2.2–3.2)
Hgb A: 96.9 % (ref 96.8–97.8)

## 2015-05-07 LAB — SURESWAB, VAGINOSIS/VAGINITIS PLUS
Atopobium vaginae: NOT DETECTED Log (cells/mL)
C. GLABRATA, DNA: NOT DETECTED
C. TRACHOMATIS RNA, TMA: NOT DETECTED
C. TROPICALIS, DNA: NOT DETECTED
C. albicans, DNA: NOT DETECTED
C. parapsilosis, DNA: NOT DETECTED
GARDNERELLA VAGINALIS: 5.4 Log (cells/mL)
LACTOBACILLUS SPECIES: 7 Log (cells/mL)
MEGASPHAERA SPECIES: NOT DETECTED Log (cells/mL)
N. gonorrhoeae RNA, TMA: NOT DETECTED
T. VAGINALIS RNA, QL TMA: NOT DETECTED

## 2015-05-12 ENCOUNTER — Telehealth: Payer: Self-pay | Admitting: *Deleted

## 2015-05-12 NOTE — Telephone Encounter (Signed)
Patient c/o nausea and headache. 4:04 Patient is using the Diclegsis- which is helping- but she is having headaches. Talked to patient- advised small meals often-grazing, increased fluids, Tylenol for headache- call if no help.

## 2015-05-23 ENCOUNTER — Encounter: Payer: Medicaid Other | Admitting: Obstetrics

## 2015-05-30 ENCOUNTER — Ambulatory Visit (INDEPENDENT_AMBULATORY_CARE_PROVIDER_SITE_OTHER): Payer: Medicaid Other | Admitting: Obstetrics

## 2015-05-30 VITALS — BP 134/70 | HR 85 | Wt 205.0 lb

## 2015-05-30 DIAGNOSIS — Z3482 Encounter for supervision of other normal pregnancy, second trimester: Secondary | ICD-10-CM

## 2015-05-30 DIAGNOSIS — IMO0001 Reserved for inherently not codable concepts without codable children: Secondary | ICD-10-CM

## 2015-05-30 LAB — POCT URINALYSIS DIPSTICK
BILIRUBIN UA: NEGATIVE
Blood, UA: 250
Glucose, UA: NEGATIVE
KETONES UA: NEGATIVE
LEUKOCYTES UA: NEGATIVE
NITRITE UA: NEGATIVE
PH UA: 5
Spec Grav, UA: 1.02
Urobilinogen, UA: 1

## 2015-05-30 NOTE — Progress Notes (Signed)
Pt states that she is having a hard time breathing. Repeat vitals as pt got SOB walking down hall in office.  O2sat 99%

## 2015-05-31 ENCOUNTER — Ambulatory Visit (HOSPITAL_COMMUNITY): Payer: Medicaid Other

## 2015-05-31 ENCOUNTER — Ambulatory Visit (HOSPITAL_COMMUNITY)
Admission: RE | Admit: 2015-05-31 | Discharge: 2015-05-31 | Disposition: A | Discharge status: Home / Self Care | Payer: Medicaid Other | Source: Ambulatory Visit | Attending: Obstetrics | Admitting: Obstetrics

## 2015-05-31 ENCOUNTER — Other Ambulatory Visit: Payer: Self-pay | Admitting: Obstetrics

## 2015-05-31 ENCOUNTER — Encounter (HOSPITAL_COMMUNITY): Payer: Medicaid Other

## 2015-05-31 DIAGNOSIS — O30009 Twin pregnancy, unspecified number of placenta and unspecified number of amniotic sacs, unspecified trimester: Secondary | ICD-10-CM

## 2015-05-31 DIAGNOSIS — O34219 Maternal care for unspecified type scar from previous cesarean delivery: Secondary | ICD-10-CM | POA: Insufficient documentation

## 2015-05-31 DIAGNOSIS — Z3689 Encounter for other specified antenatal screening: Principal | ICD-10-CM

## 2015-05-31 DIAGNOSIS — Z3A12 12 weeks gestation of pregnancy: Secondary | ICD-10-CM | POA: Insufficient documentation

## 2015-05-31 DIAGNOSIS — IMO0001 Reserved for inherently not codable concepts without codable children: Secondary | ICD-10-CM

## 2015-05-31 DIAGNOSIS — Z36 Encounter for antenatal screening of mother: Secondary | ICD-10-CM | POA: Insufficient documentation

## 2015-06-01 ENCOUNTER — Ambulatory Visit (HOSPITAL_COMMUNITY): Payer: Medicaid Other

## 2015-06-03 ENCOUNTER — Encounter: Payer: Self-pay | Admitting: Obstetrics

## 2015-06-03 NOTE — Progress Notes (Signed)
  Subjective:    Julia Fowler is a 29 y.o. female being seen today for her obstetrical visit. She is at 6539w0d gestation. Patient reports: no complaints.  Problem List Items Addressed This Visit    None    Visit Diagnoses    Encounter for supervision of other normal pregnancy in second trimester    -  Primary    Relevant Orders    POCT urinalysis dipstick (Completed)    Twins        Relevant Orders    AMB Referral to Maternal Fetal Medicine (MFM)      Patient Active Problem List   Diagnosis Date Noted  . Cesarean delivery, without mention of indication, delivered, with or without mention of antepartum condition 01/21/2014  . Excessive fetal growth 12/28/2013  . History of C-section 07/14/2013  . Hypothyroid 04/10/2013  . BMI 37.0-37.9, adult 04/10/2013  . Rickets 04/10/2013  . Family history of breast cancer in first degree relative 04/10/2013    Objective:     BP 134/70 mmHg  Pulse 85  Wt 205 lb (92.987 kg)  LMP 03/04/2015 Uterine Size: Below umbilicus     Assessment:    Pregnancy @ 7139w0d  weeks Doing well    Plan:    Problem list reviewed and updated. Labs reviewed.  Follow up in 2 weeks. FIRST/CF mutation testing/NIPT/QUAD SCREEN/fragile X/Ashkenazi Jewish population testing/Spinal muscular atrophy discussed: requested. Role of ultrasound in pregnancy discussed; fetal survey: requested. Amniocentesis discussed: not indicated.

## 2015-06-07 ENCOUNTER — Encounter (HOSPITAL_COMMUNITY): Payer: Self-pay

## 2015-06-07 ENCOUNTER — Ambulatory Visit (HOSPITAL_COMMUNITY)
Admission: RE | Admit: 2015-06-07 | Discharge: 2015-06-07 | Disposition: A | Discharge status: Home / Self Care | Payer: Medicaid Other | Source: Ambulatory Visit | Attending: Obstetrics | Admitting: Obstetrics

## 2015-06-07 VITALS — BP 130/85 | HR 100 | Wt 207.2 lb

## 2015-06-07 DIAGNOSIS — Z3A13 13 weeks gestation of pregnancy: Secondary | ICD-10-CM | POA: Insufficient documentation

## 2015-06-07 DIAGNOSIS — E550 Rickets, active: Secondary | ICD-10-CM | POA: Diagnosis not present

## 2015-06-07 DIAGNOSIS — E559 Vitamin D deficiency, unspecified: Secondary | ICD-10-CM | POA: Diagnosis not present

## 2015-06-07 DIAGNOSIS — Z3689 Encounter for other specified antenatal screening: Secondary | ICD-10-CM

## 2015-06-07 DIAGNOSIS — O34219 Maternal care for unspecified type scar from previous cesarean delivery: Secondary | ICD-10-CM | POA: Insufficient documentation

## 2015-06-07 DIAGNOSIS — O30049 Twin pregnancy, dichorionic/diamniotic, unspecified trimester: Secondary | ICD-10-CM

## 2015-06-07 DIAGNOSIS — O30041 Twin pregnancy, dichorionic/diamniotic, first trimester: Secondary | ICD-10-CM | POA: Insufficient documentation

## 2015-06-07 NOTE — Progress Notes (Signed)
MATERNAL FETAL MEDICINE CONSULT  Patient Name: Julia Fowler Medical Record Number:  [id number] Date of Birth: August 09, 1985 Requesting Physician Name:  Brock Badharles A Harper, MD Date of Service: 06/07/2015  Chief Complaint Dichorionic diamniotic twin pregnancy  History of Present Illness Julia Fowler was seen today secondary to dichorionic diamniotic twin pregnancy at the request of Brock Badharles A Harper, MD.  The patient is a 29 y.o. [id number],at 773w4d with an EDD of 12/09/2015, by Last Menstrual Period dating method.  Julia Fowler's pregnancy has thus far been uncomplicated other than having twins.  She has rickets and vitamin D deficiency as does her son and her father.  She has not had a genetic evaluation to determine the etiology of what appears to be a familial case of rickets.    Review of Systems Pertinent items are noted in HPI.  Patient History OB History  Gravida Para Term Preterm AB SAB TAB Ectopic Multiple Living  4 2 2  0 1 0 1 0 0 2    # Outcome Date GA Lbr Len/2nd Weight Sex Delivery Anes PTL Lv  4 Current           3 Term 01/21/14 3339w0d  7 lb 8.1 oz (3.405 kg) F CS-LTranv Spinal  Y  2 TAB 06/27/09 7150w0d     Other  N     Comments: abortion  1 Term 10/20/00 9558w0d  7 lb 7 oz (3.374 kg) M CS-Unspec EPI N Y     Comments: Did not progress past 3 cm      Past Medical History  Diagnosis Date  . Rickets   . Thyroid disease hypothyroid  . Seasonal allergies   . Hypothyroidism     no medication    Past Surgical History  Procedure Laterality Date  . Cesarean section    . Tooth extraction Left   . Cesarean section N/A 01/21/2014    Procedure: REPEAT CESAREAN SECTION;  Surgeon: Brock Badharles A Harper, MD;  Location: [facility];  Service: Obstetrics;  Laterality: N/A;    Social History   Social History  . Marital Status: Single    Spouse Name: N/A  . Number of Children: N/A  . Years of Education: N/A   Social History Main Topics  . Smoking status: Never Smoker   . Smokeless tobacco:  Never Used  . Alcohol Use: No  . Drug Use: No  . Sexual Activity:    Partners: Male    Birth Control/ Protection:    Other Topics Concern  . None   Social History Narrative    Family History  Problem Relation Age of Onset  . Cancer Sister     breast  . Diabetes Maternal Grandmother    In addition, the patient has no family history of mental retardation, birth defects, or genetic diseases other than rickets.  Physical Examination Filed Vitals:   06/07/15 1320  BP: 130/85  Pulse: 100   General appearance - alert, well appearing, and in no distress Extremities - Marked bowing of the lower extremities is present bilaterally.  No pedal edema noted.  Assessment and Recommendations 1.  Dichorionic diamniotic twins.  Julia Fowler has a dichorionic diamniotictwin pregnancy.  I explained the risks associated with twin pregnancies most notably preterm birth and growth restriction.  As a result she will require monthly ultrasounds to assess fetal growth.  In addition, once or twice weekly fetal testing should be begun if fetal growth restriction develops in either twin.  An elective repeat cesarean should  not be undertaken before 38 weeks of gestation unless otherwise indicated prior to that time. 2.  Familial rickets.  Julia Fowler has several close family members with rickets, but she has not had a formal genetics evaluation for the potential genetic defect causing the condition.  She would like to pursue a genetic workup at this time.  I offered to have Ms. Zachery Dauer meet with our genetic counselor today, but she as unable to stay for it today.  It will be scheduled in the near future.   I spent 30 minutes with Julia Fowler today of which 50% was face-to-face counseling.  Thank you for referring Julia Fowler to the [facility].  Please do not hesitate to contact us with questions.   Rema Fendt, MD

## 2015-06-10 ENCOUNTER — Other Ambulatory Visit: Payer: Self-pay | Admitting: Certified Nurse Midwife

## 2015-06-16 ENCOUNTER — Other Ambulatory Visit (HOSPITAL_COMMUNITY): Payer: Self-pay

## 2015-06-20 ENCOUNTER — Ambulatory Visit (INDEPENDENT_AMBULATORY_CARE_PROVIDER_SITE_OTHER): Payer: Medicaid Other | Admitting: Obstetrics

## 2015-06-20 VITALS — BP 116/61 | HR 99 | Temp 98.1°F | Wt 208.0 lb

## 2015-06-20 DIAGNOSIS — M549 Dorsalgia, unspecified: Secondary | ICD-10-CM

## 2015-06-20 DIAGNOSIS — O30002 Twin pregnancy, unspecified number of placenta and unspecified number of amniotic sacs, second trimester: Secondary | ICD-10-CM

## 2015-06-20 LAB — POCT URINALYSIS DIPSTICK
Bilirubin, UA: NEGATIVE
Glucose, UA: NEGATIVE
Ketones, UA: NEGATIVE
Leukocytes, UA: NEGATIVE
NITRITE UA: NEGATIVE
PH UA: 6
PROTEIN UA: NEGATIVE
Spec Grav, UA: 1.015
UROBILINOGEN UA: NEGATIVE

## 2015-06-20 MED ORDER — CYCLOBENZAPRINE HCL 10 MG PO TABS: 10.0000 mg | ORAL_TABLET | Freq: Three times a day (TID) | ORAL | Status: DC | PRN

## 2015-06-20 MED ORDER — TRAMADOL HCL 50 MG PO TABS: 100.0000 mg | ORAL_TABLET | Freq: Four times a day (QID) | ORAL | Status: DC | PRN

## 2015-06-20 NOTE — Progress Notes (Signed)
Patient states she is having pain in her lower back. Patient states the pain is not helped by tylenol or rest.

## 2015-06-21 ENCOUNTER — Encounter: Payer: Self-pay | Admitting: Obstetrics

## 2015-06-21 NOTE — Progress Notes (Signed)
  Subjective:    Julia Fowler is a 29 y.o. female being seen today for her obstetrical visit. She is at 8373w4d gestation. Patient reports: Backache.  Problem List Items Addressed This Visit    None    Visit Diagnoses    Twin pregnancy in second trimester    -  Primary    Relevant Orders    POCT urinalysis dipstick (Completed)    Backache without radiation        Relevant Medications    traMADol (ULTRAM) 50 MG tablet    cyclobenzaprine (FLEXERIL) 10 MG tablet      Patient Active Problem List   Diagnosis Date Noted  . Cesarean delivery, without mention of indication, delivered, with or without mention of antepartum condition 01/21/2014  . Excessive fetal growth 12/28/2013  . History of C-section 07/14/2013  . Hypothyroid 04/10/2013  . BMI 37.0-37.9, adult 04/10/2013  . Rickets 04/10/2013  . Family history of breast cancer in first degree relative 04/10/2013    Objective:     BP 116/61 mmHg  Pulse 99  Temp(Src) 98.1 F (36.7 C)  Wt 208 lb (94.348 kg)  LMP 03/04/2015 Uterine Size: Below umbilicus     Assessment:    Pregnancy @ 1573w4d  weeks Doing well    Backache  Plan:   Flexeril / Tramadol Rx Maternity belt Rx   Problem list reviewed and updated. Labs reviewed.  Follow up in 4 weeks. FIRST/CF mutation testing/NIPT/QUAD SCREEN/fragile X/Ashkenazi Jewish population testing/Spinal muscular atrophy discussed: requested. Role of ultrasound in pregnancy discussed; fetal survey: requested. Amniocentesis discussed: not indicated.

## 2015-06-22 ENCOUNTER — Ambulatory Visit (HOSPITAL_COMMUNITY): Payer: Medicaid Other

## 2015-07-12 ENCOUNTER — Other Ambulatory Visit (HOSPITAL_COMMUNITY): Payer: Self-pay | Admitting: Maternal and Fetal Medicine

## 2015-07-12 ENCOUNTER — Encounter (HOSPITAL_COMMUNITY): Payer: Self-pay

## 2015-07-12 ENCOUNTER — Ambulatory Visit (HOSPITAL_COMMUNITY)
Admission: RE | Admit: 2015-07-12 | Discharge: 2015-07-12 | Disposition: A | Discharge status: Home / Self Care | Payer: Medicaid Other | Source: Ambulatory Visit | Attending: Obstetrics | Admitting: Obstetrics

## 2015-07-12 DIAGNOSIS — O34219 Maternal care for unspecified type scar from previous cesarean delivery: Secondary | ICD-10-CM | POA: Diagnosis not present

## 2015-07-12 DIAGNOSIS — Z3689 Encounter for other specified antenatal screening: Secondary | ICD-10-CM

## 2015-07-12 DIAGNOSIS — O30049 Twin pregnancy, dichorionic/diamniotic, unspecified trimester: Secondary | ICD-10-CM

## 2015-07-12 DIAGNOSIS — Z3A18 18 weeks gestation of pregnancy: Secondary | ICD-10-CM | POA: Diagnosis not present

## 2015-07-12 DIAGNOSIS — O99212 Obesity complicating pregnancy, second trimester: Secondary | ICD-10-CM | POA: Diagnosis not present

## 2015-07-12 DIAGNOSIS — O30042 Twin pregnancy, dichorionic/diamniotic, second trimester: Secondary | ICD-10-CM | POA: Insufficient documentation

## 2015-07-12 DIAGNOSIS — Z36 Encounter for antenatal screening of mother: Secondary | ICD-10-CM | POA: Diagnosis not present

## 2015-07-12 DIAGNOSIS — O99213 Obesity complicating pregnancy, third trimester: Secondary | ICD-10-CM

## 2015-07-18 ENCOUNTER — Encounter: Payer: Medicaid Other | Admitting: Obstetrics

## 2015-07-18 ENCOUNTER — Telehealth: Payer: Self-pay | Admitting: Obstetrics

## 2015-08-02 ENCOUNTER — Encounter: Payer: Self-pay | Admitting: Obstetrics

## 2015-08-02 NOTE — Telephone Encounter (Signed)
STILL UNABLE TO REACH PATIENT AFTER MULTIPLE ATTEMPTS

## 2015-08-08 ENCOUNTER — Ambulatory Visit (INDEPENDENT_AMBULATORY_CARE_PROVIDER_SITE_OTHER): Payer: Medicaid Other | Admitting: Obstetrics

## 2015-08-08 VITALS — BP 127/82 | HR 90 | Temp 98.4°F | Wt 214.0 lb

## 2015-08-08 DIAGNOSIS — O30042 Twin pregnancy, dichorionic/diamniotic, second trimester: Secondary | ICD-10-CM

## 2015-08-08 LAB — POCT URINALYSIS DIPSTICK
Bilirubin, UA: NEGATIVE
Blood, UA: 50
Glucose, UA: NEGATIVE
KETONES UA: NEGATIVE
Leukocytes, UA: NEGATIVE
Nitrite, UA: NEGATIVE
PH UA: 5
SPEC GRAV UA: 1.02
UROBILINOGEN UA: NEGATIVE

## 2015-08-09 ENCOUNTER — Encounter: Payer: Self-pay | Admitting: Obstetrics

## 2015-08-09 NOTE — Progress Notes (Signed)
Subjective:    Julia Fowler is a 30 y.o. female being seen today for her obstetrical visit. She is at [redacted]w[redacted]d gestation. Patient reports: no complaints . Fetal movement: normal.  Problem List Items Addressed This Visit    None    Visit Diagnoses    Dichorionic diamniotic twin pregnancy in second trimester    -  Primary    Relevant Orders    POCT urinalysis dipstick (Completed)      Patient Active Problem List   Diagnosis Date Noted  . Cesarean delivery, without mention of indication, delivered, with or without mention of antepartum condition 01/21/2014  . Excessive fetal growth 12/28/2013  . History of C-section 07/14/2013  . Hypothyroid 04/10/2013  . BMI 37.0-37.9, adult 04/10/2013  . Rickets 04/10/2013  . Family history of breast cancer in first degree relative 04/10/2013   Objective:    BP 127/82 mmHg  Pulse 90  Temp(Src) 98.4 F (36.9 C)  Wt 214 lb (97.07 kg)  LMP 03/04/2015 FHT: 150 BPM  Uterine Size: size equals dates     Assessment:    Pregnancy @ [redacted]w[redacted]d    Plan:    Signs and symptoms of preterm labor: discussed.  Labs, problem list reviewed and updated 2 hr GTT planned Follow up in 4 weeks.

## 2015-08-16 ENCOUNTER — Encounter: Payer: Self-pay | Admitting: *Deleted

## 2015-08-16 ENCOUNTER — Institutional Professional Consult (permissible substitution): Payer: Medicaid Other

## 2015-08-17 ENCOUNTER — Encounter: Payer: Medicaid Other | Admitting: Obstetrics

## 2015-08-21 ENCOUNTER — Inpatient Hospital Stay (HOSPITAL_COMMUNITY)
Admission: AD | Admit: 2015-08-21 | Discharge: 2015-08-21 | Disposition: A | Discharge status: Home / Self Care | Payer: Medicaid Other | Source: Ambulatory Visit | Attending: Obstetrics | Admitting: Obstetrics

## 2015-08-21 ENCOUNTER — Encounter (HOSPITAL_COMMUNITY): Payer: Self-pay

## 2015-08-21 DIAGNOSIS — J069 Acute upper respiratory infection, unspecified: Secondary | ICD-10-CM

## 2015-08-21 DIAGNOSIS — J029 Acute pharyngitis, unspecified: Secondary | ICD-10-CM | POA: Diagnosis present

## 2015-08-21 DIAGNOSIS — E039 Hypothyroidism, unspecified: Secondary | ICD-10-CM | POA: Insufficient documentation

## 2015-08-21 DIAGNOSIS — O26892 Other specified pregnancy related conditions, second trimester: Secondary | ICD-10-CM | POA: Diagnosis not present

## 2015-08-21 DIAGNOSIS — O30042 Twin pregnancy, dichorionic/diamniotic, second trimester: Secondary | ICD-10-CM | POA: Diagnosis not present

## 2015-08-21 DIAGNOSIS — K219 Gastro-esophageal reflux disease without esophagitis: Secondary | ICD-10-CM

## 2015-08-21 DIAGNOSIS — O9989 Other specified diseases and conditions complicating pregnancy, childbirth and the puerperium: Secondary | ICD-10-CM | POA: Diagnosis not present

## 2015-08-21 DIAGNOSIS — R109 Unspecified abdominal pain: Secondary | ICD-10-CM | POA: Insufficient documentation

## 2015-08-21 DIAGNOSIS — R05 Cough: Secondary | ICD-10-CM | POA: Diagnosis present

## 2015-08-21 DIAGNOSIS — Z3A24 24 weeks gestation of pregnancy: Secondary | ICD-10-CM | POA: Insufficient documentation

## 2015-08-21 DIAGNOSIS — K529 Noninfective gastroenteritis and colitis, unspecified: Secondary | ICD-10-CM

## 2015-08-21 LAB — URINALYSIS, ROUTINE W REFLEX MICROSCOPIC
Bilirubin Urine: NEGATIVE
GLUCOSE, UA: NEGATIVE mg/dL
Ketones, ur: 15 mg/dL — AB
LEUKOCYTES UA: NEGATIVE
Nitrite: NEGATIVE
PH: 6 (ref 5.0–8.0)
PROTEIN: 30 mg/dL — AB
Specific Gravity, Urine: 1.02 (ref 1.005–1.030)

## 2015-08-21 LAB — CBC
HEMATOCRIT: 31.8 % — AB (ref 36.0–46.0)
HEMOGLOBIN: 10.8 g/dL — AB (ref 12.0–15.0)
MCH: 30.8 pg (ref 26.0–34.0)
MCHC: 34 g/dL (ref 30.0–36.0)
MCV: 90.6 fL (ref 78.0–100.0)
Platelets: 208 10*3/uL (ref 150–400)
RBC: 3.51 MIL/uL — ABNORMAL LOW (ref 3.87–5.11)
RDW: 13.6 % (ref 11.5–15.5)
WBC: 5.4 10*3/uL (ref 4.0–10.5)

## 2015-08-21 LAB — URINE MICROSCOPIC-ADD ON

## 2015-08-21 LAB — RAPID STREP SCREEN (MED CTR MEBANE ONLY): STREPTOCOCCUS, GROUP A SCREEN (DIRECT): NEGATIVE

## 2015-08-21 MED ORDER — DM-GUAIFENESIN ER 30-600 MG PO TB12
1.0000 | ORAL_TABLET | Freq: Two times a day (BID) | ORAL | Status: DC
Start: 2015-08-21 — End: 2015-08-22
  Administered 2015-08-21: 1 via ORAL
  Filled 2015-08-21 (×2): qty 1

## 2015-08-21 MED ORDER — FAMOTIDINE 20 MG PO TABS: 20.0000 mg | ORAL_TABLET | Freq: Two times a day (BID) | ORAL | Status: DC

## 2015-08-21 MED ORDER — GI COCKTAIL ~~LOC~~
30.0000 mL | Freq: Once | ORAL | Status: AC
  Administered 2015-08-21: 30 mL via ORAL
  Filled 2015-08-21: qty 30

## 2015-08-21 MED ORDER — LACTATED RINGERS IV BOLUS (SEPSIS)
1000.0000 mL | Freq: Once | INTRAVENOUS | Status: AC
  Administered 2015-08-21: 1000 mL via INTRAVENOUS

## 2015-08-21 MED ORDER — GUAIFENESIN ER 600 MG PO TB12: 600.0000 mg | ORAL_TABLET | Freq: Two times a day (BID) | ORAL | Status: DC

## 2015-08-21 MED ORDER — ONDANSETRON 8 MG PO TBDP
8.0000 mg | ORAL_TABLET | Freq: Once | ORAL | Status: AC
  Administered 2015-08-21: 8 mg via ORAL
  Filled 2015-08-21: qty 1

## 2015-08-21 NOTE — MAU Provider Note (Signed)
History     CSN: [id number]  Arrival date and time: 08/21/15 1848   None     No chief complaint on file.  HPI  Pt is [redacted]w[redacted]d pregnant with DiDi twins [id number].  Pt presents with sore throat, cough, vomiting, diarrhea and headache. Sx started last night.  Pt has not taken anything for her sx. Pt last had diarrhea at 2 pm.  Pt ate a piece of bread and drank some ginger ale this afternoon.   Pt denies any abd pain, spotting, bleeding or LOF. Pt states no other family members have been sick. Pt has not taken her temperature.  Past Medical History  Diagnosis Date  . Rickets   . Thyroid disease hypothyroid  . Seasonal allergies   . Hypothyroidism     no medication    Past Surgical History  Procedure Laterality Date  . Cesarean section    . Tooth extraction Left   . Cesarean section N/A 01/21/2014    Procedure: REPEAT CESAREAN SECTION;  Surgeon: Brock Bad, MD;  Location: [facility];  Service: Obstetrics;  Laterality: N/A;    Family History  Problem Relation Age of Onset  . Cancer Sister     breast  . Diabetes Maternal Grandmother     Social History  Substance Use Topics  . Smoking status: Never Smoker   . Smokeless tobacco: Never Used  . Alcohol Use: No    Allergies:  Allergies  Allergen Reactions  . Other     Cats and dogs     Prescriptions prior to admission  Medication Sig Dispense Refill Last Dose  . cyclobenzaprine (FLEXERIL) 10 MG tablet Take 1 tablet (10 mg total) by mouth every 8 (eight) hours as needed for muscle spasms. (Patient not taking: Reported on 08/08/2015) 30 tablet 1 Not Taking  . folic acid (FOLVITE) 1 MG tablet Take 1 tablet (1 mg total) by mouth daily. (Patient not taking: Reported on 06/20/2015) 90 tablet 3 Not Taking  . Prenat-FeCbn-FeAspGl-FA-Omega (OB COMPLETE PETITE) 35-5-1-200 MG CAPS Take 1 tablet by mouth daily. 30 capsule 11 Taking  . traMADol (ULTRAM) 50 MG tablet Take 2 tablets (100 mg total) by mouth every 6 (six) hours as  needed. (Patient not taking: Reported on 08/08/2015) 40 tablet 2 Not Taking    ROS Physical Exam   Last menstrual period 03/04/2015, currently breastfeeding.  Physical Exam  Nursing note and vitals reviewed. Constitutional: She is oriented to person, place, and time. She appears well-developed and well-nourished.  HENT:  Head: Normocephalic.  Eyes: Pupils are equal, round, and reactive to light.  Neck: Normal range of motion. Neck supple.  Cardiovascular: Normal rate.   Respiratory: Effort normal and breath sounds normal. No respiratory distress. She has no wheezes. She has no rales.  GI: Soft. She exhibits no distension.  Gravid abdomen AGA; mildly tender upper abdomen- burning sensation   Musculoskeletal: Normal range of motion.  Neurological: She is alert and oriented to person, place, and time.  Skin: Skin is warm and dry.  Psychiatric: She has a normal mood and affect.    MAU Course  Procedures Zofran 8 mg ODT- relief of nausea IVF LR 1 liter GI cocktail Mucinex DM Rapid strept and flu swab pending- pt to call Dr. Verdell Carmine office tomorrow for results Urine culture pending Reactive tracing of twin A and Twin B Assessment and Plan  URI in pregnancy- mucinex DM and tylenol, otc Upper abd pain- GI cocktail; pepcid OTC Di/Di viable twins [redacted]w[redacted]d- keep appt  with Dr. Clearance Coots Call Dr. Verdell Carmine office tomorrow for results of cultures  [facility] 08/21/2015, 7:06 PM

## 2015-08-22 LAB — URINE CULTURE: Special Requests: NORMAL

## 2015-08-22 LAB — INFLUENZA PANEL BY PCR (TYPE A & B)
H1N1 flu by pcr: NOT DETECTED
Influenza A By PCR: NEGATIVE
Influenza B By PCR: NEGATIVE

## 2015-08-23 ENCOUNTER — Encounter (HOSPITAL_COMMUNITY): Payer: Medicaid Other

## 2015-08-23 ENCOUNTER — Ambulatory Visit (HOSPITAL_COMMUNITY): Payer: Medicaid Other

## 2015-08-24 LAB — CULTURE, GROUP A STREP (THRC)

## 2015-08-30 ENCOUNTER — Ambulatory Visit (INDEPENDENT_AMBULATORY_CARE_PROVIDER_SITE_OTHER): Payer: Medicaid Other | Admitting: Obstetrics

## 2015-08-30 VITALS — BP 121/78 | HR 88 | Temp 97.8°F | Wt 213.0 lb

## 2015-08-30 DIAGNOSIS — O30042 Twin pregnancy, dichorionic/diamniotic, second trimester: Secondary | ICD-10-CM

## 2015-08-30 LAB — POCT URINALYSIS DIPSTICK
Bilirubin, UA: NEGATIVE
Glucose, UA: NEGATIVE
Ketones, UA: NEGATIVE
LEUKOCYTES UA: NEGATIVE
NITRITE UA: NEGATIVE
PH UA: 6
Spec Grav, UA: 1.015
UROBILINOGEN UA: NEGATIVE

## 2015-08-31 ENCOUNTER — Encounter: Payer: Self-pay | Admitting: Obstetrics

## 2015-08-31 NOTE — Progress Notes (Signed)
Subjective:    Julia Fowler is being seen today for her obstetrical visit.  She is at [redacted]w[redacted]d gestation.  Patient reports no complaints.   Fetal Movement: normal.   Problem List Items Addressed This Visit    None    Visit Diagnoses    Dichorionic diamniotic twin pregnancy in second trimester    -  Primary    Relevant Orders    POCT urinalysis dipstick (Completed)      Patient Active Problem List   Diagnosis Date Noted  . Cesarean delivery, without mention of indication, delivered, with or without mention of antepartum condition 01/21/2014  . Excessive fetal growth 12/28/2013  . History of C-section 07/14/2013  . Hypothyroid 04/10/2013  . BMI 37.0-37.9, adult 04/10/2013  . Rickets 04/10/2013  . Family history of breast cancer in first degree relative 04/10/2013   Objective:    BP 121/78 mmHg  Pulse 88  Temp(Src) 97.8 F (36.6 C)  Wt 213 lb (96.616 kg)  LMP 03/04/2015 FHT:  Baby A: 140 BPM;  Baby B:  150 BPM  Uterine Size: size equals dates     Assessment:    Pregnancy @ [redacted]w[redacted]d weeks Twins, dichorionic, diamniotic.   Plan:    Ultrasound scheduled.    OBGCT: ordered for next visit. Signs and symptoms of preterm labor: discussed.. OBGCT: ordered 28 week packet: provided. Health care proxy: discussed.. Signs of premature labor and dilation were reviewed.   Discussed fetal positions and related modes of delivery.  Consent form for twins provided. Follow up: 2 weeks.

## 2015-09-07 ENCOUNTER — Ambulatory Visit (HOSPITAL_COMMUNITY): Admission: RE | Admit: 2015-09-07 | Payer: Medicaid Other | Source: Ambulatory Visit

## 2015-09-07 ENCOUNTER — Ambulatory Visit (HOSPITAL_COMMUNITY)
Admission: RE | Admit: 2015-09-07 | Discharge: 2015-09-07 | Disposition: A | Discharge status: Home / Self Care | Payer: Medicaid Other | Source: Ambulatory Visit | Attending: Maternal and Fetal Medicine | Admitting: Maternal and Fetal Medicine

## 2015-09-07 ENCOUNTER — Encounter (HOSPITAL_COMMUNITY): Payer: Self-pay

## 2015-09-07 DIAGNOSIS — Z3A26 26 weeks gestation of pregnancy: Secondary | ICD-10-CM | POA: Diagnosis not present

## 2015-09-07 DIAGNOSIS — O30042 Twin pregnancy, dichorionic/diamniotic, second trimester: Secondary | ICD-10-CM | POA: Insufficient documentation

## 2015-09-07 DIAGNOSIS — Z8489 Family history of other specified conditions: Secondary | ICD-10-CM | POA: Diagnosis not present

## 2015-09-07 DIAGNOSIS — O34219 Maternal care for unspecified type scar from previous cesarean delivery: Secondary | ICD-10-CM | POA: Diagnosis not present

## 2015-09-07 DIAGNOSIS — O99212 Obesity complicating pregnancy, second trimester: Secondary | ICD-10-CM | POA: Diagnosis not present

## 2015-09-07 DIAGNOSIS — O30049 Twin pregnancy, dichorionic/diamniotic, unspecified trimester: Secondary | ICD-10-CM

## 2015-09-08 ENCOUNTER — Other Ambulatory Visit (HOSPITAL_COMMUNITY): Payer: Self-pay | Admitting: Maternal and Fetal Medicine

## 2015-09-08 DIAGNOSIS — Z3A26 26 weeks gestation of pregnancy: Secondary | ICD-10-CM

## 2015-09-08 DIAGNOSIS — O99212 Obesity complicating pregnancy, second trimester: Secondary | ICD-10-CM

## 2015-09-08 DIAGNOSIS — O30049 Twin pregnancy, dichorionic/diamniotic, unspecified trimester: Secondary | ICD-10-CM

## 2015-09-13 ENCOUNTER — Ambulatory Visit (INDEPENDENT_AMBULATORY_CARE_PROVIDER_SITE_OTHER): Payer: Medicaid Other | Admitting: Obstetrics

## 2015-09-13 ENCOUNTER — Encounter: Payer: Self-pay | Admitting: Obstetrics

## 2015-09-13 VITALS — BP 114/71 | HR 111 | Temp 98.1°F | Wt 216.0 lb

## 2015-09-13 DIAGNOSIS — O30002 Twin pregnancy, unspecified number of placenta and unspecified number of amniotic sacs, second trimester: Secondary | ICD-10-CM

## 2015-09-13 NOTE — Progress Notes (Signed)
Subjective:    Julia Fowler is being seen today for her obstetrical visit.  She is at 2485w4d gestation.  Patient reports no complaints.   Fetal Movement: normal.   Problem List Items Addressed This Visit    None    Visit Diagnoses    Twin pregnancy in second trimester    -  Primary    Relevant Orders    POCT urinalysis dipstick      Patient Active Problem List   Diagnosis Date Noted  . Cesarean delivery, without mention of indication, delivered, with or without mention of antepartum condition 01/21/2014  . Excessive fetal growth 12/28/2013  . History of C-section 07/14/2013  . Hypothyroid 04/10/2013  . BMI 37.0-37.9, adult 04/10/2013  . Rickets 04/10/2013  . Family history of breast cancer in first degree relative 04/10/2013   Objective:    BP 114/71 mmHg  Pulse 111  Temp(Src) 98.1 F (36.7 C)  Wt 216 lb (97.977 kg)  LMP 03/04/2015 FHT:  Baby A: 150 BPM;  Baby B:  140 BPM  Uterine Size: size greater than dates     Assessment:    Pregnancy @ 7585w4d weeks Twins, dichorionic, diamniotic.   Plan:    Ultrasound scheduled.    OBGCT: ordered. Signs and symptoms of preterm labor: discussed.. 28 week packet: provided. Maternity leave: discussed.. Signs of premature labor and dilation were reviewed.   Discussed fetal positions and related modes of delivery.  Consent form for twins provided. Follow up: 2 weeks.

## 2015-09-27 ENCOUNTER — Encounter: Payer: Medicaid Other | Admitting: Certified Nurse Midwife

## 2015-10-03 ENCOUNTER — Other Ambulatory Visit: Payer: Self-pay | Admitting: Certified Nurse Midwife

## 2015-10-05 ENCOUNTER — Ambulatory Visit (HOSPITAL_COMMUNITY)
Admission: RE | Admit: 2015-10-05 | Discharge: 2015-10-05 | Disposition: A | Discharge status: Home / Self Care | Payer: Medicaid Other | Source: Ambulatory Visit | Attending: Certified Nurse Midwife | Admitting: Certified Nurse Midwife

## 2015-10-05 ENCOUNTER — Ambulatory Visit (HOSPITAL_COMMUNITY)
Admission: RE | Admit: 2015-10-05 | Discharge: 2015-10-05 | Disposition: A | Discharge status: Home / Self Care | Payer: Medicaid Other | Source: Ambulatory Visit | Attending: Obstetrics | Admitting: Obstetrics

## 2015-10-05 ENCOUNTER — Encounter (HOSPITAL_COMMUNITY): Payer: Self-pay

## 2015-10-05 VITALS — BP 126/71 | HR 84 | Wt 215.2 lb

## 2015-10-05 DIAGNOSIS — Z315 Encounter for genetic counseling: Secondary | ICD-10-CM | POA: Insufficient documentation

## 2015-10-05 DIAGNOSIS — O99213 Obesity complicating pregnancy, third trimester: Secondary | ICD-10-CM | POA: Diagnosis not present

## 2015-10-05 DIAGNOSIS — Z8489 Family history of other specified conditions: Secondary | ICD-10-CM | POA: Insufficient documentation

## 2015-10-05 DIAGNOSIS — O34219 Maternal care for unspecified type scar from previous cesarean delivery: Secondary | ICD-10-CM | POA: Diagnosis not present

## 2015-10-05 DIAGNOSIS — O352XX0 Maternal care for (suspected) hereditary disease in fetus, not applicable or unspecified: Secondary | ICD-10-CM | POA: Insufficient documentation

## 2015-10-05 DIAGNOSIS — O30043 Twin pregnancy, dichorionic/diamniotic, third trimester: Secondary | ICD-10-CM | POA: Diagnosis not present

## 2015-10-05 DIAGNOSIS — Z3A30 30 weeks gestation of pregnancy: Secondary | ICD-10-CM | POA: Insufficient documentation

## 2015-10-05 DIAGNOSIS — O30049 Twin pregnancy, dichorionic/diamniotic, unspecified trimester: Secondary | ICD-10-CM

## 2015-10-05 DIAGNOSIS — Z3A35 35 weeks gestation of pregnancy: Secondary | ICD-10-CM

## 2015-10-05 NOTE — Progress Notes (Signed)
Genetic Counseling  Visit Summary Note  Appointment Date: 10/05/2015 Referred By: Morene Crocker, CNM  Date of Birth: 07/23/1985  Pregnancy history: [id number] Estimated Date of Delivery: 12/09/15 Estimated Gestational Age: [redacted]w[redacted]d I met with Ms. Julia PANGALLOfor genetic counseling because of a personal history and family history of Rickets.  In summary:  Reviewed patient's family history of Rickets  Discussed inheritance patterns associated with the condition  XLD, AD, AR and XLR  Discussed 50/50 chance for each pregnancy to have Rickets, but expression can be very variable  Declined genetic testing at this time  We began by reviewing the family history in detail. Ms. BBushnellreported that she was diagnosed with Rickets in childhood.  Her father was also diagnosed with Rickets.  She is unsure of details of her father's medical health or the way in which he was diagnosed, but reports he is approximately 4 feet 5 inches tall. Ms. Julia Fowler a fraternal twin sister who also has a diagnosis of Rickets and is approximately 4 feet 5 inches tall, she has no children.  Ms. BMarascofirst child, a son, is 140years old, approximately 4 feet 11 inches tall.  He was diagnosed with Rickets when he was 131 monthsold and is followed by endocrinology and orthopedics for his diagnosis.  Her daughter is almost 231years of age and currently does not have a diagnosis of Rickets.  She understands that the phenotypic spectrum of Rickets (typically referred to as X-linked hypophosphatemia or XLH) ranges from isolated hypophosphatemia to severe lower-extremity bowing. XLH frequently manifests in the first two years of life when lower extremity bowing becomes evident with the onset of weight bearing, however it may not be diagnosed until adulthood.   The diagnosis of XLH is based on clinical findings, radiographic findings, biochemical testing and family history, rather than genetic testing.  We discussed that Rickets  can be inherited in a number of different ways.  Most commonly it occurs as an X-linked dominant condition.  She was counseled that X linked conditions are those in which the gene for the condition is found on the X chromosome.  Dominant refers to the fact that having only one copy of the abnormal gene is enough to cause symptoms of the condition.  Less commonly, Rickets has been reported to follow an X linked recessive, autosomal dominant or autosomal recessive pattern of inheritance.  Given her family history as well as the prevalence of each type of Rickets, X-linked dominant (XLD) inheritance seems the most likely.  However, autosomal dominant hypophosphatemic rickets (ADHR) can not be excluded based on this family history.  Based on either XLD or AD inheritance the recurrence chance for Julia Fowler children would be the same.  There would be a 50/50 chance with each pregnancy for her to pass on the gene with the change on it regardless of fetal gender. The severity of the condition is variable, even within a family.   Although not neccessary for a diagnosis, we did discuss genetic testing.  For XLH, there is thought to be one gene responsible, the PHEX gene.  The majority of affected males or females will have a mutation identified by either sequence analysis or deletion/duplication analysis. ADHR is associated with mutations in the FGF23 gene.  Thus testing, for Ms. BZufallwould need to be stepwise or include testing for both genes.  She was counseled that most commercial labs providing this type of genetic testing do not accept medicaid payment and  we would have to determine what the specific cost would be and how much medicaid may cover, but that she may have to pay upfront and be reimbursed by medicaid.  She stated that she was not interested in genetic testing for herself at this time, but may discuss with her son's provider's the option of testing for him.  The family histories were otherwise found to  be noncontributory for birth defects, mental retardation, and known genetic conditions. Without further information regarding the provided family history, an accurate genetic risk cannot be calculated. Further genetic counseling is warranted if more information is obtained.  Julia Fowler was provided with written information regarding sickle cell anemia (SCA) including the carrier frequency and incidence in the African-American population, the availability of carrier testing and prenatal diagnosis if indicated.  In addition, we discussed that hemoglobinopathies are routinely screened for as part of the [place] newborn screening panel.  She declined hemoglobin electrophoresis today.   Julia Fowler denied exposure to environmental toxins or chemical agents. She denied the use of alcohol, tobacco or street drugs. She denied significant viral illnesses during the course of her pregnancy. Her medical and surgical histories were contributory for Rickets as discussed above.   I counseled Julia Fowler regarding the above risks and available options.  The approximate face-to-face time with the genetic counselor was 45 minutes.  Cam Hai, MS Certified Genetic Counselor

## 2015-10-13 ENCOUNTER — Ambulatory Visit (HOSPITAL_COMMUNITY)
Admission: RE | Admit: 2015-10-13 | Discharge: 2015-10-13 | Disposition: A | Discharge status: Home / Self Care | Payer: Medicaid Other | Source: Ambulatory Visit | Attending: Certified Nurse Midwife | Admitting: Certified Nurse Midwife

## 2015-10-13 ENCOUNTER — Encounter (HOSPITAL_COMMUNITY): Payer: Self-pay

## 2015-10-13 ENCOUNTER — Other Ambulatory Visit (HOSPITAL_COMMUNITY): Payer: Self-pay | Admitting: Maternal and Fetal Medicine

## 2015-10-13 DIAGNOSIS — O30043 Twin pregnancy, dichorionic/diamniotic, third trimester: Secondary | ICD-10-CM | POA: Diagnosis not present

## 2015-10-13 DIAGNOSIS — Z8489 Family history of other specified conditions: Secondary | ICD-10-CM | POA: Insufficient documentation

## 2015-10-13 DIAGNOSIS — O30049 Twin pregnancy, dichorionic/diamniotic, unspecified trimester: Secondary | ICD-10-CM

## 2015-10-13 DIAGNOSIS — Z3A31 31 weeks gestation of pregnancy: Secondary | ICD-10-CM

## 2015-10-13 DIAGNOSIS — O34219 Maternal care for unspecified type scar from previous cesarean delivery: Secondary | ICD-10-CM

## 2015-10-13 DIAGNOSIS — O99213 Obesity complicating pregnancy, third trimester: Secondary | ICD-10-CM | POA: Insufficient documentation

## 2015-10-13 DIAGNOSIS — Z8669 Personal history of other diseases of the nervous system and sense organs: Secondary | ICD-10-CM

## 2015-10-14 ENCOUNTER — Ambulatory Visit (INDEPENDENT_AMBULATORY_CARE_PROVIDER_SITE_OTHER): Payer: Medicaid Other | Admitting: Obstetrics

## 2015-10-14 VITALS — BP 135/85 | HR 85 | Temp 98.8°F | Wt 216.0 lb

## 2015-10-14 DIAGNOSIS — Z3493 Encounter for supervision of normal pregnancy, unspecified, third trimester: Secondary | ICD-10-CM

## 2015-10-14 LAB — POCT URINALYSIS DIPSTICK
BILIRUBIN UA: NEGATIVE
Blood, UA: NEGATIVE
Glucose, UA: NEGATIVE
LEUKOCYTES UA: NEGATIVE
NITRITE UA: NEGATIVE
Spec Grav, UA: 1.01
Urobilinogen, UA: NEGATIVE
pH, UA: 7

## 2015-10-14 NOTE — Progress Notes (Signed)
  Patient concerned cramping & pressure in pelvic area & back.

## 2015-10-17 ENCOUNTER — Encounter: Payer: Self-pay | Admitting: Obstetrics

## 2015-10-17 NOTE — Progress Notes (Signed)
Subjective:    [place] BingLisa R Towle is a 30 y.o. female being seen today for her obstetrical visit. She is at 4763w3d gestation. Patient reports no complaints. Fetal movement: normal.  Problem List Items Addressed This Visit    None    Visit Diagnoses    Prenatal care, third trimester    -  Primary    Relevant Orders    POCT urinalysis dipstick (Completed)      Patient Active Problem List   Diagnosis Date Noted  . [redacted] weeks gestation of pregnancy   . Hereditary disease in family possibly affecting fetus, affecting management of mother, antepartum condition or complication   . Cesarean delivery, without mention of indication, delivered, with or without mention of antepartum condition 01/21/2014  . Excessive fetal growth 12/28/2013  . History of C-section 07/14/2013  . Hypothyroid 04/10/2013  . BMI 37.0-37.9, adult 04/10/2013  . Rickets 04/10/2013  . Family history of breast cancer in first degree relative 04/10/2013   Objective:    BP 135/85 mmHg  Pulse 85  Temp(Src) 98.8 F (37.1 C)  Wt 216 lb (97.977 kg)  LMP 03/04/2015 FHT:  150 BPM  Uterine Size: size equals dates  Presentation: unsure     Assessment:    Pregnancy @ 2363w3d weeks   Plan:     labs reviewed, problem list updated Consent signed. GBS sent TDAP offered  Rhogam given for RH negative Pediatrician: discussed. Infant feeding: plans to breastfeed. Maternity leave: discussed. Cigarette smoking: never smoked. Orders Placed This Encounter  Procedures  . POCT urinalysis dipstick   No orders of the defined types were placed in this encounter.   Follow up in 2 Weeks.

## 2015-10-19 ENCOUNTER — Encounter (HOSPITAL_COMMUNITY): Payer: Self-pay

## 2015-10-19 ENCOUNTER — Other Ambulatory Visit (HOSPITAL_COMMUNITY): Payer: Self-pay | Admitting: Maternal and Fetal Medicine

## 2015-10-19 ENCOUNTER — Ambulatory Visit (HOSPITAL_COMMUNITY)
Admission: RE | Admit: 2015-10-19 | Discharge: 2015-10-19 | Disposition: A | Discharge status: Home / Self Care | Payer: Medicaid Other | Source: Ambulatory Visit | Attending: Certified Nurse Midwife | Admitting: Certified Nurse Midwife

## 2015-10-19 DIAGNOSIS — O30049 Twin pregnancy, dichorionic/diamniotic, unspecified trimester: Secondary | ICD-10-CM

## 2015-10-19 DIAGNOSIS — O99213 Obesity complicating pregnancy, third trimester: Secondary | ICD-10-CM | POA: Diagnosis not present

## 2015-10-19 DIAGNOSIS — Z8489 Family history of other specified conditions: Secondary | ICD-10-CM | POA: Insufficient documentation

## 2015-10-19 DIAGNOSIS — Z3A32 32 weeks gestation of pregnancy: Secondary | ICD-10-CM | POA: Insufficient documentation

## 2015-10-19 DIAGNOSIS — O30043 Twin pregnancy, dichorionic/diamniotic, third trimester: Secondary | ICD-10-CM | POA: Insufficient documentation

## 2015-10-19 DIAGNOSIS — O34219 Maternal care for unspecified type scar from previous cesarean delivery: Secondary | ICD-10-CM | POA: Diagnosis not present

## 2015-10-25 ENCOUNTER — Other Ambulatory Visit (HOSPITAL_COMMUNITY): Payer: Self-pay | Admitting: Maternal and Fetal Medicine

## 2015-10-25 ENCOUNTER — Encounter (HOSPITAL_COMMUNITY): Payer: Self-pay

## 2015-10-25 ENCOUNTER — Ambulatory Visit (HOSPITAL_COMMUNITY)
Admission: RE | Admit: 2015-10-25 | Discharge: 2015-10-25 | Disposition: A | Discharge status: Home / Self Care | Payer: Medicaid Other | Source: Ambulatory Visit | Attending: Certified Nurse Midwife | Admitting: Certified Nurse Midwife

## 2015-10-25 DIAGNOSIS — O30043 Twin pregnancy, dichorionic/diamniotic, third trimester: Secondary | ICD-10-CM | POA: Insufficient documentation

## 2015-10-25 DIAGNOSIS — Z3A33 33 weeks gestation of pregnancy: Secondary | ICD-10-CM | POA: Diagnosis not present

## 2015-10-25 DIAGNOSIS — O30049 Twin pregnancy, dichorionic/diamniotic, unspecified trimester: Secondary | ICD-10-CM

## 2015-10-25 DIAGNOSIS — O34219 Maternal care for unspecified type scar from previous cesarean delivery: Secondary | ICD-10-CM | POA: Diagnosis not present

## 2015-10-25 DIAGNOSIS — Z8489 Family history of other specified conditions: Secondary | ICD-10-CM | POA: Insufficient documentation

## 2015-10-27 ENCOUNTER — Encounter: Payer: Medicaid Other | Admitting: Obstetrics

## 2015-10-31 ENCOUNTER — Encounter: Payer: Self-pay | Admitting: Obstetrics

## 2015-10-31 ENCOUNTER — Ambulatory Visit (INDEPENDENT_AMBULATORY_CARE_PROVIDER_SITE_OTHER): Payer: Medicaid Other | Admitting: Obstetrics

## 2015-10-31 VITALS — BP 136/84 | HR 80 | Temp 98.2°F | Wt 217.0 lb

## 2015-10-31 DIAGNOSIS — Z3493 Encounter for supervision of normal pregnancy, unspecified, third trimester: Secondary | ICD-10-CM

## 2015-10-31 DIAGNOSIS — O30043 Twin pregnancy, dichorionic/diamniotic, third trimester: Secondary | ICD-10-CM

## 2015-10-31 DIAGNOSIS — K219 Gastro-esophageal reflux disease without esophagitis: Secondary | ICD-10-CM

## 2015-10-31 LAB — POCT URINALYSIS DIPSTICK
BILIRUBIN UA: NEGATIVE
Glucose, UA: NEGATIVE
KETONES UA: NEGATIVE
Leukocytes, UA: NEGATIVE
NITRITE UA: NEGATIVE
PH UA: 6
PROTEIN UA: NEGATIVE
Spec Grav, UA: 1.01
Urobilinogen, UA: NEGATIVE

## 2015-10-31 MED ORDER — OMEPRAZOLE 20 MG PO CPDR: 20.0000 mg | DELAYED_RELEASE_CAPSULE | Freq: Two times a day (BID) | ORAL | Status: DC

## 2015-10-31 NOTE — Progress Notes (Signed)
Patient concerned with the pressure she's having states it's a lot of pressure

## 2015-10-31 NOTE — Progress Notes (Signed)
Subjective:    Julia Fowler is being seen today for her obstetrical visit.  She is at 2347w3d gestation. Patient reports backache and occasional contractions.   Fetal Movement: normal.   Problem List Items Addressed This Visit    None    Visit Diagnoses    Prenatal care, third trimester    -  Primary    Relevant Orders    POCT urinalysis dipstick (Completed)    GERD without esophagitis        Relevant Medications    omeprazole (PRILOSEC) 20 MG capsule      Patient Active Problem List   Diagnosis Date Noted  . [redacted] weeks gestation of pregnancy   . Hereditary disease in family possibly affecting fetus, affecting management of mother, antepartum condition or complication   . Cesarean delivery, without mention of indication, delivered, with or without mention of antepartum condition 01/21/2014  . Excessive fetal growth 12/28/2013  . History of C-section 07/14/2013  . Hypothyroid 04/10/2013  . BMI 37.0-37.9, adult 04/10/2013  . Rickets 04/10/2013  . Family history of breast cancer in first degree relative 04/10/2013   Objective:    BP 136/84 mmHg  Pulse 80  Temp(Src) 98.2 F (36.8 C)  Wt 217 lb (98.431 kg)  LMP 03/04/2015 FHT:  Baby A: 150 BPM;  Baby B:  150 BPM  Uterine Size: size greater than dates  Presentations: Baby A: unsure; Baby A: unsure    Assessment:    Pregnancy 34 and 3/7 weeks. Twins, dichorionic, diamniotic.   Plan:    Beta Strep Culture: not doe. Postpartum supports and preparation: discussed need for postpartum assistance and contraception plans discussed.. Fetal surveillance for twins discussed.  Plan to initiate NSTs at 36 weeks. Ultrasound planned.  Fetal positions and related modes of delivery discussed. Follow-up: 1 Week.

## 2015-11-02 ENCOUNTER — Encounter (HOSPITAL_COMMUNITY): Payer: Self-pay | Admitting: *Deleted

## 2015-11-02 ENCOUNTER — Ambulatory Visit (HOSPITAL_COMMUNITY)
Admission: RE | Admit: 2015-11-02 | Discharge: 2015-11-02 | Disposition: A | Discharge status: Home / Self Care | Payer: Medicaid Other | Source: Ambulatory Visit | Attending: Certified Nurse Midwife | Admitting: Certified Nurse Midwife

## 2015-11-02 ENCOUNTER — Inpatient Hospital Stay (HOSPITAL_COMMUNITY)
Admission: AD | Admit: 2015-11-02 | Discharge: 2015-11-09 | DRG: 781 | Disposition: A | Discharge status: Home / Self Care | Payer: Medicaid Other | Source: Ambulatory Visit | Attending: Obstetrics | Admitting: Obstetrics

## 2015-11-02 ENCOUNTER — Encounter (HOSPITAL_COMMUNITY): Payer: Self-pay

## 2015-11-02 ENCOUNTER — Ambulatory Visit (HOSPITAL_COMMUNITY)
Admission: RE | Admit: 2015-11-02 | Discharge: 2015-11-02 | Disposition: A | Discharge status: Home / Self Care | Payer: Medicaid Other | Source: Ambulatory Visit | Attending: Obstetrics | Admitting: Obstetrics

## 2015-11-02 ENCOUNTER — Other Ambulatory Visit (HOSPITAL_COMMUNITY): Payer: Self-pay | Admitting: Maternal and Fetal Medicine

## 2015-11-02 DIAGNOSIS — O30043 Twin pregnancy, dichorionic/diamniotic, third trimester: Secondary | ICD-10-CM | POA: Diagnosis present

## 2015-11-02 DIAGNOSIS — E550 Rickets, active: Secondary | ICD-10-CM

## 2015-11-02 DIAGNOSIS — O352XX0 Maternal care for (suspected) hereditary disease in fetus, not applicable or unspecified: Secondary | ICD-10-CM

## 2015-11-02 DIAGNOSIS — O34219 Maternal care for unspecified type scar from previous cesarean delivery: Secondary | ICD-10-CM

## 2015-11-02 DIAGNOSIS — O99283 Endocrine, nutritional and metabolic diseases complicating pregnancy, third trimester: Secondary | ICD-10-CM | POA: Diagnosis present

## 2015-11-02 DIAGNOSIS — O403XX2 Polyhydramnios, third trimester, fetus 2: Secondary | ICD-10-CM | POA: Diagnosis present

## 2015-11-02 DIAGNOSIS — Z6841 Body Mass Index (BMI) 40.0 and over, adult: Secondary | ICD-10-CM | POA: Diagnosis not present

## 2015-11-02 DIAGNOSIS — IMO0001 Reserved for inherently not codable concepts without codable children: Secondary | ICD-10-CM

## 2015-11-02 DIAGNOSIS — Z8489 Family history of other specified conditions: Secondary | ICD-10-CM

## 2015-11-02 DIAGNOSIS — O99213 Obesity complicating pregnancy, third trimester: Secondary | ICD-10-CM | POA: Diagnosis present

## 2015-11-02 DIAGNOSIS — O30049 Twin pregnancy, dichorionic/diamniotic, unspecified trimester: Secondary | ICD-10-CM

## 2015-11-02 DIAGNOSIS — Z3A34 34 weeks gestation of pregnancy: Secondary | ICD-10-CM

## 2015-11-02 DIAGNOSIS — O3090 Multiple gestation, unspecified, unspecified trimester: Secondary | ICD-10-CM

## 2015-11-02 DIAGNOSIS — Z833 Family history of diabetes mellitus: Secondary | ICD-10-CM | POA: Diagnosis not present

## 2015-11-02 DIAGNOSIS — E039 Hypothyroidism, unspecified: Secondary | ICD-10-CM | POA: Diagnosis present

## 2015-11-02 LAB — TYPE AND SCREEN
ABO/RH(D): A POS
Antibody Screen: NEGATIVE

## 2015-11-02 LAB — CBC
HEMATOCRIT: 34.1 % — AB (ref 36.0–46.0)
Hemoglobin: 11.3 g/dL — ABNORMAL LOW (ref 12.0–15.0)
MCH: 29.6 pg (ref 26.0–34.0)
MCHC: 33.1 g/dL (ref 30.0–36.0)
MCV: 89.3 fL (ref 78.0–100.0)
Platelets: 209 10*3/uL (ref 150–400)
RBC: 3.82 MIL/uL — AB (ref 3.87–5.11)
RDW: 14.3 % (ref 11.5–15.5)
WBC: 7.9 10*3/uL (ref 4.0–10.5)

## 2015-11-02 MED ORDER — DOCUSATE SODIUM 100 MG PO CAPS
100.0000 mg | ORAL_CAPSULE | Freq: Every day | ORAL | Status: DC
  Administered 2015-11-03 – 2015-11-09 (×7): 100 mg via ORAL
  Filled 2015-11-02 (×7): qty 1

## 2015-11-02 MED ORDER — PRENATAL MULTIVITAMIN CH
1.0000 | ORAL_TABLET | Freq: Every day | ORAL | Status: DC
  Administered 2015-11-03 – 2015-11-09 (×7): 1 via ORAL
  Filled 2015-11-02 (×7): qty 1

## 2015-11-02 MED ORDER — LACTATED RINGERS IV SOLN
INTRAVENOUS | Status: DC
  Administered 2015-11-02 (×2): via INTRAVENOUS

## 2015-11-02 MED ORDER — CALCIUM CARBONATE ANTACID 500 MG PO CHEW
2.0000 | CHEWABLE_TABLET | ORAL | Status: DC | PRN
  Administered 2015-11-03 – 2015-11-08 (×5): 400 mg via ORAL
  Filled 2015-11-02 (×5): qty 2

## 2015-11-02 MED ORDER — LACTATED RINGERS IV BOLUS (SEPSIS)
1000.0000 mL | Freq: Once | INTRAVENOUS | Status: AC
  Administered 2015-11-02: 1000 mL via INTRAVENOUS

## 2015-11-02 MED ORDER — NIFEDIPINE 10 MG PO CAPS
10.0000 mg | ORAL_CAPSULE | ORAL | Status: AC
  Administered 2015-11-02 (×3): 10 mg via ORAL
  Filled 2015-11-02 (×3): qty 1

## 2015-11-02 MED ORDER — BETAMETHASONE SOD PHOS & ACET 6 (3-3) MG/ML IJ SUSP
12.0000 mg | INTRAMUSCULAR | Status: AC
  Administered 2015-11-02 – 2015-11-03 (×2): 12 mg via INTRAMUSCULAR
  Filled 2015-11-02 (×2): qty 2

## 2015-11-02 MED ORDER — ACETAMINOPHEN 325 MG PO TABS
650.0000 mg | ORAL_TABLET | ORAL | Status: DC | PRN
  Administered 2015-11-02: 650 mg via ORAL
  Filled 2015-11-02: qty 2

## 2015-11-02 MED ORDER — ZOLPIDEM TARTRATE 5 MG PO TABS
5.0000 mg | ORAL_TABLET | Freq: Every evening | ORAL | Status: DC | PRN
  Administered 2015-11-03 – 2015-11-08 (×5): 5 mg via ORAL
  Filled 2015-11-02 (×6): qty 1

## 2015-11-02 NOTE — ED Notes (Signed)
Pt to room 155 for fetal monitoring.

## 2015-11-02 NOTE — H&P (Signed)
Julia Fowler is a 30 y.o. female presenting for BPP of 2/8. Maternal Medical History:  Reason for admission: Twins.  34.5 weeks.  BPP of 2/8.  Normal AFI.  Fetal activity: Perceived fetal activity is normal.   Last perceived fetal movement was within the past hour.    Prenatal complications: no prenatal complications Prenatal Complications - Diabetes: none.    OB History    Gravida Para Term Preterm AB TAB SAB Ectopic Multiple Living   4 2 2  0 1 1 0 0 0 2     Past Medical History  Diagnosis Date  . Rickets   . Thyroid disease hypothyroid  . Seasonal allergies   . Hypothyroidism     no medication   Past Surgical History  Procedure Laterality Date  . Cesarean section    . Tooth extraction Left   . Cesarean section N/A 01/21/2014    Procedure: REPEAT CESAREAN SECTION;  Surgeon: Brock Badharles A Deronda Christian, MD;  Location: [facility];  Service: Obstetrics;  Laterality: N/A;   Family History: family history includes Cancer in her sister; Diabetes in her maternal grandmother. Social History:  reports that she has never smoked. She has never used smokeless tobacco. She reports that she does not drink alcohol or use illicit drugs.   Prenatal Transfer Tool  Maternal Diabetes: No Genetic Screening: Normal Maternal Ultrasounds/Referrals: Normal Fetal Ultrasounds or other Referrals:  Referred to Materal Fetal Medicine  Maternal Substance Abuse:  No Significant Maternal Medications:  None Significant Maternal Lab Results:  None Other Comments:  None  Review of Systems  All other systems reviewed and are negative.     Height 4\' 11"  (1.499 m), weight 219 lb (99.338 kg), last menstrual period 03/04/2015, currently breastfeeding. Maternal Exam:  Abdomen: Patient reports no abdominal tenderness.   Physical Exam  Nursing note and vitals reviewed. Constitutional: She is oriented to person, place, and time. She appears well-developed and well-nourished.  HENT:  Head: Normocephalic and  atraumatic.  Eyes: Conjunctivae are normal. Pupils are equal, round, and reactive to light.  Neck: Normal range of motion. Neck supple.  Cardiovascular: Normal rate and regular rhythm.   Respiratory: Effort normal and breath sounds normal.  GI: Soft. Bowel sounds are normal.  Musculoskeletal: Normal range of motion.  Neurological: She is alert and oriented to person, place, and time.  Skin: Skin is warm and dry.  Psychiatric: She has a normal mood and affect. Her behavior is normal. Judgment and thought content normal.    Prenatal labs: ABO, Rh: --/--/A POS (04/26 1300) Antibody: NEG (04/26 1300) Rubella: 1.61 (10/25 1202) RPR: NON REAC (10/25 1202)  HBsAg: NEGATIVE (10/25 1202)  HIV: NONREACTIVE (10/25 1202)  GBS:     Assessment/Plan: 34.5 weeks.  Twins.  BPP 2/8.  Admitted for observation.  Repeat BPP.   Julia Fowler A 11/02/2015, 2:26 PM

## 2015-11-03 ENCOUNTER — Inpatient Hospital Stay (HOSPITAL_COMMUNITY): Payer: Medicaid Other

## 2015-11-03 DIAGNOSIS — IMO0001 Reserved for inherently not codable concepts without codable children: Secondary | ICD-10-CM

## 2015-11-03 MED ORDER — NIFEDIPINE ER OSMOTIC RELEASE 30 MG PO TB24
30.0000 mg | ORAL_TABLET | Freq: Two times a day (BID) | ORAL | Status: DC
  Administered 2015-11-04 – 2015-11-09 (×11): 30 mg via ORAL
  Filled 2015-11-03 (×11): qty 1

## 2015-11-03 MED ORDER — NIFEDIPINE 10 MG PO CAPS
10.0000 mg | ORAL_CAPSULE | Freq: Four times a day (QID) | ORAL | Status: AC
  Administered 2015-11-03 – 2015-11-04 (×4): 10 mg via ORAL
  Filled 2015-11-03 (×4): qty 1

## 2015-11-03 NOTE — Progress Notes (Signed)
Patient ID: Julia Fowler, female   DOB: [birth year], 30 y.o.   MRN: [id number] Hospital Day: 2  S: Preterm labor symptoms: pelvic pressure and UC's, mild.  O: Blood pressure 125/73, pulse 72, temperature 98.5 F (36.9 C), temperature source Oral, resp. rate 16, height 4\' 11"  (1.499 m), weight 219 lb (99.338 kg), last menstrual period 03/04/2015, SpO2 100 %, currently breastfeeding.   UXL:KGMWNUUVFHT:Baseline: 150 / 140 bpm Toco: Occasional runs of mild UC's  OZD:GUYQIHKVSVE:Dilation: Fingertip Station: -3 Exam by:: J.Thornton, RN  A/P- 30 y.o. admitted with:  BPP 2/8.  Responded well to bedrest.  Repeat BPP today 6/8.  Continue bedrest.  Present on Admission:  **None**  Pregnancy Complications: Twins, abnormal BPP  Preterm labor management: bedrest advised Dating:  5888w6d PNL Needed:  none FWB:  good PTL:  stable ROD: C-section without labor

## 2015-11-04 MED ORDER — ONDANSETRON HCL 4 MG PO TABS
8.0000 mg | ORAL_TABLET | Freq: Three times a day (TID) | ORAL | Status: DC | PRN
  Administered 2015-11-04 – 2015-11-05 (×2): 8 mg via ORAL
  Filled 2015-11-04 (×3): qty 2

## 2015-11-05 LAB — TYPE AND SCREEN
ABO/RH(D): A POS
Antibody Screen: NEGATIVE

## 2015-11-05 MED ORDER — SODIUM CHLORIDE 0.9% FLUSH
3.0000 mL | Freq: Two times a day (BID) | INTRAVENOUS | Status: DC
  Administered 2015-11-05 – 2015-11-08 (×8): 3 mL via INTRAVENOUS

## 2015-11-05 NOTE — Progress Notes (Signed)
Patient ID: [name], female   DOB: [birth year], 30 y.o.   MRN: [id number] Hospital Day: 4  S: Preterm labor symptoms: low back pain, pelvic pressure and occasional UC's.  O: Blood pressure 132/63, pulse 74, temperature 98.2 F (36.8 C), temperature source Oral, resp. rate 16, height 4\' 11"  (1.499 m), weight 219 lb (99.338 kg), last menstrual period 03/04/2015, SpO2 100 %, currently breastfeeding.   NFA:OZHYQMVHFHT:Baseline: 150 / 140 bpm Toco: Occasional mild UC's QIO:NGEXBMWUSVE:Dilation: Fingertip Station: -3 Exam by:: J.Thornton, RN  A/P- 30 y.o. admitted with:  BPP of 2/8 but reactive NST and normal AFI.  Has responded well to bedrest.  Continue bedrest and NST q shift.  Repeat BPP 11-09-15 scheduled with MFM.  Present on Admission:  **None**  Pregnancy Complications: H/O abnormal BPP and UC's  Preterm labor management: bedrest advised Dating:  8451w1d PNL Needed:  none FWB:  good PTL:  stable

## 2015-11-05 NOTE — Progress Notes (Addendum)
Patient ID: [place] BingLisa R Fowler, female   DOB: [birth year], 30 y.o.   MRN: [id number] Hospital Day 3  S: Preterm labor symptoms: low back pain and pelvic pressure  O: Blood pressure 144/75, pulse 73, temperature 98.5 F (36.9 C), temperature source Oral, resp. rate 18, height 4\' 11"  (1.499 m), weight 219 lb (99.338 kg), last menstrual period 03/04/2015, SpO2 100 %, currently breastfeeding.   HQI:ONGEXBMWFHT:Baseline: 150 / 140 bpm Toco: None UXL:KGMWNUUVSVE:Dilation: Fingertip Station: -3 Exam by:: J.Thornton, RN  A/P- 30 y.o. admitted with: UC's.  Twins, at 34 weeks.  Stable on bedrest.  Continue bedrest.  Present on Admission:  **None**  Pregnancy Complications: Preterm UC's  Preterm labor management: bedrest advised Dating:  455w1d PNL Needed:  none FWB:  good PTL:  stable ROD: C-section without labor

## 2015-11-06 NOTE — Progress Notes (Signed)
Patient ID: Julia Fowler, female   DOB: [birth year], 30 y.o.   MRN: [id number] Hospital Day: 5  S:  No complaints.  O: Blood pressure 134/85, pulse 82, temperature 98.5 F (36.9 C), temperature source Oral, resp. rate 18, height 4\' 11"  (1.499 m), weight 219 lb (99.338 kg), last menstrual period 03/04/2015, SpO2 100 %, currently breastfeeding.   WGN:FAOZHYQMFHT:Baseline: 150 / 140 bpm Toco: Occasional UC's, mild VHQ:IONGEXBMSVE:Dilation: Fingertip Station: -3 Exam by:: J.Thornton, RN  A/P- 30 y.o. admitted with:  Abnormal BPP with normal AFI and Reactive NST  Present on Admission:  **None**  Pregnancy Complications: Abnormal BPP Preterm labor management: bedrest advised and pelvic rest advised Dating:  4061w2d PNL Needed:  BPP FWB:  good PTL:  stable ROD: C-section without labor

## 2015-11-07 ENCOUNTER — Encounter: Payer: Medicaid Other | Admitting: Obstetrics

## 2015-11-07 NOTE — Progress Notes (Signed)
Initial Nutrition Assessment  DOCUMENTATION CODES:   Morbid obesity  INTERVENTION:  Regular diet May order double protein portions, snacks TID and from retail  NUTRITION DIAGNOSIS:   Increased nutrient needs related to  (pregnancy and fetal growth requirements) as evidenced by  (35 week twin pregnancy).   GOAL:   Patient will meet greater than or equal to 90% of their needs   MONITOR:   Weight trends  REASON FOR ASSESSMENT:   Antenatal   ASSESSMENT:   35 3/7 weeks, twins.  pre-preg weight of 206 lbs, BMI 41.7. Reports good appetite, hungry.    Diet Order:  Diet regular Room service appropriate?: Yes; Fluid consistency:: Thin  Skin:  Reviewed, no issues  Height:   Ht Readings from Last 1 Encounters:  11/02/15 4\' 11"  (1.499 m)    Weight:   Wt Readings from Last 1 Encounters:  11/02/15 219 lb (99.338 kg)    Ideal Body Weight:    90-100 lbs  BMI:  Body mass index is 44.21 kg/(m^2).  Estimated Nutritional Needs:   Kcal:  2200-2400  Protein:  110-120 g  Fluid:  2.5 L  EDUCATION NEEDS:   No education needs identified at this time  Inez PilgrimKatherine Riyanna Crutchley M.Odis LusterEd. R.D. LDN Neonatal Nutrition Support Specialist/RD III Pager [phone]      Phone [phone]

## 2015-11-07 NOTE — Progress Notes (Signed)
Patient ID: Julia Fowler, female   DOB: May 06, 1986, 30 y.o.   MRN: [id number] Hospital Day: 6  S: No complaints.  O: Blood pressure 138/87, pulse 98, temperature 98.2 F (36.8 C), temperature source Oral, resp. rate 20, height 4\' 11"  (1.499 m), weight 219 lb (99.338 kg), last menstrual period 03/04/2015, SpO2 100 %, currently breastfeeding.   WUJ:WJXBJYNWFHT:Baseline: 150 / 140 bpm Toco: Occasional UC's GNF:AOZHYQMVSVE:Dilation: Fingertip Station: -3 Exam by:: J.Thornton, RN  A/P- 30 y.o. admitted with:  Twins.  Abnormal BPP and UC's.  Normal AFI and reactive NST.  Responded well to bedrest.  Continue bedrest.   Present on Admission:  **None**  Pregnancy Complications: preterm labor   Preterm labor management: bedrest advised Dating:  2832w3d PNL Needed:  BPP scheduled for 11-09-15. FWB:  good PTL:  stable

## 2015-11-08 ENCOUNTER — Other Ambulatory Visit (HOSPITAL_COMMUNITY): Payer: Self-pay

## 2015-11-08 ENCOUNTER — Encounter: Payer: Self-pay | Admitting: *Deleted

## 2015-11-08 DIAGNOSIS — IMO0002 Reserved for concepts with insufficient information to code with codable children: Secondary | ICD-10-CM

## 2015-11-08 LAB — TYPE AND SCREEN
ABO/RH(D): A POS
ANTIBODY SCREEN: NEGATIVE

## 2015-11-08 NOTE — Progress Notes (Signed)
Julia Fowler was in good spirits during our conversation.  Julia Fowler is eager to deliver her babies as Julia Fowler is uncomfortable and also eager to get home to her other baby, (3921 month old daughter) as well as her 30 year old son.  Julia Fowler reported that her SO is taking good care of them while Julia Fowler is here, but Julia Fowler is just ready to move forward.  Julia Fowler stated a hope that Julia Fowler will be told tomorrow at her ultrasound that Julia Fowler is ready for delivery.  I encouraged her to think of how to use this time, however long it is, in a meaningful way.  Julia Fowler stated that Julia Fowler is using it to rest and gear up for having 4 children at home.  Julia Fowler will have her sister and her cousin join her, each for a week after the babies are home.  We will continue to follow up with her, but please also page as needs arise.  Chaplain Dyanne CarrelKaty Anay Rathe, Bcc Pager, [phone] 3:56 PM    11/08/15 1500  Clinical Encounter Type  Visited With Patient  Visit Type Spiritual support  Referral From Nurse  Spiritual Encounters  Spiritual Needs Emotional  Stress Factors  Patient Stress Factors Loss of control

## 2015-11-08 NOTE — Progress Notes (Signed)
Patient ID: [place] BingLisa R Sheffer, female   DOB: 08/05/1985, 30 y.o.   MRN: [id number]  Hospital Day: 7  S: Preterm labor symptoms: no complaints.   O: Blood pressure 144/79, pulse 92, temperature 97.9 F (36.6 C), temperature source Oral, resp. rate 18, height 4\' 11"  (1.499 m), weight 219 lb (99.338 kg), last menstrual period 03/04/2015, SpO2 100 %, currently breastfeeding.   AOZ:HYQMVHQIFHT:Baseline: A:145; B: 150 bpm, Variability: Good {> 6 bpm), Accelerations: Reactive and Decelerations: Absent Toco: None ONG:EXBMWUXLSVE:Dilation: Fingertip Station: -3 Exam by:: J.Thornton, RN  A/P- 30 y.o. admitted with: Twins. Abnormal BPP and UC's. Normal AFI and reactive NST. Responded well to bedrest. Continue bedrest.   Present on Admission:  **None**  Pregnancy Complications: Non reactive NST, twin gestation  Preterm labor management: bedrest advised and pelvic rest advised Dating:  8049w4d PNL Needed:  none FWB:  good PTL:  stable Anticipate repeat C-section.

## 2015-11-09 ENCOUNTER — Inpatient Hospital Stay (HOSPITAL_COMMUNITY)
Admission: RE | Admit: 2015-11-09 | Discharge: 2015-11-09 | Disposition: A | Discharge status: Home / Self Care | Payer: Medicaid Other | Source: Ambulatory Visit | Attending: Certified Nurse Midwife | Admitting: Certified Nurse Midwife

## 2015-11-09 DIAGNOSIS — Z8489 Family history of other specified conditions: Secondary | ICD-10-CM

## 2015-11-09 DIAGNOSIS — Z3A35 35 weeks gestation of pregnancy: Secondary | ICD-10-CM

## 2015-11-09 DIAGNOSIS — O34219 Maternal care for unspecified type scar from previous cesarean delivery: Secondary | ICD-10-CM

## 2015-11-09 DIAGNOSIS — O30049 Twin pregnancy, dichorionic/diamniotic, unspecified trimester: Secondary | ICD-10-CM

## 2015-11-09 DIAGNOSIS — O30043 Twin pregnancy, dichorionic/diamniotic, third trimester: Secondary | ICD-10-CM

## 2015-11-09 MED ORDER — ONDANSETRON HCL 8 MG PO TABS
8.0000 mg | ORAL_TABLET | Freq: Three times a day (TID) | ORAL | Status: DC | PRN
Start: 2015-11-09 — End: 2015-11-28

## 2015-11-09 MED ORDER — DOCUSATE SODIUM 100 MG PO CAPS: 100.0000 mg | ORAL_CAPSULE | Freq: Every day | ORAL | Status: DC

## 2015-11-09 MED ORDER — NIFEDIPINE ER 30 MG PO TB24: 30.0000 mg | ORAL_TABLET | Freq: Two times a day (BID) | ORAL | Status: DC

## 2015-11-09 MED ORDER — ZOLPIDEM TARTRATE 5 MG PO TABS: 5.0000 mg | ORAL_TABLET | Freq: Every evening | ORAL | Status: DC | PRN

## 2015-11-09 NOTE — Progress Notes (Signed)
Patient ID: Julia Fowler, female   DOB: 1985-10-15, 30 y.o.   MRN: [id number]  Hospital Day: 8  S: Preterm labor symptoms: no complaints.  Desires to go home.    O: Blood pressure 134/80, pulse 107, temperature 98.3 F (36.8 C), temperature source Oral, resp. rate 20, height 4\' 11"  (1.499 m), weight 218 lb 6.4 oz (99.066 kg), last menstrual period 03/04/2015, SpO2 98 %, currently breastfeeding.   WUJ:WJXBJYNWFHT:Baseline: A: 160; B: 155 bpm, Variability: Good {> 6 bpm), Accelerations: Reactive and Decelerations: Variable: mild Toco: None GNF:AOZHYQMVSVE:Dilation: Fingertip Station: -3 Exam by:: J.Thornton, RN  A/P- 30 y.o. admitted with:Twins. Abnormal BPP and UC's. Normal AFI and reactive NST. Responded well to bedrest. Continue bedrest.  Present on Admission:  **None**  Pregnancy Complications: preterm labor   Preterm labor management: bedrest advised and pelvic rest advised Dating:  7663w5d PNL Needed:  BPP today FWB:  Good  PTL:  stable

## 2015-11-09 NOTE — Discharge Summary (Signed)
OB Discharge Summary     Patient Name: Julia Fowler DOB: 08/20/1985 MRN: [id number]  Date of admission: 11/02/2015 Delivering MD: This patient has no babies on file.  Date of discharge: 11/09/2015  Admitting diagnosis: MONITORING Intrauterine pregnancy: 3441w5d     Secondary diagnosis:  Active Problems:   Twins  Additional problems: none     Discharge diagnosis: Twin gestation pregnancy, polyhydramnios twin B                                                                                                 Complications: None  Hospital course:  Sceduled C/S   30 y.o. yo [id number] at 1041w5d was admitted to the hospital 11/02/2015 for scheduled cesarean section with the following indication:Prior Uterine Surgery.    Physical exam  Filed Vitals:   11/09/15 0814 11/09/15 0935 11/09/15 0955 11/09/15 1230  BP: 151/93 134/80  149/75  Pulse: 88 107  98  Temp: 98.3 F (36.8 C)   97.9 F (36.6 C)  TempSrc: Oral   Oral  Resp: 20   18  Height:      Weight:   218 lb 6.4 oz (99.066 kg)   SpO2:       General: alert, cooperative and no distress DVT Evaluation: No evidence of DVT seen on physical exam. Labs: Lab Results  Component Value Date   WBC 7.9 11/02/2015   HGB 11.3* 11/02/2015   HCT 34.1* 11/02/2015   MCV 89.3 11/02/2015   PLT 209 11/02/2015   CMP Latest Ref Rng 01/26/2014  Glucose 70 - 99 mg/dL 89  BUN 6 - 23 mg/dL 16  Creatinine 1.470.50 - 8.291.10 mg/dL 5.620.56  Sodium 130137 - 865147 mEq/L 134(L)  Potassium 3.7 - 5.3 mEq/L 3.7  Chloride 96 - 112 mEq/L 99  CO2 19 - 32 mEq/L 22  Calcium 8.4 - 10.5 mg/dL 8.3(L)  Total Protein 6.0 - 8.3 g/dL 6.2  Total Bilirubin 0.3 - 1.2 mg/dL 0.3  Alkaline Phos 39 - 117 U/L 85  AST 0 - 37 U/L 30  ALT 0 - 35 U/L 23    Discharge instruction: per After Visit Summary and "Baby and Me Booklet".  After visit meds:    Medication List    TAKE these medications        calcium carbonate 500 MG chewable tablet  Commonly known as:  TUMS - dosed in mg  elemental calcium  Chew 1 tablet by mouth daily as needed for indigestion or heartburn.     cyclobenzaprine 10 MG tablet  Commonly known as:  FLEXERIL  Take 1 tablet (10 mg total) by mouth every 8 (eight) hours as needed for muscle spasms.     docusate sodium 100 MG capsule  Commonly known as:  COLACE  Take 1 capsule (100 mg total) by mouth daily.     NIFEdipine 30 MG 24 hr tablet  Commonly known as:  PROCARDIA-XL/ADALAT CC  Take 1 tablet (30 mg total) by mouth 2 (two) times daily.     OB COMPLETE PETITE 35-5-1-200 MG Caps  Take 1 capsule by mouth at  bedtime.     omeprazole 20 MG capsule  Commonly known as:  PRILOSEC  Take 1 capsule (20 mg total) by mouth 2 (two) times daily before a meal.     ondansetron 8 MG tablet  Commonly known as:  ZOFRAN  Take 1 tablet (8 mg total) by mouth every 8 (eight) hours as needed for nausea or vomiting.        Diet: routine diet  Activity: Out of work, limited activity.   Outpatient follow up:1 week in office with NST([facility]).  Follow up Appt:Future Appointments Date Time Provider Department Center  11/16/2015 10:30 AM [facility] [place] [facility]   Follow up Visit:No Follow-up on file.    11/09/2015 Roe Coombs, CNM

## 2015-11-10 ENCOUNTER — Telehealth: Payer: Self-pay

## 2015-11-10 ENCOUNTER — Other Ambulatory Visit: Payer: Self-pay | Admitting: *Deleted

## 2015-11-10 NOTE — Telephone Encounter (Signed)
Patient's FMLA paperwork is ready - tried to call her, but phone rang and rang and rang - she will owe 15.00

## 2015-11-14 ENCOUNTER — Ambulatory Visit (INDEPENDENT_AMBULATORY_CARE_PROVIDER_SITE_OTHER): Payer: Medicaid Other | Admitting: Obstetrics

## 2015-11-14 ENCOUNTER — Encounter (HOSPITAL_COMMUNITY): Payer: Self-pay

## 2015-11-14 ENCOUNTER — Encounter: Payer: Medicaid Other | Admitting: Obstetrics

## 2015-11-14 VITALS — BP 126/81 | HR 88 | Wt 224.0 lb

## 2015-11-14 DIAGNOSIS — O30043 Twin pregnancy, dichorionic/diamniotic, third trimester: Secondary | ICD-10-CM

## 2015-11-14 NOTE — Progress Notes (Signed)
Patient has no concerns ready for C-section 5-17

## 2015-11-15 ENCOUNTER — Encounter: Payer: Self-pay | Admitting: Obstetrics

## 2015-11-15 NOTE — Progress Notes (Signed)
Subjective:    Julia Fowler is being seen today for her obstetrical visit.  She is at 2367w4d gestation. Patient reports no complaints.   Fetal Movement: normal.   Problem List Items Addressed This Visit    None     Patient Active Problem List   Diagnosis Date Noted  . Twins 11/03/2015  . [redacted] weeks gestation of pregnancy   . Hereditary disease in family possibly affecting fetus, affecting management of mother, antepartum condition or complication   . Cesarean delivery, without mention of indication, delivered, with or without mention of antepartum condition 01/21/2014  . Excessive fetal growth 12/28/2013  . History of C-section 07/14/2013  . Hypothyroid 04/10/2013  . BMI 37.0-37.9, adult 04/10/2013  . Rickets 04/10/2013  . Family history of breast cancer in first degree relative 04/10/2013   Objective:    BP 126/81 mmHg  Pulse 88  Wt 224 lb (101.606 kg)  LMP 03/04/2015 FHT:  Baby A: 150 BPM;  Baby B:  140 BPM  Uterine Size: size greater than dates  Presentations: Baby A: unsure; Baby A: unsure    Assessment:    Pregnancy 36 and 3/7 weeks. Twins, dichorionic, diamniotic.   Plan:    Beta Strep Culture: not done yet. Pediatrician discussed. Infant feeding: plans to breastfeed.. Fetal surveillance for twins discussed.  Plan to initiate NSTs at 36 weeks. Ultrasound completed.  Fetal positions and related modes of delivery discussed. Follow-up: 1 Week.

## 2015-11-16 ENCOUNTER — Ambulatory Visit (HOSPITAL_COMMUNITY): Payer: Medicaid Other

## 2015-11-18 ENCOUNTER — Other Ambulatory Visit (HOSPITAL_COMMUNITY): Payer: Self-pay | Admitting: Maternal and Fetal Medicine

## 2015-11-18 ENCOUNTER — Encounter (HOSPITAL_COMMUNITY): Payer: Self-pay

## 2015-11-18 ENCOUNTER — Ambulatory Visit (HOSPITAL_COMMUNITY)
Admission: RE | Admit: 2015-11-18 | Discharge: 2015-11-18 | Disposition: A | Discharge status: Home / Self Care | Payer: Medicaid Other | Source: Ambulatory Visit | Attending: Maternal and Fetal Medicine | Admitting: Maternal and Fetal Medicine

## 2015-11-18 DIAGNOSIS — O30043 Twin pregnancy, dichorionic/diamniotic, third trimester: Secondary | ICD-10-CM | POA: Insufficient documentation

## 2015-11-18 DIAGNOSIS — O99213 Obesity complicating pregnancy, third trimester: Secondary | ICD-10-CM

## 2015-11-18 DIAGNOSIS — Z3A37 37 weeks gestation of pregnancy: Secondary | ICD-10-CM

## 2015-11-18 DIAGNOSIS — O403XX2 Polyhydramnios, third trimester, fetus 2: Secondary | ICD-10-CM

## 2015-11-18 DIAGNOSIS — O34219 Maternal care for unspecified type scar from previous cesarean delivery: Secondary | ICD-10-CM | POA: Diagnosis not present

## 2015-11-18 DIAGNOSIS — Z3A27 27 weeks gestation of pregnancy: Secondary | ICD-10-CM | POA: Insufficient documentation

## 2015-11-18 DIAGNOSIS — Z8489 Family history of other specified conditions: Secondary | ICD-10-CM | POA: Diagnosis not present

## 2015-11-18 DIAGNOSIS — IMO0002 Reserved for concepts with insufficient information to code with codable children: Secondary | ICD-10-CM

## 2015-11-21 ENCOUNTER — Ambulatory Visit (INDEPENDENT_AMBULATORY_CARE_PROVIDER_SITE_OTHER): Payer: Medicaid Other | Admitting: Obstetrics

## 2015-11-21 DIAGNOSIS — O30043 Twin pregnancy, dichorionic/diamniotic, third trimester: Secondary | ICD-10-CM

## 2015-11-22 ENCOUNTER — Encounter (HOSPITAL_COMMUNITY)
Admission: RE | Admit: 2015-11-22 | Discharge: 2015-11-22 | Disposition: A | Discharge status: Home / Self Care | Payer: Medicaid Other | Source: Ambulatory Visit

## 2015-11-22 ENCOUNTER — Encounter: Payer: Self-pay | Admitting: Obstetrics

## 2015-11-22 NOTE — Progress Notes (Signed)
Subjective:    Julia Fowler is being seen today for her obstetrical visit.  She is at 5774w4d gestation. Patient reports no complaints.   Fetal Movement: normal.   Problem List Items Addressed This Visit    None     Patient Active Problem List   Diagnosis Date Noted  . Twins 11/03/2015  . [redacted] weeks gestation of pregnancy   . Hereditary disease in family possibly affecting fetus, affecting management of mother, antepartum condition or complication   . Cesarean delivery, without mention of indication, delivered, with or without mention of antepartum condition 01/21/2014  . Excessive fetal growth 12/28/2013  . History of C-section 07/14/2013  . Hypothyroid 04/10/2013  . BMI 37.0-37.9, adult 04/10/2013  . Rickets 04/10/2013  . Family history of breast cancer in first degree relative 04/10/2013   Objective:    LMP 03/04/2015 FHT:  Baby A: 150 BPM;  Baby B:  140 BPM  Uterine Size: size greater than dates  Presentations: Baby A: unsure; Baby A: unsure    Assessment:    Pregnancy 37 and 4/7 weeks. Twins, dichorionic.   Plan:    Beta Strep Culture: not done. Pediatrician discussed. Infant feeding: plans to breastfeed.. Fetal surveillance for twins discussed.  Plan to do repeat C/S at 38 weeks. Fetal positions and related modes of delivery discussed. Follow-up: 1 Week.

## 2015-11-23 ENCOUNTER — Inpatient Hospital Stay (HOSPITAL_COMMUNITY): Admission: RE | Admit: 2015-11-23 | Payer: Medicaid Other | Source: Ambulatory Visit

## 2015-11-23 ENCOUNTER — Inpatient Hospital Stay (HOSPITAL_COMMUNITY)
Admission: RE | Admit: 2015-11-23 | Discharge: 2015-11-26 | DRG: 765 | Disposition: A | Discharge status: Home / Self Care | Payer: Medicaid Other | Source: Ambulatory Visit | Attending: Obstetrics | Admitting: Obstetrics

## 2015-11-23 ENCOUNTER — Encounter (HOSPITAL_COMMUNITY)
Admission: RE | Disposition: A | Discharge status: Home / Self Care | Payer: Self-pay | Source: Ambulatory Visit | Attending: Obstetrics

## 2015-11-23 ENCOUNTER — Inpatient Hospital Stay (HOSPITAL_COMMUNITY): Payer: Medicaid Other | Admitting: Anesthesiology

## 2015-11-23 ENCOUNTER — Encounter (HOSPITAL_COMMUNITY): Payer: Self-pay | Admitting: Emergency Medicine

## 2015-11-23 DIAGNOSIS — O99284 Endocrine, nutritional and metabolic diseases complicating childbirth: Secondary | ICD-10-CM | POA: Diagnosis present

## 2015-11-23 DIAGNOSIS — Z3A37 37 weeks gestation of pregnancy: Secondary | ICD-10-CM | POA: Diagnosis not present

## 2015-11-23 DIAGNOSIS — O9081 Anemia of the puerperium: Secondary | ICD-10-CM | POA: Diagnosis not present

## 2015-11-23 DIAGNOSIS — D649 Anemia, unspecified: Secondary | ICD-10-CM | POA: Diagnosis not present

## 2015-11-23 DIAGNOSIS — O328XX2 Maternal care for other malpresentation of fetus, fetus 2: Secondary | ICD-10-CM | POA: Diagnosis present

## 2015-11-23 DIAGNOSIS — O34211 Maternal care for low transverse scar from previous cesarean delivery: Secondary | ICD-10-CM | POA: Diagnosis present

## 2015-11-23 DIAGNOSIS — O30043 Twin pregnancy, dichorionic/diamniotic, third trimester: Secondary | ICD-10-CM | POA: Diagnosis present

## 2015-11-23 DIAGNOSIS — Z98891 History of uterine scar from previous surgery: Secondary | ICD-10-CM

## 2015-11-23 DIAGNOSIS — E039 Hypothyroidism, unspecified: Secondary | ICD-10-CM | POA: Diagnosis present

## 2015-11-23 DIAGNOSIS — Z6841 Body Mass Index (BMI) 40.0 and over, adult: Secondary | ICD-10-CM

## 2015-11-23 DIAGNOSIS — O99214 Obesity complicating childbirth: Secondary | ICD-10-CM | POA: Diagnosis present

## 2015-11-23 DIAGNOSIS — Z833 Family history of diabetes mellitus: Secondary | ICD-10-CM | POA: Diagnosis not present

## 2015-11-23 LAB — CBC
HCT: 35.6 % — ABNORMAL LOW (ref 36.0–46.0)
HEMOGLOBIN: 12.1 g/dL (ref 12.0–15.0)
MCH: 30.2 pg (ref 26.0–34.0)
MCHC: 34 g/dL (ref 30.0–36.0)
MCV: 88.8 fL (ref 78.0–100.0)
Platelets: 231 10*3/uL (ref 150–400)
RBC: 4.01 MIL/uL (ref 3.87–5.11)
RDW: 14.2 % (ref 11.5–15.5)
WBC: 8.3 10*3/uL (ref 4.0–10.5)

## 2015-11-23 LAB — PREPARE RBC (CROSSMATCH)

## 2015-11-23 SURGERY — Surgical Case
Anesthesia: Regional

## 2015-11-23 MED ORDER — SIMETHICONE 80 MG PO CHEW: 80.0000 mg | CHEWABLE_TABLET | ORAL | Status: DC | PRN

## 2015-11-23 MED ORDER — ONDANSETRON HCL 4 MG/2ML IJ SOLN: 4.0000 mg | Freq: Three times a day (TID) | INTRAMUSCULAR | Status: DC | PRN

## 2015-11-23 MED ORDER — KETOROLAC TROMETHAMINE 30 MG/ML IJ SOLN: 30.0000 mg | Freq: Four times a day (QID) | INTRAMUSCULAR | Status: AC | PRN

## 2015-11-23 MED ORDER — LACTATED RINGERS IV SOLN
Freq: Once | INTRAVENOUS | Status: AC
  Administered 2015-11-23: 10:00:00 via INTRAVENOUS

## 2015-11-23 MED ORDER — FENTANYL CITRATE (PF) 100 MCG/2ML IJ SOLN
25.0000 ug | INTRAMUSCULAR | Status: DC | PRN
  Administered 2015-11-23: 10 ug via INTRAVENOUS

## 2015-11-23 MED ORDER — PHENYLEPHRINE 8 MG IN D5W 100 ML (0.08MG/ML) PREMIX OPTIME
INJECTION | INTRAVENOUS | Status: DC | PRN
  Administered 2015-11-23: 30 ug/min via INTRAVENOUS

## 2015-11-23 MED ORDER — BUPIVACAINE IN DEXTROSE 0.75-8.25 % IT SOLN
INTRATHECAL | Status: DC | PRN
Start: 2015-11-23 — End: 2015-11-23
  Administered 2015-11-23: 1.4 mL via INTRATHECAL

## 2015-11-23 MED ORDER — ONDANSETRON HCL 4 MG/2ML IJ SOLN
INTRAMUSCULAR | Status: DC | PRN
  Administered 2015-11-23: 4 mg via INTRAVENOUS

## 2015-11-23 MED ORDER — PRENATAL MULTIVITAMIN CH
1.0000 | ORAL_TABLET | Freq: Every day | ORAL | Status: DC
  Administered 2015-11-24 – 2015-11-25 (×2): 1 via ORAL
  Filled 2015-11-23 (×2): qty 1

## 2015-11-23 MED ORDER — IBUPROFEN 600 MG PO TABS
600.0000 mg | ORAL_TABLET | Freq: Four times a day (QID) | ORAL | Status: DC
  Administered 2015-11-23 – 2015-11-26 (×11): 600 mg via ORAL
  Filled 2015-11-23 (×11): qty 1

## 2015-11-23 MED ORDER — CEFAZOLIN SODIUM-DEXTROSE 2-4 GM/100ML-% IV SOLN
2.0000 g | INTRAVENOUS | Status: AC
  Administered 2015-11-23: 2 g via INTRAVENOUS

## 2015-11-23 MED ORDER — ATROPINE SULFATE 1 MG/10ML IJ SOSY
PREFILLED_SYRINGE | INTRAMUSCULAR | Status: AC
  Filled 2015-11-23: qty 10

## 2015-11-23 MED ORDER — ACETAMINOPHEN 500 MG PO TABS
1000.0000 mg | ORAL_TABLET | Freq: Four times a day (QID) | ORAL | Status: AC
  Administered 2015-11-23: 1000 mg via ORAL
  Filled 2015-11-23: qty 2

## 2015-11-23 MED ORDER — OXYTOCIN 40 UNITS IN LACTATED RINGERS INFUSION - SIMPLE MED: 2.5000 [IU]/h | INTRAVENOUS | Status: AC

## 2015-11-23 MED ORDER — SIMETHICONE 80 MG PO CHEW
80.0000 mg | CHEWABLE_TABLET | Freq: Three times a day (TID) | ORAL | Status: DC
  Administered 2015-11-23 – 2015-11-26 (×8): 80 mg via ORAL
  Filled 2015-11-23 (×8): qty 1

## 2015-11-23 MED ORDER — DIPHENHYDRAMINE HCL 25 MG PO CAPS: 25.0000 mg | ORAL_CAPSULE | ORAL | Status: DC | PRN

## 2015-11-23 MED ORDER — OXYCODONE-ACETAMINOPHEN 5-325 MG PO TABS
2.0000 | ORAL_TABLET | ORAL | Status: DC | PRN
  Administered 2015-11-24 – 2015-11-26 (×11): 2 via ORAL
  Filled 2015-11-23 (×11): qty 2

## 2015-11-23 MED ORDER — SCOPOLAMINE 1 MG/3DAYS TD PT72
1.0000 | MEDICATED_PATCH | Freq: Once | TRANSDERMAL | Status: AC
  Administered 2015-11-23: 1.5 mg via TRANSDERMAL

## 2015-11-23 MED ORDER — OXYCODONE-ACETAMINOPHEN 5-325 MG PO TABS
1.0000 | ORAL_TABLET | ORAL | Status: DC | PRN
  Administered 2015-11-23 – 2015-11-24 (×2): 1 via ORAL
  Filled 2015-11-23 (×2): qty 1

## 2015-11-23 MED ORDER — DIPHENHYDRAMINE HCL 50 MG/ML IJ SOLN: 12.5000 mg | INTRAMUSCULAR | Status: DC | PRN

## 2015-11-23 MED ORDER — FENTANYL CITRATE (PF) 100 MCG/2ML IJ SOLN
INTRAMUSCULAR | Status: AC
  Filled 2015-11-23: qty 2

## 2015-11-23 MED ORDER — ACETAMINOPHEN 325 MG PO TABS: 650.0000 mg | ORAL_TABLET | ORAL | Status: DC | PRN

## 2015-11-23 MED ORDER — KETOROLAC TROMETHAMINE 30 MG/ML IJ SOLN
30.0000 mg | Freq: Four times a day (QID) | INTRAMUSCULAR | Status: AC | PRN
  Administered 2015-11-23: 30 mg via INTRAMUSCULAR

## 2015-11-23 MED ORDER — ONDANSETRON HCL 4 MG/2ML IJ SOLN: 4.0000 mg | Freq: Once | INTRAMUSCULAR | Status: DC | PRN

## 2015-11-23 MED ORDER — SCOPOLAMINE 1 MG/3DAYS TD PT72
MEDICATED_PATCH | TRANSDERMAL | Status: AC
  Administered 2015-11-23: 1.5 mg via TRANSDERMAL
  Filled 2015-11-23: qty 1

## 2015-11-23 MED ORDER — NALOXONE HCL 0.4 MG/ML IJ SOLN: 0.4000 mg | INTRAMUSCULAR | Status: DC | PRN

## 2015-11-23 MED ORDER — SODIUM CHLORIDE 0.9% FLUSH: 3.0000 mL | INTRAVENOUS | Status: DC | PRN

## 2015-11-23 MED ORDER — COCONUT OIL OIL
1.0000 "application " | TOPICAL_OIL | Status: DC | PRN
  Filled 2015-11-23: qty 120

## 2015-11-23 MED ORDER — MEPERIDINE HCL 25 MG/ML IJ SOLN: 6.2500 mg | INTRAMUSCULAR | Status: DC | PRN

## 2015-11-23 MED ORDER — KETOROLAC TROMETHAMINE 30 MG/ML IJ SOLN
INTRAMUSCULAR | Status: AC
  Filled 2015-11-23: qty 1

## 2015-11-23 MED ORDER — DIBUCAINE 1 % RE OINT: 1.0000 "application " | TOPICAL_OINTMENT | RECTAL | Status: DC | PRN

## 2015-11-23 MED ORDER — DIPHENHYDRAMINE HCL 25 MG PO CAPS
25.0000 mg | ORAL_CAPSULE | Freq: Four times a day (QID) | ORAL | Status: DC | PRN
  Administered 2015-11-24: 25 mg via ORAL
  Filled 2015-11-23: qty 1

## 2015-11-23 MED ORDER — LACTATED RINGERS IV SOLN
INTRAVENOUS | Status: DC | PRN
  Administered 2015-11-23: 11:00:00 via INTRAVENOUS

## 2015-11-23 MED ORDER — OXYTOCIN 10 UNIT/ML IJ SOLN
40.0000 [IU] | INTRAVENOUS | Status: DC | PRN
  Administered 2015-11-23: 40 [IU] via INTRAVENOUS

## 2015-11-23 MED ORDER — LACTATED RINGERS IV BOLUS (SEPSIS)
1000.0000 mL | Freq: Once | INTRAVENOUS | Status: AC
  Administered 2015-11-23: 1000 mL via INTRAVENOUS

## 2015-11-23 MED ORDER — TETANUS-DIPHTH-ACELL PERTUSSIS 5-2.5-18.5 LF-MCG/0.5 IM SUSP: 0.5000 mL | Freq: Once | INTRAMUSCULAR | Status: DC

## 2015-11-23 MED ORDER — MORPHINE SULFATE (PF) 0.5 MG/ML IJ SOLN
INTRAMUSCULAR | Status: AC
  Filled 2015-11-23: qty 10

## 2015-11-23 MED ORDER — NALBUPHINE HCL 10 MG/ML IJ SOLN
5.0000 mg | INTRAMUSCULAR | Status: DC | PRN
  Administered 2015-11-23 – 2015-11-24 (×2): 5 mg via SUBCUTANEOUS
  Filled 2015-11-23 (×2): qty 1

## 2015-11-23 MED ORDER — MORPHINE SULFATE (PF) 0.5 MG/ML IJ SOLN
INTRAMUSCULAR | Status: DC | PRN
  Administered 2015-11-23: .2 mg via EPIDURAL

## 2015-11-23 MED ORDER — LACTATED RINGERS IV SOLN
INTRAVENOUS | Status: DC
  Administered 2015-11-23: 17:00:00 via INTRAVENOUS

## 2015-11-23 MED ORDER — NALBUPHINE HCL 10 MG/ML IJ SOLN: 5.0000 mg | Freq: Once | INTRAMUSCULAR | Status: AC | PRN

## 2015-11-23 MED ORDER — BUPIVACAINE IN DEXTROSE 0.75-8.25 % IT SOLN
INTRATHECAL | Status: AC
  Filled 2015-11-23: qty 2

## 2015-11-23 MED ORDER — WITCH HAZEL-GLYCERIN EX PADS: 1.0000 "application " | MEDICATED_PAD | CUTANEOUS | Status: DC | PRN

## 2015-11-23 MED ORDER — PHENYLEPHRINE 8 MG IN D5W 100 ML (0.08MG/ML) PREMIX OPTIME
INJECTION | INTRAVENOUS | Status: AC
  Filled 2015-11-23: qty 100

## 2015-11-23 MED ORDER — SCOPOLAMINE 1 MG/3DAYS TD PT72
1.0000 | MEDICATED_PATCH | Freq: Once | TRANSDERMAL | Status: DC
  Filled 2015-11-23: qty 1

## 2015-11-23 MED ORDER — LACTATED RINGERS IV SOLN
INTRAVENOUS | Status: DC
  Administered 2015-11-23 (×2): via INTRAVENOUS

## 2015-11-23 MED ORDER — MENTHOL 3 MG MT LOZG: 1.0000 | LOZENGE | OROMUCOSAL | Status: DC | PRN

## 2015-11-23 MED ORDER — CEFAZOLIN SODIUM-DEXTROSE 2-4 GM/100ML-% IV SOLN
INTRAVENOUS | Status: AC
  Filled 2015-11-23: qty 100

## 2015-11-23 MED ORDER — ZOLPIDEM TARTRATE 5 MG PO TABS: 5.0000 mg | ORAL_TABLET | Freq: Every evening | ORAL | Status: DC | PRN

## 2015-11-23 MED ORDER — SENNOSIDES-DOCUSATE SODIUM 8.6-50 MG PO TABS
2.0000 | ORAL_TABLET | ORAL | Status: DC
  Administered 2015-11-23 – 2015-11-25 (×3): 2 via ORAL
  Filled 2015-11-23 (×3): qty 2

## 2015-11-23 MED ORDER — NALBUPHINE HCL 10 MG/ML IJ SOLN
5.0000 mg | Freq: Once | INTRAMUSCULAR | Status: AC | PRN
  Administered 2015-11-23: 5 mg via INTRAVENOUS

## 2015-11-23 MED ORDER — SIMETHICONE 80 MG PO CHEW
80.0000 mg | CHEWABLE_TABLET | ORAL | Status: DC
  Administered 2015-11-23 – 2015-11-25 (×3): 80 mg via ORAL
  Filled 2015-11-23 (×3): qty 1

## 2015-11-23 MED ORDER — NALOXONE HCL 2 MG/2ML IJ SOSY
1.0000 ug/kg/h | PREFILLED_SYRINGE | INTRAMUSCULAR | Status: DC | PRN
  Filled 2015-11-23: qty 2

## 2015-11-23 MED ORDER — NALBUPHINE HCL 10 MG/ML IJ SOLN
5.0000 mg | INTRAMUSCULAR | Status: DC | PRN
  Filled 2015-11-23: qty 1

## 2015-11-23 SURGICAL SUPPLY — 39 items
CHLORAPREP W/TINT 26ML (MISCELLANEOUS) ×2 IMPLANT
CLAMP CORD UMBIL (MISCELLANEOUS) ×2 IMPLANT
CLOTH BEACON ORANGE TIMEOUT ST (SAFETY) ×2 IMPLANT
CONTAINER PREFILL 10% NBF 15ML (MISCELLANEOUS) ×4 IMPLANT
DRSG OPSITE POSTOP 4X10 (GAUZE/BANDAGES/DRESSINGS) ×2 IMPLANT
ELECT REM PT RETURN 9FT ADLT (ELECTROSURGICAL) ×2
ELECTRODE REM PT RTRN 9FT ADLT (ELECTROSURGICAL) ×1 IMPLANT
EXTRACTOR VACUUM M CUP 4 TUBE (SUCTIONS) IMPLANT
GLOVE BIO SURGEON STRL SZ8 (GLOVE) ×2 IMPLANT
GLOVE BIOGEL PI IND STRL 7.0 (GLOVE) ×2 IMPLANT
GLOVE BIOGEL PI INDICATOR 7.0 (GLOVE) ×2
GOWN STRL REUS W/TWL LRG LVL3 (GOWN DISPOSABLE) ×4 IMPLANT
KIT ABG SYR 3ML LUER SLIP (SYRINGE) IMPLANT
LIQUID BAND (GAUZE/BANDAGES/DRESSINGS) ×2 IMPLANT
NEEDLE HYPO 22GX1.5 SAFETY (NEEDLE) ×2 IMPLANT
NEEDLE HYPO 25X5/8 SAFETYGLIDE (NEEDLE) ×2 IMPLANT
NS IRRIG 1000ML POUR BTL (IV SOLUTION) ×2 IMPLANT
PACK C SECTION WH (CUSTOM PROCEDURE TRAY) ×2 IMPLANT
PAD ABD 7.5X8 STRL (GAUZE/BANDAGES/DRESSINGS) ×2 IMPLANT
PAD OB MATERNITY 4.3X12.25 (PERSONAL CARE ITEMS) ×2 IMPLANT
PENCIL SMOKE EVAC W/HOLSTER (ELECTROSURGICAL) ×2 IMPLANT
RTRCTR C-SECT PINK 25CM LRG (MISCELLANEOUS) ×2 IMPLANT
SPONGE GAUZE 4X4 12PLY STER LF (GAUZE/BANDAGES/DRESSINGS) ×4 IMPLANT
STAPLER VISISTAT 35W (STAPLE) ×2 IMPLANT
SUT GUT PLAIN 0 CT-3 TAN 27 (SUTURE) IMPLANT
SUT MNCRL 0 VIOLET CTX 36 (SUTURE) ×3 IMPLANT
SUT MNCRL AB 4-0 PS2 18 (SUTURE) IMPLANT
SUT MON AB 2-0 CT1 27 (SUTURE) ×2 IMPLANT
SUT MON AB 3-0 SH 27 (SUTURE)
SUT MON AB 3-0 SH27 (SUTURE) IMPLANT
SUT MONOCRYL 0 CTX 36 (SUTURE) ×3
SUT PLAIN 2 0 XLH (SUTURE) IMPLANT
SUT VIC AB 0 CTX 36 (SUTURE) ×3
SUT VIC AB 0 CTX36XBRD ANBCTRL (SUTURE) ×3 IMPLANT
SYR BULB 3OZ (MISCELLANEOUS) ×2 IMPLANT
SYR CONTROL 10ML LL (SYRINGE) ×2 IMPLANT
TAPE CLOTH SURG 6X10 WHT LF (GAUZE/BANDAGES/DRESSINGS) ×2 IMPLANT
TOWEL OR 17X24 6PK STRL BLUE (TOWEL DISPOSABLE) ×2 IMPLANT
TRAY FOLEY CATH SILVER 14FR (SET/KITS/TRAYS/PACK) ×2 IMPLANT

## 2015-11-23 NOTE — Anesthesia Preprocedure Evaluation (Signed)
Anesthesia Evaluation  Patient identified by MRN, date of birth, ID band Patient awake    Reviewed: Allergy & Precautions, NPO status , Patient's Chart, lab work & pertinent test results  History of Anesthesia Complications Negative for: history of anesthetic complications  Airway Mallampati: III  TM Distance: >3 FB Neck ROM: Full    Dental no notable dental hx. (+) Partial Upper, Dental Advisory Given   Pulmonary neg pulmonary ROS,    Pulmonary exam normal breath sounds clear to auscultation       Cardiovascular negative cardio ROS Normal cardiovascular exam Rhythm:Regular Rate:Normal     Neuro/Psych negative neurological ROS  negative psych ROS   GI/Hepatic negative GI ROS, Neg liver ROS,   Endo/Other  Hypothyroidism Morbid obesity  Renal/GU negative Renal ROS  negative genitourinary   Musculoskeletal negative musculoskeletal ROS (+)   Abdominal   Peds negative pediatric ROS (+)  Hematology negative hematology ROS (+)   Anesthesia Other Findings   Reproductive/Obstetrics (+) Pregnancy Prior C/S x2, twin gestation                             Anesthesia Physical Anesthesia Plan  ASA: III  Anesthesia Plan: Combined Spinal and Epidural   Post-op Pain Management:    Induction:   Airway Management Planned:   Additional Equipment:   Intra-op Plan:   Post-operative Plan:   Informed Consent: I have reviewed the patients History and Physical, chart, labs and discussed the procedure including the risks, benefits and alternatives for the proposed anesthesia with the patient or authorized representative who has indicated his/her understanding and acceptance.   Dental advisory given  Plan Discussed with: CRNA  Anesthesia Plan Comments:         Anesthesia Quick Evaluation

## 2015-11-23 NOTE — Anesthesia Postprocedure Evaluation (Signed)
Anesthesia Post Note  Patient: Julia BingLisa R Dougan  Procedure(s) Performed: Procedure(s) (LRB): CESAREAN SECTION MULTI-GESTATIONAL/TWINS (N/A)  Patient location during evaluation: PACU Anesthesia Type: Epidural Level of consciousness: awake and alert Pain management: pain level controlled Vital Signs Assessment: post-procedure vital signs reviewed and stable Respiratory status: spontaneous breathing, nonlabored ventilation, respiratory function stable and patient connected to nasal cannula oxygen Cardiovascular status: blood pressure returned to baseline and stable Postop Assessment: no signs of nausea or vomiting, spinal receding and patient able to bend at knees Anesthetic complications: no     Last Vitals:  Filed Vitals:   11/23/15 1350 11/23/15 1500  BP: 138/68 140/78  Pulse: 71 77  Temp:    Resp: 18 18    Last Pain:  Filed Vitals:   11/23/15 1510  PainSc: 0-No pain   Pain Goal: Patients Stated Pain Goal: 5 (11/23/15 1011)               Jaevion Goto JENNETTE

## 2015-11-23 NOTE — Transfer of Care (Signed)
Immediate Anesthesia Transfer of Care Note  Patient: Julia Fowler  Procedure(s) Performed: Procedure(s): CESAREAN SECTION MULTI-GESTATIONAL/TWINS (N/A)  Patient Location: PACU  Anesthesia Type:Spinal  Level of Consciousness: awake, alert , oriented and patient cooperative  Airway & Oxygen Therapy: Patient Spontanous Breathing  Post-op Assessment: Report given to RN and Post -op Vital signs reviewed and stable  Post vital signs: Reviewed and stable  Last Vitals:  Filed Vitals:   11/23/15 1011  BP: 162/94  Pulse: 67  Temp: 37 C  Resp: 16    Last Pain:  Filed Vitals:   11/23/15 1139  PainSc: 0-No pain      Patients Stated Pain Goal: 5 (11/23/15 1011)  Complications: No apparent anesthesia complications

## 2015-11-23 NOTE — Progress Notes (Signed)
Dr.Harper notified of urine out put of 110 cc in 4 hours and amber colored. He ordered a 1000cc LR bolus.

## 2015-11-23 NOTE — Anesthesia Procedure Notes (Signed)
Epidural Patient location during procedure: OB  Staffing Anesthesiologist: Dnasia Gauna Performed by: anesthesiologist   Preanesthetic Checklist Completed: patient identified, site marked, surgical consent, pre-op evaluation, timeout performed, IV checked, risks and benefits discussed and monitors and equipment checked  Epidural Patient position: sitting Prep: site prepped and draped and DuraPrep Patient monitoring: continuous pulse ox and blood pressure Approach: midline Location: L3-L4 Injection technique: LOR saline  Needle:  Needle type: Tuohy  Needle gauge: 17 G Needle length: 9 cm and 9 Needle insertion depth: 6 cm Catheter type: closed end flexible Catheter size: 19 Gauge Catheter at skin depth: 10 cm Test dose: negative  Assessment Events: blood not aspirated, injection not painful, no injection resistance, negative IV test and no paresthesia  Additional Notes Patient identified. Risks/Benefits/Options discussed with patient including but not limited to bleeding, infection, nerve damage, paralysis, failed block, incomplete pain control, headache, blood pressure changes, nausea, vomiting, reactions to medication both or allergic, itching and postpartum back pain. Confirmed with bedside nurse the patient's most recent platelet count. Confirmed with patient that they are not currently taking any anticoagulation, have any bleeding history or any family history of bleeding disorders. Patient expressed understanding and wished to proceed. All questions were answered. Sterile technique was used throughout the entire procedure. Please see nursing notes for vital signs. Test dose was given through epidural catheter and negative prior to continuing to dose epidural or start infusion. Warning signs of high block given to the patient including shortness of breath, tingling/numbness in hands, complete motor block, or any concerning symptoms with instructions to call for help. Patient was  given instructions on fall risk and not to get out of bed. All questions and concerns addressed with instructions to call with any issues or inadequate analgesia.    This was a CSE

## 2015-11-23 NOTE — Anesthesia Postprocedure Evaluation (Signed)
Anesthesia Post Note  Patient: Julia BingLisa R Fowler  Procedure(s) Performed: Procedure(s) (LRB): CESAREAN SECTION MULTI-GESTATIONAL/TWINS (N/A)  Patient location during evaluation: Mother Baby Anesthesia Type: Spinal Level of consciousness: awake and alert and oriented Pain management: satisfactory to patient Vital Signs Assessment: post-procedure vital signs reviewed and stable Respiratory status: spontaneous breathing and nonlabored ventilation Cardiovascular status: stable Postop Assessment: no headache, no backache, patient able to bend at knees, no signs of nausea or vomiting and adequate PO intake Anesthetic complications: no     Last Vitals:  Filed Vitals:   11/23/15 1611 11/23/15 1700  BP: 130/78 128/76  Pulse: 80 74  Temp:    Resp: 18 18    Last Pain:  Filed Vitals:   11/23/15 1757  PainSc: 3    Pain Goal: Patients Stated Pain Goal: 5 (11/23/15 1011)               Nahiem Dredge

## 2015-11-23 NOTE — Addendum Note (Signed)
Addendum  created 11/23/15 1758 by Shanon PayorSuzanne M Luree Palla, CRNA   Modules edited: Clinical Notes   Clinical Notes:  File: [id number]; Pend: [id number]

## 2015-11-23 NOTE — Lactation Note (Addendum)
This note was copied from a baby's chart. Lactation Consultation Note  Patient Name: Julia Fowler ZOXWR'UToday's Date: 11/23/2015 Reason for consult: Initial assessment Babies at 5 hr of life. Upon entry baby A was sleeping in the basinet and baby B was in the nursery. Mom declined help with latch at this time, she reports baby A had just eaten. Mom reports baby A Girl has bf well but Baby B Boy did not bf well. Her daughter "never" latched so she exclusively pumped to feed for 7 months then pumped and offered formula for 12 months. She plans to bf, pump, and offer formula. She has a slow flow nipple in the room, discussed other ways to supplement. She requested oral syring. She has a personal DEBP with her. Set up the hospital DEBP but she will use her personal one if she feels like the "hospital pump is not working". Discussed LPT infant behavior, feeding frequency, supplementing guidelines, baby belly size, voids, wt loss, breast changes, and nipple care. She stated she can manually express, has seen colostrum bilaterally, has a spoon and bullets in the room. Given lactation and LPT infant handouts. Aware of OP services and support group.     Maternal Data Has patient been taught Hand Expression?: Yes Does the patient have breastfeeding experience prior to this delivery?: Yes  Feeding    LATCH Score/Interventions                      Lactation Tools Discussed/Used WIC Program: Yes Pump Review: Setup, frequency, and cleaning;Milk Storage Initiated by:: ES Date initiated:: 11/23/15   Consult Status Consult Status: Follow-up Date: 11/24/15 Follow-up type: In-patient    Julia Eisenmengerlizabeth E Damilola Fowler 11/23/2015, 4:56 PM

## 2015-11-23 NOTE — Op Note (Signed)
Cesarean Section Procedure Note   Elmore BingLisa R Mcanelly   11/23/2015  Indications: Scheduled Proceedure/Maternal Request   Pre-operative Diagnosis: PREV C/S/TWINS.   Post-operative Diagnosis: Same   Surgeon: Coral CeoHARPER,Arrion Burruel A  Assistants: Kathreen CosierMarshall, Bernard A.  Anesthesia: spinal  Procedure Details:  The patient was seen in the Holding Room. The risks, benefits, complications, treatment options, and expected outcomes were discussed with the patient. The patient concurred with the proposed plan, giving informed consent. The patient was identified as [place] BingLisa R Arm[place] and the procedure verified as C-Section Delivery. A Time Out was held and the above information confirmed.  After induction of anesthesia, the patient was draped and prepped in the usual sterile manner. A transverse incision was made and carried down through the subcutaneous tissue to the fascia. The fascial incision was made and extended transversely. The fascia was separated from the underlying rectus tissue superiorly and inferiorly. The peritoneum was identified and entered. The peritoneal incision was extended longitudinally. The utero-vesical peritoneal reflection was incised transversely and the bladder flap was bluntly freed from the lower uterine segment. A low transverse uterine incision was made. Delivered from cephalic presentation was a 2805 gram living newborn female infant(s).  Delivered from footling breech position was a 2130 gram living female infant.       APGAR (1 MIN):    Pam DrownBarnes, GirlA Vung [[id number]][[id number]]  3   Wayna ChaletBarnes, BoyB Kylah [[id number]][[id number]]  9  APGAR (5 MINS):    Pam DrownBarnes, GirlA Lashonne [[id number]][[id number]]  [address]9   Smylie, BoyB [place] [[id number]][[id number]]  9 APGAR (10 MINS):    Pam DrownBarnes, GirlA Sueann [[id number]][[id number]]    Wayna ChaletBarnes, BoyB Stefanee [[id number]][[id number]]     A cord ph was not sent. The umbilical cord was clamped and cut cord. A sample was obtained for evaluation. The placenta was removed Intact and appeared normal.  The uterine incision was closed with  running locked sutures of 0 Monocryl. A second imbricating layer of the same suture was placed.  Hemostasis was observed. The paracolic gutters were irrigated. The parieto peritoneum was closed in a running fashion with 2-0 Vicryl.  The fascia was then reapproximated with running sutures of 0 Vicryl.  The skin was closed with staples.  Instrument, sponge, and needle counts were correct prior the abdominal closure and were correct at the conclusion of the case.    Findings: Uterine serosal adhesions.  Normal ovaries and tubes   Estimated Blood Loss: 800ml  Total IV Fluids: 2350ml   Urine Output: 100CC OF clear urine  Specimens: Placenta to pathology  Complications: no complications  Disposition: PACU - hemodynamically stable.  Maternal Condition: stable   Baby condition / location:  Couplet care / Skin to Skin    Signed: Surgeon(s): Brock Badharles A Doll Frazee, MD Kathreen CosierBernard A Marshall, MD

## 2015-11-23 NOTE — H&P (Signed)
[place] Julia Fowler is a 30 y.o. female presenting for repeat C/S. Maternal Medical History:  Reason for admission: Twins.  Di/Di.  Previous C/S.  Wants repeat C/S.  Fetal activity: Perceived fetal activity is normal.   Last perceived fetal movement was within the past hour.    Prenatal Complications - Diabetes: none.    OB History    Gravida Para Term Preterm AB TAB SAB Ectopic Multiple Living   4 2 2  0 1 1 0 0 0 2     Past Medical History  Diagnosis Date  . Rickets   . Thyroid disease hypothyroid  . Seasonal allergies   . Hypothyroidism     no medication   Past Surgical History  Procedure Laterality Date  . Cesarean section    . Tooth extraction Left   . Cesarean section N/A 01/21/2014    Procedure: REPEAT CESAREAN SECTION;  Surgeon: Brock Badharles A Harper, MD;  Location: [facility];  Service: Obstetrics;  Laterality: N/A;   Family History: family history includes Cancer in her sister; Diabetes in her maternal grandmother. Social History:  reports that she has never smoked. She has never used smokeless tobacco. She reports that she does not drink alcohol or use illicit drugs.   Prenatal Transfer Tool  Maternal Diabetes: No Genetic Screening: Normal Maternal Ultrasounds/Referrals: Normal Fetal Ultrasounds or other Referrals:  Referred to Materal Fetal Medicine  Maternal Substance Abuse:  No Significant Maternal Medications:  None Significant Maternal Lab Results:  None Other Comments:  None  Review of Systems  Musculoskeletal: Positive for back pain.      Last menstrual period 03/04/2015, currently breastfeeding. Maternal Exam:  Abdomen: Patient reports no abdominal tenderness.   Physical Exam  Nursing note and vitals reviewed. Constitutional: She is oriented to person, place, and time. She appears well-developed and well-nourished.  HENT:  Head: Normocephalic and atraumatic.  Eyes: Conjunctivae are normal. Pupils are equal, round, and reactive to light.  Neck: Normal  range of motion. Neck supple.  Cardiovascular: Normal rate and regular rhythm.   Respiratory: Effort normal and breath sounds normal.  GI: Soft.  Genitourinary: Vagina normal and uterus normal.  Musculoskeletal: Normal range of motion.  Neurological: She is alert and oriented to person, place, and time.  Skin: Skin is warm and dry.  Psychiatric: She has a normal mood and affect. Her behavior is normal. Judgment and thought content normal.    Prenatal labs: ABO, Rh: --/--/A POS (05/02 1910) Antibody: NEG (05/02 1910) Rubella: 1.61 (10/25 1202) RPR: NON REAC (10/25 1202)  HBsAg: NEGATIVE (10/25 1202)  HIV: NONREACTIVE (10/25 1202)  GBS:     Assessment/Plan: 37.5 weeks.  Twins.  Previous C/S.  Desires repeat C/S.   HARPER,CHARLES A 11/23/2015, 9:46 AM

## 2015-11-24 LAB — CBC
HEMATOCRIT: 26.3 % — AB (ref 36.0–46.0)
HEMOGLOBIN: 8.8 g/dL — AB (ref 12.0–15.0)
MCH: 30.2 pg (ref 26.0–34.0)
MCHC: 33.5 g/dL (ref 30.0–36.0)
MCV: 90.4 fL (ref 78.0–100.0)
Platelets: 167 10*3/uL (ref 150–400)
RBC: 2.91 MIL/uL — ABNORMAL LOW (ref 3.87–5.11)
RDW: 14.5 % (ref 11.5–15.5)
WBC: 6.7 10*3/uL (ref 4.0–10.5)

## 2015-11-24 LAB — RPR: RPR: NONREACTIVE

## 2015-11-24 MED ORDER — POLYSACCHARIDE IRON COMPLEX 150 MG PO CAPS
150.0000 mg | ORAL_CAPSULE | Freq: Every day | ORAL | Status: DC
  Administered 2015-11-24 – 2015-11-26 (×3): 150 mg via ORAL
  Filled 2015-11-24 (×3): qty 1

## 2015-11-24 NOTE — Progress Notes (Signed)
Subjective: Postpartum Day #1: Cesarean Delivery Patient reports tolerating PO and no problems voiding.    Objective: Vital signs in last 24 hours: Temp:  [96.3 F (35.7 C)-98.6 F (37 C)] 98.6 F (37 C) (05/18 0815) Pulse Rate:  [63-99] 65 (05/18 0815) Resp:  [8-31] 16 (05/18 0815) BP: (99-162)/(60-94) 129/77 mmHg (05/18 0815) SpO2:  [97 %-100 %] 99 % (05/18 0815)  Physical Exam:  General: alert, cooperative and no distress Lochia: appropriate Uterine Fundus: firm Incision: no significant drainage DVT Evaluation: No evidence of DVT seen on physical exam. No cords or calf tenderness. Positive Homan's sign.   Recent Labs  11/23/15 0928 11/24/15 0639  HGB 12.1 8.8*  HCT 35.6* 26.3*    Assessment/Plan: Status post Cesarean section. Postoperative course complicated by Anemia  Continue current care.  Kalonji Zurawski A Roxanna Mcever 11/24/2015, 9:05 AM

## 2015-11-24 NOTE — Lactation Note (Signed)
This note was copied from a baby's chart. Lactation Consultation Note Will not stay latched on breast, off and on frequently not sufficient for a feeding. Supplemented w/Alimentum. Needs much stimulation. Mom has someone at bedside helping her. Mom falling a sleep holding baby's. Mom not able to comprehend anything discussed about BF, supplementing, etc. Encouraged friend to assure strict I&O.  Patient Name: Julia Fowler's Date: 11/24/2015 Reason for consult: Follow-up assessment;Infant < 6lbs;Difficult latch   Maternal Data    Feeding Feeding Type: Formula Nipple Type: Slow - flow  LATCH Score/Interventions Latch: Repeated attempts needed to sustain latch, nipple held in mouth throughout feeding, stimulation needed to elicit sucking reflex. Intervention(s): Adjust position;Assist with latch;Breast massage;Breast compression  Audible Swallowing: None Intervention(s): Skin to skin;Hand expression  Type of Nipple: Everted at rest and after stimulation  Comfort (Breast/Nipple): Soft / non-tender     Hold (Positioning): Full assist, staff holds infant at breast Intervention(s): Skin to skin;Position options;Support Pillows;Breastfeeding basics reviewed  LATCH Score: 5  Lactation Tools Discussed/Used Tools: Pump Breast pump type: Double-Electric Breast Pump   Consult Status Consult Status: Follow-up Date: 11/24/15 Follow-up type: In-patient    Charyl DancerCARVER, Meckenzie Balsley G 11/24/2015, 2:58 AM

## 2015-11-24 NOTE — Progress Notes (Signed)
UR chart review completed.  

## 2015-11-24 NOTE — Lactation Note (Signed)
This note was copied from a baby's chart. Lactation Consultation Note Baby will not substain a latch to breast. Mom holding baby STS. Has poor suck. No suck swallow coordination. Noted baby jittery. Reported to RN. Supplemented w/much stimulation 8 ml Similac 22 cal. But probably est. Only 5 ml d/t some spitting. No jitteriness noted. Baby was wrapped in 2 blankets and hat applied, that could had helped. Encouraged to keep baby warm and when STS keep 2 blankets on outside of body not facing mom. Encouraged strict I&O. Will need to stimulate to BF and supplement. Has had good output since birth.  Patient Name: Julia Fowler [id number]'s Date: 11/24/2015 Reason for consult: Follow-up assessment;Difficult latch;Infant < 6lbs   Maternal Data    Feeding Feeding Type: Formula Nipple Type: Slow - flow  LATCH Score/Interventions Latch: Too sleepy or reluctant, no latch achieved, no sucking elicited. Intervention(s): Skin to skin;Teach feeding cues;Waking techniques  Audible Swallowing: None Intervention(s): Skin to skin;Hand expression  Type of Nipple: Everted at rest and after stimulation  Comfort (Breast/Nipple): Soft / non-tender     Hold (Positioning): Full assist, staff holds infant at breast Intervention(s): Breastfeeding basics reviewed;Support Pillows;Position options;Skin to skin  LATCH Score: 4  Lactation Tools Discussed/Used Tools: Pump Breast pump type: Double-Electric Breast Pump   Consult Status Consult Status: Follow-up Date: 11/24/15 Follow-up type: In-patient    Elleen Coulibaly, Diamond NickelLAURA G 11/24/2015, 3:03 AM

## 2015-11-24 NOTE — Lactation Note (Signed)
This note was copied from a baby's chart. Lactation Consultation Note  Patient Name: Julia Fowler [id number]'WToday's Date: 11/24/2015   Visited with Mom, babies 6126 hrs old.  Mom has been exclusively bottle feeding babies due to sleepiness at the breast.  Reassured her and encouraged her to have them skin to skin as much as possible.  Mom very tired, and very itchy.  Pump noted off to the side, set up with the tubing.  Encouraged her to double pump on the initiation setting to support her milk supply.  Mom states she has a Medela Pump in [employer] at home, and knows the parts for the [employer] will work on her home pump.  Both babies took 20 ml at last feeding.  Mom aware of importance of increasing supplement amounts every day.  Encouraged her to call for assistance as needed.     Judee ClaraSmith, Johnanthony Wilden E 11/24/2015, 1:46 PM

## 2015-11-25 NOTE — Progress Notes (Signed)
Subjective: Postpartum Day #2: Cesarean Delivery Patient reports incisional pain, tolerating PO and no problems voiding.    Objective: Vital signs in last 24 hours: Temp:  [97.6 F (36.4 C)-98.6 F (37 C)] 97.9 F (36.6 C) (05/19 0531) Pulse Rate:  [79-90] 79 (05/19 0531) Resp:  [16-20] 16 (05/19 0531) BP: (105-136)/(75-88) 136/75 mmHg (05/19 0531)  Physical Exam:  General: alert, cooperative and no distress Lochia: appropriate Uterine Fundus: firm Incision: no significant drainage DVT Evaluation: No evidence of DVT seen on physical exam. No cords or calf tenderness. No significant calf/ankle edema.   Recent Labs  11/23/15 0928 11/24/15 0639  HGB 12.1 8.8*  HCT 35.6* 26.3*    Assessment/Plan: Status post Cesarean section. Doing well postoperatively.  Continue current care.  Roe Coombsachelle A Kylan Liberati, CNM 11/25/2015, 9:07 AM

## 2015-11-26 MED ORDER — POLYSACCHARIDE IRON COMPLEX 150 MG PO CAPS: 150.0000 mg | ORAL_CAPSULE | Freq: Every day | ORAL | Status: DC

## 2015-11-26 MED ORDER — OXYCODONE-ACETAMINOPHEN 5-325 MG PO TABS: 1.0000 | ORAL_TABLET | ORAL | Status: DC | PRN

## 2015-11-26 MED ORDER — IBUPROFEN 600 MG PO TABS: 600.0000 mg | ORAL_TABLET | Freq: Four times a day (QID) | ORAL | Status: DC | PRN

## 2015-11-26 NOTE — Discharge Summary (Signed)
Obstetric Discharge Summary Reason for Admission: cesarean section Prenatal Procedures: NST and ultrasound Intrapartum Procedures: cesarean: low cervical, transverse Postpartum Procedures: none Complications-Operative and Postpartum: none HEMOGLOBIN  Date Value Ref Range Status  11/24/2015 8.8* 12.0 - 15.0 g/dL Final    Comment:    REPEATED TO VERIFY DELTA CHECK NOTED    HCT  Date Value Ref Range Status  11/24/2015 26.3* 36.0 - 46.0 % Final    Physical Exam:  General: alert and no distress Lochia: appropriate Uterine Fundus: firm Incision: healing well DVT Evaluation: No evidence of DVT seen on physical exam.  Discharge Diagnoses: Term Pregnancy-delivered  Discharge Information: Date: 11/26/2015 Activity: pelvic rest Diet: routine Medications: PNV, Ibuprofen, Colace, Iron and Percocet Condition: stable Instructions: refer to practice specific booklet Discharge to: Julia Follow-up Information    Follow up with Julia Fowler, Julia Fowler In 4 days.   Specialty:  Obstetrics and Gynecology   Why:  Removal of staples   Contact information:   [address] [place] [address] [phone]       Newborn Data:   Julia Fowler, GirlA Julia Fowler [[id number]][[id number]]  Live born female  Birth Weight: 6 lb 2.9 oz (2805 g) APGAR: 3, 9   Julia Fowler, BoyB Julia Fowler [[id number]][[id number]]  Live born female  Birth Weight: 4 lb 11.1 oz (2130 g) APGAR: 9, 9  Julia with mother.  Julia Fowler 11/26/2015, 8:42 AM

## 2015-11-26 NOTE — Lactation Note (Signed)
This note was copied from a baby's chart. Lactation Consultation Note  Patient Name: Wayna ChaletBoyB Sonja Sudbury ZOXWR'UToday's Date: 11/26/2015   Babies 70 hrs old on day of discharge.  Mom bottle feeding formula and pumping.  Has a Medela PIS at home.  Recommended pumping >8 times in 24 hrs, and placing babies skin to skin as much as possible.  If she would like help latching babies, she can call for OP Lactation appointment.  Engorgement treatment discussed.  Encouraged Mom to call prn.  Judee ClaraSmith, Akacia Boltz E 11/26/2015, 10:16 AM

## 2015-11-26 NOTE — Progress Notes (Signed)
Subjective: Postpartum Day 3: Cesarean Delivery Patient reports tolerating PO, + flatus, + BM and no problems voiding.    Objective: Vital signs in last 24 hours: Temp:  [98.1 F (36.7 C)-98.2 F (36.8 C)] 98.2 F (36.8 C) (05/20 0603) Pulse Rate:  [70-94] 70 (05/20 0603) Resp:  [18] 18 (05/20 0603) BP: (134-136)/(75-76) 136/75 mmHg (05/20 0603)  Physical Exam:  General: alert and no distress Lochia: appropriate Uterine Fundus: firm Incision: healing well DVT Evaluation: No evidence of DVT seen on physical exam.   Recent Labs  11/23/15 0928 11/24/15 0639  HGB 12.1 8.8*  HCT 35.6* 26.3*    Assessment/Plan: Status post Cesarean section. Doing well postoperatively.  Discharge home with standard precautions and return to clinic in 2 weeks.  HARPER,CHARLES A 11/26/2015, 8:32 AM

## 2015-11-27 ENCOUNTER — Encounter (HOSPITAL_COMMUNITY): Payer: Self-pay

## 2015-11-27 ENCOUNTER — Inpatient Hospital Stay (HOSPITAL_COMMUNITY)
Admission: AD | Admit: 2015-11-27 | Discharge: 2015-11-28 | Disposition: A | Discharge status: Home / Self Care | Payer: Medicaid Other | Source: Ambulatory Visit | Attending: Obstetrics | Admitting: Obstetrics

## 2015-11-27 DIAGNOSIS — E039 Hypothyroidism, unspecified: Secondary | ICD-10-CM | POA: Diagnosis not present

## 2015-11-27 DIAGNOSIS — G8918 Other acute postprocedural pain: Secondary | ICD-10-CM | POA: Diagnosis not present

## 2015-11-27 DIAGNOSIS — R030 Elevated blood-pressure reading, without diagnosis of hypertension: Secondary | ICD-10-CM | POA: Insufficient documentation

## 2015-11-27 LAB — TYPE AND SCREEN
ABO/RH(D): A POS
ANTIBODY SCREEN: NEGATIVE
Unit division: 0
Unit division: 0

## 2015-11-27 NOTE — MAU Note (Signed)
Pt reports she is s/p c-section on 11/23/2015 and the drainage coming from her incision is green and she has a rash all over her abd.

## 2015-11-28 ENCOUNTER — Encounter: Payer: Self-pay | Admitting: Obstetrics

## 2015-11-28 ENCOUNTER — Other Ambulatory Visit: Payer: Self-pay | Admitting: Obstetrics

## 2015-11-28 ENCOUNTER — Ambulatory Visit (INDEPENDENT_AMBULATORY_CARE_PROVIDER_SITE_OTHER): Payer: Medicaid Other | Admitting: Obstetrics

## 2015-11-28 DIAGNOSIS — Z9889 Other specified postprocedural states: Secondary | ICD-10-CM

## 2015-11-28 DIAGNOSIS — IMO0001 Reserved for inherently not codable concepts without codable children: Secondary | ICD-10-CM

## 2015-11-28 DIAGNOSIS — T814XXA Infection following a procedure, initial encounter: Secondary | ICD-10-CM

## 2015-11-28 LAB — CBC
HEMATOCRIT: 27.6 % — AB (ref 36.0–46.0)
HEMOGLOBIN: 9.1 g/dL — AB (ref 12.0–15.0)
MCH: 31.1 pg (ref 26.0–34.0)
MCHC: 33 g/dL (ref 30.0–36.0)
MCV: 94.2 fL (ref 78.0–100.0)
Platelets: 310 10*3/uL (ref 150–400)
RBC: 2.93 MIL/uL — AB (ref 3.87–5.11)
RDW: 14.4 % (ref 11.5–15.5)
WBC: 5.8 10*3/uL (ref 4.0–10.5)

## 2015-11-28 LAB — COMPREHENSIVE METABOLIC PANEL
ALT: 31 U/L (ref 14–54)
AST: 33 U/L (ref 15–41)
Albumin: 2.7 g/dL — ABNORMAL LOW (ref 3.5–5.0)
Alkaline Phosphatase: 99 U/L (ref 38–126)
Anion gap: 6 (ref 5–15)
BILIRUBIN TOTAL: 0.5 mg/dL (ref 0.3–1.2)
BUN: 21 mg/dL — AB (ref 6–20)
CO2: 27 mmol/L (ref 22–32)
CREATININE: 0.73 mg/dL (ref 0.44–1.00)
Calcium: 8.4 mg/dL — ABNORMAL LOW (ref 8.9–10.3)
Chloride: 105 mmol/L (ref 101–111)
GFR calc Af Amer: >60 mL/min (ref >60)
Glucose, Bld: 94 mg/dL (ref 65–99)
POTASSIUM: 3.8 mmol/L (ref 3.5–5.1)
Sodium: 138 mmol/L (ref 135–145)
TOTAL PROTEIN: 6.1 g/dL — AB (ref 6.5–8.1)

## 2015-11-28 LAB — URINE MICROSCOPIC-ADD ON: WBC, UA: NONE SEEN WBC/hpf (ref 0–5)

## 2015-11-28 LAB — URINALYSIS, ROUTINE W REFLEX MICROSCOPIC
BILIRUBIN URINE: NEGATIVE
Glucose, UA: NEGATIVE mg/dL
Ketones, ur: NEGATIVE mg/dL
Leukocytes, UA: NEGATIVE
Nitrite: NEGATIVE
PROTEIN: 30 mg/dL — AB
Specific Gravity, Urine: 1.025 (ref 1.005–1.030)
pH: 6 (ref 5.0–8.0)

## 2015-11-28 MED ORDER — AMOXICILLIN-POT CLAVULANATE 875-125 MG PO TABS
1.0000 | ORAL_TABLET | Freq: Once | ORAL | Status: AC
  Administered 2015-11-28: 1 via ORAL
  Filled 2015-11-28: qty 1

## 2015-11-28 MED ORDER — AMOXICILLIN-POT CLAVULANATE 875-125 MG PO TABS: 1.0000 | ORAL_TABLET | Freq: Two times a day (BID) | ORAL | Status: DC

## 2015-11-28 NOTE — MAU Provider Note (Signed)
History   [id number]   No chief complaint on file.   HPI Julia Fowler is a 30 y.o. female  [phone] here status post a repeat csection on 11/23/15.  Discharged home yesterday.  Pt reports having greenish discharge noticed from incision.  Also reports increased pain superior to incision.  Pain is rate a 7/10.  Last pain medicine 5 hours ago.  Drove self to the hospital.  Denies fever, body aches or chills.  No reported problems with bowel or bladder.  No report of nausea or vomiting.    Reports elevated blood pressure towards the end of pregnancy.  Denies headache or epigastric pain.  +"floaters" reported.    No LMP recorded.  OB History  Gravida Para Term Preterm AB SAB TAB Ectopic Multiple Living  4 3 3  0 1 0 1 0 1 4    # Outcome Date GA Lbr Len/2nd Weight Sex Delivery Anes PTL Lv  4A Term 11/23/15 [redacted]w[redacted]d  6 lb 2.9 oz (2.805 kg) F CS-LTranv Spinal  Y     Comments: No gross abnormalities seen.  4B Term 11/23/15 [redacted]w[redacted]d  4 lb 11.1 oz (2.13 kg) M CS-LTranv Spinal  Y     Comments: No gross abnormalities seen.  3 Term 01/21/14 [redacted]w[redacted]d  7 lb 8.1 oz (3.405 kg) F CS-LTranv Spinal  Y  2 TAB 06/27/09 [redacted]w[redacted]d     Other  N     Comments: abortion  1 Term 10/20/00 [redacted]w[redacted]d  7 lb 7 oz (3.374 kg) M CS-Unspec EPI N Y     Comments: Did not progress past 3 cm      Past Medical History  Diagnosis Date  . Rickets   . Thyroid disease hypothyroid  . Seasonal allergies   . Hypothyroidism     no medication    Family History  Problem Relation Age of Onset  . Cancer Sister     breast  . Diabetes Maternal Grandmother     Social History   Social History  . Marital Status: Single    Spouse Name: N/A  . Number of Children: N/A  . Years of Education: N/A   Social History Main Topics  . Smoking status: Never Smoker   . Smokeless tobacco: Never Used  . Alcohol Use: No  . Drug Use: No  . Sexual Activity:    Partners: Male    Birth Control/ Protection:    Other Topics Concern  . None   Social  History Narrative    Allergies  Allergen Reactions  . Other Other (See Comments) and Cough    Pt states that she is allergic to cats/dogs.   Reaction:  Itchy, watery eyes     No current facility-administered medications on file prior to encounter.   Current Outpatient Prescriptions on File Prior to Encounter  Medication Sig Dispense Refill  . calcium carbonate (TUMS - DOSED IN MG ELEMENTAL CALCIUM) 500 MG chewable tablet Chew 1 tablet by mouth daily as needed for indigestion or heartburn.     Marland Kitchen ibuprofen (ADVIL,MOTRIN) 600 MG tablet Take 1 tablet (600 mg total) by mouth every 6 (six) hours as needed for mild pain. 30 tablet 5  . iron polysaccharides (NIFEREX) 150 MG capsule Take 1 capsule (150 mg total) by mouth daily. 30 capsule 5  . omeprazole (PRILOSEC) 20 MG capsule Take 1 capsule (20 mg total) by mouth 2 (two) times daily before a meal. 60 capsule 5  . ondansetron (ZOFRAN) 8 MG tablet Take 1 tablet (  8 mg total) by mouth every 8 (eight) hours as needed for nausea or vomiting. 60 tablet 1  . oxyCODONE-acetaminophen (PERCOCET/ROXICET) 5-325 MG tablet Take 1-2 tablets by mouth every 4 (four) hours as needed for moderate pain or severe pain (pain scale > 7). 40 tablet 0  . Prenat-FeCbn-FeAspGl-FA-Omega (OB COMPLETE PETITE) 35-5-1-200 MG CAPS Take 1 capsule by mouth at bedtime.    Marland Kitchen. zolpidem (AMBIEN) 5 MG tablet Take 1 tablet (5 mg total) by mouth at bedtime as needed for sleep. 30 tablet 0     Review of Systems  Constitutional: Negative for fever and chills.  Eyes: Positive for visual disturbance. Negative for photophobia.  Gastrointestinal: Positive for abdominal pain (incisional pain). Negative for nausea, vomiting, diarrhea and constipation.  Genitourinary: Positive for vaginal bleeding and pelvic pain. Negative for dysuria and vaginal discharge.  Neurological: Negative for headaches.  All other systems reviewed and are negative.    Physical Exam   Filed Vitals:   11/27/15  2347 11/28/15 0003 11/28/15 0015  BP: 151/101 150/87 145/109  Pulse: 87 91 91  Temp: 98.2 F (36.8 C)    TempSrc: Oral    Resp: 20    Height: 4\' 11"  (1.499 m)    Weight: 203 lb (92.08 kg)    SpO2: 98%     Filed Vitals:   11/28/15 0003 11/28/15 0015 11/28/15 0037 11/28/15 0045  BP: 150/87 145/109 156/91 150/91  Pulse: 91 91 86 85  Temp:      TempSrc:      Resp:      Height:      Weight:      SpO2:        Physical Exam  Constitutional: She is oriented to person, place, and time. She appears well-developed and well-nourished.  HENT:  Head: Normocephalic.  Neck: Normal range of motion. Neck supple.  Cardiovascular: Normal rate, regular rhythm and normal heart sounds.   Respiratory: Effort normal and breath sounds normal. No respiratory distress.  GI: Soft. There is no tenderness. There is no guarding.  Dressing removed; staples in place; Incision site healing well; well approximated, no signs of infection; small dark green discharge noted at one area on dressing; no abnormal discharge seen coming from wound;  Tissue superior to incision site palpates moderately hard and is tender with palpation.    Genitourinary: No bleeding in the vagina.  Musculoskeletal: Normal range of motion. She exhibits no edema.  Neurological: She is alert and oriented to person, place, and time. She has normal reflexes.  Skin: Skin is warm and dry.    MAU Course  Procedures  0045 Called Dr. Clearance CootsHarper > Reviewed HPI/Exam > call with lab results > 0110 called with lab results > discharge home with follow-up in office in AM  Results for orders placed or performed during the hospital encounter of 11/27/15 (from the past 24 hour(s))  CBC     Status: Abnormal   Collection Time: 11/28/15 12:23 AM  Result Value Ref Range   WBC 5.8 4.0 - 10.5 K/uL   RBC 2.93 (L) 3.87 - 5.11 MIL/uL   Hemoglobin 9.1 (L) 12.0 - 15.0 g/dL   HCT 40.927.6 (L) 81.136.0 - 91.446.0 %   MCV 94.2 78.0 - 100.0 fL   MCH 31.1 26.0 - 34.0 pg    MCHC 33.0 30.0 - 36.0 g/dL   RDW 78.214.4 95.611.5 - 21.315.5 %   Platelets 310 150 - 400 K/uL  Comprehensive metabolic panel     Status: Abnormal  Collection Time: 11/28/15 12:23 AM  Result Value Ref Range   Sodium 138 135 - 145 mmol/L   Potassium 3.8 3.5 - 5.1 mmol/L   Chloride 105 101 - 111 mmol/L   CO2 27 22 - 32 mmol/L   Glucose, Bld 94 65 - 99 mg/dL   BUN 21 (H) 6 - 20 mg/dL   Creatinine, Ser 1.61 0.44 - 1.00 mg/dL   Calcium 8.4 (L) 8.9 - 10.3 mg/dL   Total Protein 6.1 (L) 6.5 - 8.1 g/dL   Albumin 2.7 (L) 3.5 - 5.0 g/dL   AST 33 15 - 41 U/L   ALT 31 14 - 54 U/L   Alkaline Phosphatase 99 38 - 126 U/L   Total Bilirubin 0.5 0.3 - 1.2 mg/dL   GFR calc non Af Amer >60 >60 mL/min   GFR calc Af Amer >60 >60 mL/min   Anion gap 6 5 - 15     Assessment and Plan  Post-op Pain Elevated Blood Pressure - normal labs  Plan: Discharge home RX Augmentin BID Follow-up in office today for staple removal and reassessment   Marlis Edelson, CNM 11/28/2015 1:15 AM

## 2015-11-28 NOTE — MAU Note (Signed)
Urine sent to lab 

## 2015-11-29 ENCOUNTER — Encounter: Payer: Self-pay | Admitting: Obstetrics

## 2015-11-29 NOTE — Progress Notes (Signed)
S:  Patient is 5 days post op LTCS for twins.  Presents for removal of staples.  Complains of incisional pain, mainly above incision. O:  Afebrile, VSS       General:  Alert and no distress       Breasts:  Soft and non tender       Abdomen:  Soft, tender and indurated area ~ 5 cm above incision c/w possible cellulitis.                          Incision C, D, I.  Staples removed and steri strips applied       Extremities:  No C, C, E. A:   Doing well.  Possible early cellulitis above incision.  Will start po Augmentin bid. P:   F/U in 2 weeks.  Charles A. Clearance CootsHarper MD. 11-28-2015

## 2015-11-30 ENCOUNTER — Ambulatory Visit: Payer: Medicaid Other | Admitting: Obstetrics

## 2015-12-08 ENCOUNTER — Telehealth: Payer: Self-pay | Admitting: *Deleted

## 2015-12-08 NOTE — Telephone Encounter (Signed)
Home health nurse called after doing home visit- patient's BP is elevated- 150/100. She is having some intermittent symptoms- headache off/on, nausea. Call forwarded by Barb-not listened to until after hours. Attempted to call patient-LM on VM- need to go to MAU for evaluation of BP. With patient history- she needs evaluation.

## 2015-12-09 ENCOUNTER — Encounter (HOSPITAL_COMMUNITY): Payer: Self-pay | Admitting: *Deleted

## 2015-12-09 ENCOUNTER — Inpatient Hospital Stay (HOSPITAL_COMMUNITY)
Admission: AD | Admit: 2015-12-09 | Discharge: 2015-12-12 | DRG: 776 | Disposition: A | Discharge status: Home / Self Care | Payer: Medicaid Other | Source: Ambulatory Visit | Attending: Obstetrics | Admitting: Obstetrics

## 2015-12-09 ENCOUNTER — Telehealth: Payer: Self-pay | Admitting: *Deleted

## 2015-12-09 DIAGNOSIS — E039 Hypothyroidism, unspecified: Secondary | ICD-10-CM | POA: Diagnosis present

## 2015-12-09 DIAGNOSIS — O1495 Unspecified pre-eclampsia, complicating the puerperium: Secondary | ICD-10-CM | POA: Diagnosis not present

## 2015-12-09 DIAGNOSIS — R51 Headache: Secondary | ICD-10-CM | POA: Diagnosis not present

## 2015-12-09 DIAGNOSIS — Z91048 Other nonmedicinal substance allergy status: Secondary | ICD-10-CM | POA: Diagnosis not present

## 2015-12-09 DIAGNOSIS — O8620 Urinary tract infection following delivery, unspecified: Secondary | ICD-10-CM

## 2015-12-09 LAB — DIFFERENTIAL
BASOS PCT: 1 %
Basophils Absolute: 0.1 10*3/uL (ref 0.0–0.1)
EOS ABS: 0.3 10*3/uL (ref 0.0–0.7)
EOS PCT: 5 %
Lymphocytes Relative: 30 %
Lymphs Abs: 1.5 10*3/uL (ref 0.7–4.0)
MONO ABS: 0.3 10*3/uL (ref 0.1–1.0)
MONOS PCT: 5 %
Neutro Abs: 3.1 10*3/uL (ref 1.7–7.7)
Neutrophils Relative %: 59 %

## 2015-12-09 LAB — URINALYSIS, ROUTINE W REFLEX MICROSCOPIC
BILIRUBIN URINE: NEGATIVE
GLUCOSE, UA: NEGATIVE mg/dL
Glucose, UA: NEGATIVE mg/dL
KETONES UR: NEGATIVE mg/dL
KETONES UR: NEGATIVE mg/dL
Leukocytes, UA: NEGATIVE
NITRITE: POSITIVE — AB
NITRITE: NEGATIVE
PH: 5.5 (ref 5.0–8.0)
PROTEIN: NEGATIVE mg/dL
Protein, ur: 100 mg/dL — AB
SPECIFIC GRAVITY, URINE: 1.025 (ref 1.005–1.030)
Specific Gravity, Urine: 1.02 (ref 1.005–1.030)
pH: 5.5 (ref 5.0–8.0)

## 2015-12-09 LAB — COMPREHENSIVE METABOLIC PANEL
ALBUMIN: 3.4 g/dL — AB (ref 3.5–5.0)
ALT: 11 U/L — AB (ref 14–54)
ANION GAP: 7 (ref 5–15)
AST: 21 U/L (ref 15–41)
Alkaline Phosphatase: 90 U/L (ref 38–126)
BILIRUBIN TOTAL: 0.6 mg/dL (ref 0.3–1.2)
BUN: 16 mg/dL (ref 6–20)
CO2: 25 mmol/L (ref 22–32)
Calcium: 8.4 mg/dL — ABNORMAL LOW (ref 8.9–10.3)
Chloride: 106 mmol/L (ref 101–111)
Creatinine, Ser: 0.67 mg/dL (ref 0.44–1.00)
Glucose, Bld: 97 mg/dL (ref 65–99)
POTASSIUM: 3.4 mmol/L — AB (ref 3.5–5.1)
SODIUM: 138 mmol/L (ref 135–145)
TOTAL PROTEIN: 7.2 g/dL (ref 6.5–8.1)

## 2015-12-09 LAB — CBC
HCT: 33.2 % — ABNORMAL LOW (ref 36.0–46.0)
HEMOGLOBIN: 10.9 g/dL — AB (ref 12.0–15.0)
MCH: 29 pg (ref 26.0–34.0)
MCHC: 32.8 g/dL (ref 30.0–36.0)
MCV: 88.3 fL (ref 78.0–100.0)
PLATELETS: 375 10*3/uL (ref 150–400)
RBC: 3.76 MIL/uL — AB (ref 3.87–5.11)
RDW: 13.1 % (ref 11.5–15.5)
WBC: 5.3 10*3/uL (ref 4.0–10.5)

## 2015-12-09 LAB — URINE MICROSCOPIC-ADD ON

## 2015-12-09 LAB — PROTEIN / CREATININE RATIO, URINE
CREATININE, URINE: 190 mg/dL
CREATININE, URINE: 179 mg/dL
PROTEIN CREATININE RATIO: 0.92 mg/mg{creat} — AB (ref 0.00–0.15)
PROTEIN CREATININE RATIO: 0.09 mg/mg{creat} (ref 0.00–0.15)
Total Protein, Urine: 175 mg/dL
Total Protein, Urine: 16 mg/dL

## 2015-12-09 MED ORDER — PRENATAL MULTIVITAMIN CH
1.0000 | ORAL_TABLET | Freq: Every day | ORAL | Status: DC
  Administered 2015-12-10 – 2015-12-12 (×3): 1 via ORAL
  Filled 2015-12-09 (×3): qty 1

## 2015-12-09 MED ORDER — DOCUSATE SODIUM 100 MG PO CAPS
100.0000 mg | ORAL_CAPSULE | Freq: Every day | ORAL | Status: DC
  Administered 2015-12-10 – 2015-12-12 (×3): 100 mg via ORAL
  Filled 2015-12-09 (×3): qty 1

## 2015-12-09 MED ORDER — LACTATED RINGERS IV SOLN: INTRAVENOUS | Status: DC

## 2015-12-09 MED ORDER — SODIUM CHLORIDE 0.9 % IV SOLN: INTRAVENOUS | Status: DC

## 2015-12-09 MED ORDER — DEXTROSE 5 % IV SOLN
2.0000 g | INTRAVENOUS | Status: DC
  Filled 2015-12-09: qty 2

## 2015-12-09 MED ORDER — OXYCODONE-ACETAMINOPHEN 5-325 MG PO TABS
2.0000 | ORAL_TABLET | Freq: Once | ORAL | Status: AC
  Administered 2015-12-09: 2 via ORAL
  Filled 2015-12-09: qty 2

## 2015-12-09 MED ORDER — MAGNESIUM SULFATE BOLUS VIA INFUSION
4.0000 g | Freq: Once | INTRAVENOUS | Status: AC
  Administered 2015-12-09: 4 g via INTRAVENOUS
  Filled 2015-12-09: qty 500

## 2015-12-09 MED ORDER — CALCIUM CARBONATE ANTACID 500 MG PO CHEW: 2.0000 | CHEWABLE_TABLET | ORAL | Status: DC | PRN

## 2015-12-09 MED ORDER — MAGNESIUM SULFATE 50 % IJ SOLN
2.0000 g/h | INTRAMUSCULAR | Status: DC
Start: 2015-12-09 — End: 2015-12-12
  Administered 2015-12-09 – 2015-12-10 (×2): 2 g/h via INTRAVENOUS
  Filled 2015-12-09 (×2): qty 80

## 2015-12-09 MED ORDER — PROCHLORPERAZINE EDISYLATE 5 MG/ML IJ SOLN
10.0000 mg | Freq: Once | INTRAMUSCULAR | Status: DC
  Filled 2015-12-09: qty 2

## 2015-12-09 MED ORDER — ZOLPIDEM TARTRATE 5 MG PO TABS: 5.0000 mg | ORAL_TABLET | Freq: Every evening | ORAL | Status: DC | PRN

## 2015-12-09 MED ORDER — IBUPROFEN 600 MG PO TABS
600.0000 mg | ORAL_TABLET | Freq: Four times a day (QID) | ORAL | Status: DC | PRN
  Administered 2015-12-12: 600 mg via ORAL
  Filled 2015-12-09: qty 1

## 2015-12-09 MED ORDER — LACTATED RINGERS IV BOLUS (SEPSIS): 1000.0000 mL | Freq: Once | INTRAVENOUS | Status: DC

## 2015-12-09 MED ORDER — DEXAMETHASONE SODIUM PHOSPHATE 10 MG/ML IJ SOLN: 10.0000 mg | Freq: Once | INTRAMUSCULAR | Status: DC

## 2015-12-09 MED ORDER — ACETAMINOPHEN 325 MG PO TABS: 650.0000 mg | ORAL_TABLET | ORAL | Status: DC | PRN

## 2015-12-09 MED ORDER — OXYCODONE-ACETAMINOPHEN 5-325 MG PO TABS
1.0000 | ORAL_TABLET | ORAL | Status: DC | PRN
  Administered 2015-12-10 (×4): 2 via ORAL
  Administered 2015-12-11: 1 via ORAL
  Administered 2015-12-11 (×2): 2 via ORAL
  Administered 2015-12-12: 1 via ORAL
  Filled 2015-12-09: qty 2
  Filled 2015-12-09: qty 1
  Filled 2015-12-09 (×3): qty 2
  Filled 2015-12-09: qty 1
  Filled 2015-12-09 (×2): qty 2

## 2015-12-09 MED ORDER — DIPHENHYDRAMINE HCL 50 MG/ML IJ SOLN: 25.0000 mg | Freq: Once | INTRAMUSCULAR | Status: DC

## 2015-12-09 MED ORDER — CEPHALEXIN 500 MG PO CAPS
500.0000 mg | ORAL_CAPSULE | Freq: Four times a day (QID) | ORAL | Status: DC
  Administered 2015-12-10 – 2015-12-11 (×5): 500 mg via ORAL
  Filled 2015-12-09 (×7): qty 1

## 2015-12-09 NOTE — Telephone Encounter (Signed)
Pt called to office returning a call.  Return call to pt. Pt states that she spoke with Dr Julia Fowler earlier today.  [facility]. Pt states that she is waiting on her boyfriend to get home to take her.

## 2015-12-09 NOTE — MAU Note (Signed)
Patient presents with c/o high blood pressure, headache and seeing spots. C/S on 5/17 with gestation. Home Care Nurse checked her BP and was 150/100

## 2015-12-09 NOTE — MAU Provider Note (Signed)
Chief Complaint: Hypertension and Headache   First Provider Initiated Contact with Patient 12/09/15 1808     SUBJECTIVE HPI: Julia Fowler is a 30 y.o. [id number] at Unknown who presents to Maternity Admissions reporting headache and seeing spots.  Past Medical History  Diagnosis Date  . Rickets   . Thyroid disease hypothyroid  . Seasonal allergies   . Hypothyroidism     no medication   OB History  Gravida Para Term Preterm AB SAB TAB Ectopic Multiple Living  0 1 0 1 0 1 4    # Outcome Date GA Lbr Len/2nd Weight Sex Delivery Anes PTL Lv  4A Term 11/23/15 [redacted]w[redacted]d  6 lb 2.9 oz (2.805 kg) F CS-LTranv Spinal  Y     Comments: No gross abnormalities seen.  4B Term 11/23/15 [redacted]w[redacted]d  4 lb 11.1 oz (2.13 kg) M CS-LTranv Spinal  Y     Comments: No gross abnormalities seen.  3 Term 01/21/14 [redacted]w[redacted]d  7 lb 8.1 oz (3.405 kg) F CS-LTranv Spinal  Y  2 TAB 06/27/09 [redacted]w[redacted]d     Other  N     Comments: abortion  1 Term 10/20/00 [redacted]w[redacted]d  7 lb 7 oz (3.374 kg) M CS-Unspec EPI N Y     Comments: Did not progress past 3 cm     Past Surgical History  Procedure Laterality Date  . Cesarean section    . Tooth extraction Left   . Cesarean section N/A 01/21/2014    Procedure: REPEAT CESAREAN SECTION;  Surgeon: Brock Bad, MD;  Location: [facility];  Service: Obstetrics;  Laterality: N/A;  . Cesarean section multi-gestational N/A 11/23/2015    Procedure: CESAREAN SECTION MULTI-GESTATIONAL/TWINS;  Surgeon: Brock Bad, MD;  Location: [facility] BIRTHING SUITES;  Service: Obstetrics;  Laterality: N/A;   Social History   Social History  . Marital Status: Single    Spouse Name: N/A  . Number of Children: N/A  . Years of Education: N/A   Occupational History  . Not on file.   Social History Main Topics  . Smoking status: Never Smoker   . Smokeless tobacco: Never Used  . Alcohol Use: No  . Drug Use: No  . Sexual Activity:    Partners: Male    Birth Control/ Protection:    Other Topics Concern  . Not on  file   Social History Narrative   No current facility-administered medications on file prior to encounter.   Current Outpatient Prescriptions on File Prior to Encounter  Medication Sig Dispense Refill  . iron polysaccharides (NIFEREX) 150 MG capsule Take 1 capsule (150 mg total) by mouth daily. 30 capsule 5  . Prenat-FeCbn-FeAspGl-FA-Omega (OB COMPLETE PETITE) 35-5-1-200 MG CAPS Take 1 capsule by mouth at bedtime.    Marland Kitchen amoxicillin-clavulanate (AUGMENTIN) 875-125 MG tablet Take 1 tablet by mouth 2 (two) times daily. (Patient not taking: Reported on 12/09/2015) 13 tablet 0  . ibuprofen (ADVIL,MOTRIN) 600 MG tablet Take 1 tablet (600 mg total) by mouth every 6 (six) hours as needed for mild pain. (Patient not taking: Reported on 12/09/2015) 30 tablet 5  . oxyCODONE-acetaminophen (PERCOCET/ROXICET) 5-325 MG tablet Take 1-2 tablets by mouth every 4 (four) hours as needed for moderate pain or severe pain (pain scale > 7). (Patient not taking: Reported on 12/09/2015) 40 tablet 0   Allergies  Allergen Reactions  . Other Other (See Comments) and Cough    Pt states that she is allergic to cats/dogs.   Reaction:  Itchy,  watery eyes     I have reviewed the past Medical Hx, Surgical Hx, Social Hx, Allergies and Medications.   Review of Systems  OBJECTIVE Patient Vitals for the past 24 hrs:  BP Pulse Resp Height Weight  12/09/15 2046 127/77 mmHg 80 - - -  12/09/15 2016 153/88 mmHg 85 - - -  12/09/15 2000 (!) 166/120 mmHg 94 - - -  12/09/15 1945 150/99 mmHg 88 - - -  12/09/15 1932 135/91 mmHg - - - -  12/09/15 1929 158/98 mmHg 80 - - -  12/09/15 1900 (!) 155/107 mmHg 84 - - -  12/09/15 1845 159/95 mmHg 84 - - -  12/09/15 1830 139/85 mmHg 77 - - -  12/09/15 1815 138/93 mmHg 94 - - -  12/09/15 1801 112/79 mmHg (!) 125 - - -  12/09/15 1745 138/94 mmHg 76 - - -  12/09/15 1731 148/94 mmHg 84 - - -  12/09/15 1715 125/93 mmHg 85 - - -  12/09/15 1700 117/82 mmHg 89 - - -  12/09/15 1655 126/80 mmHg 88 -  - -  12/09/15 1636 141/85 mmHg 89 18 4\' 11"  (1.499 m) 192 lb (87.091 kg)   Constitutional: Well-developed, well-nourished female in no acute distress.  Cardiovascular: normal rate Respiratory: normal rate and effort.  GI: Abd soft, non-tender, gravid appropriate for gestational age. Pos BS x 4 MS: Extremities nontender, no edema, normal ROM Neurologic: Alert and oriented x 4.  GU: Neg CVAT.  SPECULUM EXAM: NEFG, physiologic discharge, no blood noted, cervix clean  BIMANUAL: cervix long and closed; uterus normal size, no adnexal tenderness or masses.  No CMT.  LAB RESULTS Results for orders placed or performed during the hospital encounter of 12/09/15 (from the past 24 hour(s))  Urinalysis, Routine w reflex microscopic (not at [facility])     Status: Abnormal   Collection Time: 12/09/15  4:40 PM  Result Value Ref Range   Color, Urine AMBER (A) YELLOW   APPearance CLOUDY (A) CLEAR   Specific Gravity, Urine 1.025 1.005 - 1.030   pH 5.5 5.0 - 8.0   Glucose, UA NEGATIVE NEGATIVE mg/dL   Hgb urine dipstick LARGE (A) NEGATIVE   Bilirubin Urine SMALL (A) NEGATIVE   Ketones, ur NEGATIVE NEGATIVE mg/dL   Protein, ur 161 (A) NEGATIVE mg/dL   Nitrite POSITIVE (A) NEGATIVE   Leukocytes, UA TRACE (A) NEGATIVE  Protein / creatinine ratio, urine     Status: Abnormal   Collection Time: 12/09/15  4:40 PM  Result Value Ref Range   Creatinine, Urine 190.00 mg/dL   Total Protein, Urine 175 mg/dL   Protein Creatinine Ratio 0.92 (H) 0.00 - 0.15 mg/mg[Cre]  Urine microscopic-add on     Status: Abnormal   Collection Time: 12/09/15  4:40 PM  Result Value Ref Range   Squamous Epithelial / LPF 0-5 (A) NONE SEEN   WBC, UA 0-5 0 - 5 WBC/hpf   RBC / HPF TOO NUMEROUS TO COUNT 0 - 5 RBC/hpf   Bacteria, UA FEW (A) NONE SEEN  CBC     Status: Abnormal   Collection Time: 12/09/15  5:16 PM  Result Value Ref Range   WBC 5.3 4.0 - 10.5 K/uL   RBC 3.76 (L) 3.87 - 5.11 MIL/uL   Hemoglobin 10.9 (L) 12.0 - 15.0 g/dL    HCT 09.6 (L) 04.5 - 46.0 %   MCV 88.3 78.0 - 100.0 fL   MCH 29.0 26.0 - 34.0 pg   MCHC 32.8 30.0 - 36.0  g/dL   RDW 16.1 09.6 - 04.5 %   Platelets 375 150 - 400 K/uL  Comprehensive metabolic panel     Status: Abnormal   Collection Time: 12/09/15  5:16 PM  Result Value Ref Range   Sodium 138 135 - 145 mmol/L   Potassium 3.4 (L) 3.5 - 5.1 mmol/L   Chloride 106 101 - 111 mmol/L   CO2 25 22 - 32 mmol/L   Glucose, Bld 97 65 - 99 mg/dL   BUN 16 6 - 20 mg/dL   Creatinine, Ser 4.09 0.44 - 1.00 mg/dL   Calcium 8.4 (L) 8.9 - 10.3 mg/dL   Total Protein 7.2 6.5 - 8.1 g/dL   Albumin 3.4 (L) 3.5 - 5.0 g/dL   AST 21 15 - 41 U/L   ALT 11 (L) 14 - 54 U/L   Alkaline Phosphatase 90 38 - 126 U/L   Total Bilirubin 0.6 0.3 - 1.2 mg/dL   GFR calc non Af Amer >60 >60 mL/min   GFR calc Af Amer >60 >60 mL/min   Anion gap 7 5 - 15  Differential     Status: None   Collection Time: 12/09/15  5:16 PM  Result Value Ref Range   Neutrophils Relative % 59 %   Neutro Abs 3.1 1.7 - 7.7 K/uL   Lymphocytes Relative 30 %   Lymphs Abs 1.5 0.7 - 4.0 K/uL   Monocytes Relative 5 %   Monocytes Absolute 0.3 0.1 - 1.0 K/uL   Eosinophils Relative 5 %   Eosinophils Absolute 0.3 0.0 - 0.7 K/uL   Basophils Relative 1 %   Basophils Absolute 0.1 0.0 - 0.1 K/uL  Urinalysis, Routine w reflex microscopic (not at [facility])     Status: Abnormal   Collection Time: 12/09/15  7:15 PM  Result Value Ref Range   Color, Urine YELLOW YELLOW   APPearance CLEAR CLEAR   Specific Gravity, Urine 1.020 1.005 - 1.030   pH 5.5 5.0 - 8.0   Glucose, UA NEGATIVE NEGATIVE mg/dL   Hgb urine dipstick MODERATE (A) NEGATIVE   Bilirubin Urine NEGATIVE NEGATIVE   Ketones, ur NEGATIVE NEGATIVE mg/dL   Protein, ur NEGATIVE NEGATIVE mg/dL   Nitrite NEGATIVE NEGATIVE   Leukocytes, UA NEGATIVE NEGATIVE  Protein / creatinine ratio, urine     Status: None   Collection Time: 12/09/15  7:15 PM  Result Value Ref Range   Creatinine, Urine 179.00 mg/dL    Total Protein, Urine 16 mg/dL   Protein Creatinine Ratio 0.09 0.00 - 0.15 mg/mg[Cre]  Urine microscopic-add on     Status: Abnormal   Collection Time: 12/09/15  7:15 PM  Result Value Ref Range   Squamous Epithelial / LPF 0-5 (A) NONE SEEN   WBC, UA 0-5 0 - 5 WBC/hpf   RBC / HPF 0-5 0 - 5 RBC/hpf   Bacteria, UA RARE (A) NONE SEEN   Urine-Other MUCOUS PRESENT     IMAGING [place] Mfm Fetal Bpp Wo Non Stress  11/21/2015  OBSTETRICAL ULTRASOUND: This exam was performed within a [facility]ound Department. The OB US report was generated in the AS system, and faxed to the ordering physician.  This report is available in the [employer]. See the AS Obstetric US report via the Image Link.  [place] Mfm Fetal Bpp Wo Nst Addl Gestation  11/21/2015  OBSTETRICAL ULTRASOUND: This exam was performed within a  Ultrasound Department. The OB US report was generated in the AS system, and faxed to the  ordering physician.  This report is available in the [employer] [facility]. See the AS Obstetric US report via the Image Link.   MAU COURSE  MDM  ASSESSMENT 1. Preeclampsia in postpartum period   2. Postpartum UTI     PLAN Admit for magnesium sulfate Rocephin  Dorathy KinsmanVirginia Smith RN. 12/09/2015 11;30 PM

## 2015-12-10 ENCOUNTER — Encounter (HOSPITAL_COMMUNITY): Payer: Self-pay | Admitting: *Deleted

## 2015-12-10 MED ORDER — LABETALOL HCL 200 MG PO TABS
200.0000 mg | ORAL_TABLET | Freq: Three times a day (TID) | ORAL | Status: DC
  Administered 2015-12-10 – 2015-12-12 (×6): 200 mg via ORAL
  Filled 2015-12-10 (×7): qty 1

## 2015-12-10 MED ORDER — LACTATED RINGERS IV SOLN
INTRAVENOUS | Status: DC
  Administered 2015-12-09 – 2015-12-11 (×4): via INTRAVENOUS

## 2015-12-10 MED ORDER — PROMETHAZINE HCL 25 MG PO TABS
25.0000 mg | ORAL_TABLET | Freq: Four times a day (QID) | ORAL | Status: DC | PRN
  Administered 2015-12-10: 25 mg via ORAL
  Filled 2015-12-10: qty 1

## 2015-12-10 NOTE — Plan of Care (Signed)
Problem: Education: Goal: Knowledge of the prescribed therapeutic regimen will improve Outcome: Completed/Met Date Met:  12/10/15 Discussed Magnesium protocol with patient and reason for medication,how it works,signs and symptoms of toxicity.  Problem: Coping: Goal: Coping behaviors will improve Outcome: Completed/Met Date Met:  12/10/15 Patient has a good support system.

## 2015-12-10 NOTE — Plan of Care (Signed)
Problem: Pain Managment: Goal: General experience of comfort will improve Outcome: Completed/Met Date Met:  12/10/15 Good pain control on po Percocet.  Problem: Physical Regulation: Goal: Ability to maintain clinical measurements within normal limits will improve Outcome: Progressing Started on Magnesium drip.  Problem: Skin Integrity: Goal: Risk for impaired skin integrity will decrease Outcome: Completed/Met Date Met:  12/10/15 Ambulatory and able to take care of personal hygiene.  Problem: Tissue Perfusion: Goal: Risk factors for ineffective tissue perfusion will decrease Outcome: Completed/Met Date Met:  12/10/15 Ambulatory and SCD's in bed.  Problem: Activity: Goal: Risk for activity intolerance will decrease Outcome: Completed/Met Date Met:  12/10/15 Tolerates walking to bathroom well.  Problem: Fluid Volume: Goal: Ability to maintain a balanced intake and output will improve Outcome: Completed/Met Date Met:  12/10/15 Voids qs without difficulty.  Problem: Nutrition: Goal: Adequate nutrition will be maintained Outcome: Completed/Met Date Met:  12/10/15 Tolerates a regular diet well.  Problem: Bowel/Gastric: Goal: Will not experience complications related to bowel motility Outcome: Completed/Met Date Met:  12/10/15 Last BM on 12/08/15     

## 2015-12-10 NOTE — H&P (Signed)
Chief Complaint: Hypertension and Headache   First Provider Initiated Contact with Patient 12/09/15 1808     SUBJECTIVE HPI:  Julia Fowler is a 30 y.o. [id number] at Unknown who presents to Maternity Admissions reporting headache and seeing spots.  Past Medical History  Diagnosis Date  . Rickets   . Thyroid disease hypothyroid  . Seasonal allergies   . Hypothyroidism     no medication   OB History  Gravida Para Term Preterm AB SAB TAB Ectopic Multiple Living  4 3 3  0 1 0 1 0 1 4    # Outcome Date GA Lbr Len/2nd Weight Sex Delivery Anes PTL Lv  4A Term 11/23/15 10316w5d  6 lb 2.9 oz (2.805 kg) F CS-LTranv Spinal  Y     Comments: No gross abnormalities seen.  4B Term 11/23/15 3216w5d  4 lb 11.1 oz (2.13 kg) M CS-LTranv Spinal  Y     Comments: No gross abnormalities seen.  3 Term 01/21/14 8499w0d  7 lb 8.1 oz (3.405 kg) F CS-LTranv Spinal  Y  2 TAB 06/27/09 3229w0d     Other  N     Comments: abortion  1 Term 10/20/00 9211w0d  7 lb 7 oz (3.374 kg) M CS-Unspec EPI N Y     Comments: Did not progress past 3 cm     Past Surgical History  Procedure Laterality Date  . Cesarean section    . Tooth extraction Left   . Cesarean section N/A 01/21/2014    Procedure: REPEAT CESAREAN SECTION;  Surgeon: Brock Badharles A Harper, MD;  Location: [facility];  Service: Obstetrics;  Laterality: N/A;  . Cesarean section multi-gestational N/A 11/23/2015    Procedure: CESAREAN SECTION MULTI-GESTATIONAL/TWINS;  Surgeon: Brock Badharles A Harper, MD;  [facility];  Service: Obstetrics;  Laterality: N/A;   Social History   Social History  . Marital Status: Single    Spouse Name: N/A  . Number of Children: N/A  . Years of Education: N/A   Occupational History  . Not on file.   Social History Main Topics  . Smoking status: Never Smoker   . Smokeless tobacco: Never Used  . Alcohol Use: No  . Drug Use: No  . Sexual Activity:    Partners: Male    Birth Control/ Protection:    Other Topics Concern  . Not on  file   Social History Narrative   No current facility-administered medications on file prior to encounter.   Current Outpatient Prescriptions on File Prior to Encounter  Medication Sig Dispense Refill  . iron polysaccharides (NIFEREX) 150 MG capsule Take 1 capsule (150 mg total) by mouth daily. 30 capsule 5  . Prenat-FeCbn-FeAspGl-FA-Omega (OB COMPLETE PETITE) 35-5-1-200 MG CAPS Take 1 capsule by mouth at bedtime.    Marland Kitchen. amoxicillin-clavulanate (AUGMENTIN) 875-125 MG tablet Take 1 tablet by mouth 2 (two) times daily. (Patient not taking: Reported on 12/09/2015) 13 tablet 0  . ibuprofen (ADVIL,MOTRIN) 600 MG tablet Take 1 tablet (600 mg total) by mouth every 6 (six) hours as needed for mild pain. (Patient not taking: Reported on 12/09/2015) 30 tablet 5  . oxyCODONE-acetaminophen (PERCOCET/ROXICET) 5-325 MG tablet Take 1-2 tablets by mouth every 4 (four) hours as needed for moderate pain or severe pain (pain scale > 7). (Patient not taking: Reported on 12/09/2015) 40 tablet 0   Allergies  Allergen Reactions  . Other Other (See Comments) and Cough    Pt states that she is allergic to cats/dogs.   Reaction:  Itchy,  watery eyes     I have reviewed the past Medical Hx, Surgical Hx, Social Hx, Allergies and Medications.   Review of Systems  OBJECTIVE Patient Vitals for the past 24 hrs:  BP Pulse Resp Height Weight  12/09/15 2046 127/77 mmHg 80 - - -  12/09/15 2016 153/88 mmHg 85 - - -  12/09/15 2000 (!) 166/120 mmHg 94 - - -  12/09/15 1945 150/99 mmHg 88 - - -  12/09/15 1932 135/91 mmHg - - - -  12/09/15 1929 158/98 mmHg 80 - - -  12/09/15 1900 (!) 155/107 mmHg 84 - - -  12/09/15 1845 159/95 mmHg 84 - - -  12/09/15 1830 139/85 mmHg 77 - - -  12/09/15 1815 138/93 mmHg 94 - - -  12/09/15 1801 112/79 mmHg (!) 125 - - -  12/09/15 1745 138/94 mmHg 76 - - -  12/09/15 1731 148/94 mmHg 84 - - -  12/09/15 1715 125/93 mmHg 85 - - -  12/09/15 1700 117/82 mmHg 89 - - -  12/09/15 1655 126/80 mmHg 88 -  - -  12/09/15 1636 141/85 mmHg 89 18 4\' 11"  (1.499 m) 192 lb (87.091 kg)   Constitutional: Well-developed, well-nourished female in no acute distress.  Cardiovascular: normal rate Respiratory: normal rate and effort.  GI: Abd soft, non-tender, gravid appropriate for gestational age. Pos BS x 4 MS: Extremities nontender, no edema, normal ROM Neurologic: Alert and oriented x 4.  GU: Neg CVAT.  SPECULUM EXAM: NEFG, physiologic discharge, no blood noted, cervix clean  BIMANUAL: cervix long and closed; uterus normal size, no adnexal tenderness or masses.  No CMT.  LAB RESULTS Results for orders placed or performed during the hospital encounter of 12/09/15 (from the past 24 hour(s))  Urinalysis, Routine w reflex microscopic (not at [facility])     Status: Abnormal   Collection Time: 12/09/15  4:40 PM  Result Value Ref Range   Color, Urine AMBER (A) YELLOW   APPearance CLOUDY (A) CLEAR   Specific Gravity, Urine 1.025 1.005 - 1.030   pH 5.5 5.0 - 8.0   Glucose, UA NEGATIVE NEGATIVE mg/dL   Hgb urine dipstick LARGE (A) NEGATIVE   Bilirubin Urine SMALL (A) NEGATIVE   Ketones, ur NEGATIVE NEGATIVE mg/dL   Protein, ur 161 (A) NEGATIVE mg/dL   Nitrite POSITIVE (A) NEGATIVE   Leukocytes, UA TRACE (A) NEGATIVE  Protein / creatinine ratio, urine     Status: Abnormal   Collection Time: 12/09/15  4:40 PM  Result Value Ref Range   Creatinine, Urine 190.00 mg/dL   Total Protein, Urine 175 mg/dL   Protein Creatinine Ratio 0.92 (H) 0.00 - 0.15 mg/mg[Cre]  Urine microscopic-add on     Status: Abnormal   Collection Time: 12/09/15  4:40 PM  Result Value Ref Range   Squamous Epithelial / LPF 0-5 (A) NONE SEEN   WBC, UA 0-5 0 - 5 WBC/hpf   RBC / HPF TOO NUMEROUS TO COUNT 0 - 5 RBC/hpf   Bacteria, UA FEW (A) NONE SEEN  CBC     Status: Abnormal   Collection Time: 12/09/15  5:16 PM  Result Value Ref Range   WBC 5.3 4.0 - 10.5 K/uL   RBC 3.76 (L) 3.87 - 5.11 MIL/uL   Hemoglobin 10.9 (L) 12.0 - 15.0 g/dL    HCT 09.6 (L) 04.5 - 46.0 %   MCV 88.3 78.0 - 100.0 fL   MCH 29.0 26.0 - 34.0 pg   MCHC 32.8 30.0 - 36.0  g/dL   RDW 16.1 09.6 - 04.5 %   Platelets 375 150 - 400 K/uL  Comprehensive metabolic panel     Status: Abnormal   Collection Time: 12/09/15  5:16 PM  Result Value Ref Range   Sodium 138 135 - 145 mmol/L   Potassium 3.4 (L) 3.5 - 5.1 mmol/L   Chloride 106 101 - 111 mmol/L   CO2 25 22 - 32 mmol/L   Glucose, Bld 97 65 - 99 mg/dL   BUN 16 6 - 20 mg/dL   Creatinine, Ser 4.09 0.44 - 1.00 mg/dL   Calcium 8.4 (L) 8.9 - 10.3 mg/dL   Total Protein 7.2 6.5 - 8.1 g/dL   Albumin 3.4 (L) 3.5 - 5.0 g/dL   AST 21 15 - 41 U/L   ALT 11 (L) 14 - 54 U/L   Alkaline Phosphatase 90 38 - 126 U/L   Total Bilirubin 0.6 0.3 - 1.2 mg/dL   GFR calc non Af Amer >60 >60 mL/min   GFR calc Af Amer >60 >60 mL/min   Anion gap 7 5 - 15  Differential     Status: None   Collection Time: 12/09/15  5:16 PM  Result Value Ref Range   Neutrophils Relative % 59 %   Neutro Abs 3.1 1.7 - 7.7 K/uL   Lymphocytes Relative 30 %   Lymphs Abs 1.5 0.7 - 4.0 K/uL   Monocytes Relative 5 %   Monocytes Absolute 0.3 0.1 - 1.0 K/uL   Eosinophils Relative 5 %   Eosinophils Absolute 0.3 0.0 - 0.7 K/uL   Basophils Relative 1 %   Basophils Absolute 0.1 0.0 - 0.1 K/uL  Urinalysis, Routine w reflex microscopic (not at [facility])     Status: Abnormal   Collection Time: 12/09/15  7:15 PM  Result Value Ref Range   Color, Urine YELLOW YELLOW   APPearance CLEAR CLEAR   Specific Gravity, Urine 1.020 1.005 - 1.030   pH 5.5 5.0 - 8.0   Glucose, UA NEGATIVE NEGATIVE mg/dL   Hgb urine dipstick MODERATE (A) NEGATIVE   Bilirubin Urine NEGATIVE NEGATIVE   Ketones, ur NEGATIVE NEGATIVE mg/dL   Protein, ur NEGATIVE NEGATIVE mg/dL   Nitrite NEGATIVE NEGATIVE   Leukocytes, UA NEGATIVE NEGATIVE  Protein / creatinine ratio, urine     Status: None   Collection Time: 12/09/15  7:15 PM  Result Value Ref Range   Creatinine, Urine 179.00 mg/dL    Total Protein, Urine 16 mg/dL   Protein Creatinine Ratio 0.09 0.00 - 0.15 mg/mg[Cre]  Urine microscopic-add on     Status: Abnormal   Collection Time: 12/09/15  7:15 PM  Result Value Ref Range   Squamous Epithelial / LPF 0-5 (A) NONE SEEN   WBC, UA 0-5 0 - 5 WBC/hpf   RBC / HPF 0-5 0 - 5 RBC/hpf   Bacteria, UA RARE (A) NONE SEEN   Urine-Other MUCOUS PRESENT     IMAGING [place] Mfm Fetal Bpp Wo Non Stress  11/21/2015  OBSTETRICAL ULTRASOUND: This exam was performed within a [facility]. The OB US report was generated in the AS system, and faxed to the ordering physician.  This report is available in the [employer]. See the AS Obstetric US report via the Image Link.  [place] Mfm Fetal Bpp Wo Nst Addl Gestation  11/21/2015  OBSTETRICAL ULTRASOUND: This exam was performed within a [facility]. The OB US report was generated in the AS system, and faxed to the  ordering physician.  This report is available in the [employer]. See the AS Obstetric US report via the Image Link.   MAU COURSE  MDM  ASSESSMENT 1. Preeclampsia in postpartum period   2. Postpartum UTI     PLAN Admit for magnesium sulfate Rocephin  Charles A. Clearance Coots MD 12/09/2015 11;45 PM

## 2015-12-10 NOTE — Progress Notes (Signed)
Post Partum Day # 17 Subjective: no complaints and denies HA or visual changes  Objective: Blood pressure 122/78, pulse 74, temperature 98.4 F (36.9 C), temperature source Oral, resp. rate 18, height 4\' 11"  (1.499 m), weight 194 lb 8 oz (88.225 kg), SpO2 99 %, unknown if currently breastfeeding.  Physical Exam:  General: alert and no distress Lochia: appropriate Uterine Fundus: firm Incision: healing well DVT Evaluation: No evidence of DVT seen on physical exam.   Recent Labs  12/09/15 1716  HGB 10.9*  HCT 33.2*    Assessment/Plan: Postpartum preeclampsia.  Stable.  Continue magnesium sulfate.   LOS: 1 day   HARPER,CHARLES A 12/10/2015, 6:58 AM

## 2015-12-11 LAB — URINE CULTURE
CULTURE: NO GROWTH
Special Requests: NORMAL

## 2015-12-11 NOTE — Progress Notes (Signed)
Subjective: Postpartum Day 18: Cesarean Delivery Patient reports no complaints.  Objective: Vital signs in last 24 hours: Temp:  [97.7 F (36.5 C)-98.6 F (37 C)] 98.6 F (37 C) (06/04 0615) Pulse Rate:  [67-81] 76 (06/04 0615) Resp:  [15-18] 15 (06/04 0615) BP: (112-134)/(64-76) 125/65 mmHg (06/04 0615) SpO2:  [98 %-100 %] 99 % (06/04 0615) Weight:  [199 lb (90.267 kg)] 199 lb (90.267 kg) (06/04 0615)  Physical Exam:  General: alert and no distress Lochia: appropriate Uterine Fundus: firm Incision: healing well DVT Evaluation: No evidence of DVT seen on physical exam. No significant calf/ankle edema.   Recent Labs  12/09/15 1716  HGB 10.9*  HCT 33.2*    Assessment/Plan: Status post Cesarean section.  Postpartum preeclampsia.  Stable.  Continue Magnesium Sulfate / Labetalol  Roosevelt Bisher A 12/11/2015, 6:57 AM

## 2015-12-11 NOTE — Lactation Note (Signed)
Lactation Consultation Note  Patient Name: [name] ZOXWR'UToday's Date: 12/11/2015   Consult with mom readmitted for MgSo4.  Mom with twins born 11/23/15 at 37w 5d at home. Mom is pumping with DEBP and has 6 bottles of 1-2 oz pumped in refrigerator on unit. Mom reports pumping is going well and she does not have any questions/concerns. Advised her to [facility].      Maternal Data    Feeding    LATCH Score/Interventions                      Lactation Tools Discussed/Used     Consult Status      Ed BlalockSharon S Kalman Nylen 12/11/2015, 8:13 AM

## 2015-12-11 NOTE — Plan of Care (Signed)
Problem: Physical Regulation: Goal: Risk for medication side effects will decrease Outcome: Completed/Met Date Met:  12/11/15 Feels better now that Magnesium drip is off.Stated that it had made her feel tired and heavy. Goal: Will remain free of preeclampsia complications Outcome: Completed/Met Date Met:  12/11/15 Blood pressures off Magnesium are WNL.DTR's are WNL headache remains but is better per patient.

## 2015-12-11 NOTE — Plan of Care (Signed)
Problem: Physical Regulation: Goal: Will remain free from infection Outcome: Completed/Met Date Met:  12/11/15 Urine culture shows no growth.Abx discontinued.

## 2015-12-12 MED ORDER — OXYCODONE-ACETAMINOPHEN 5-325 MG PO TABS: 1.0000 | ORAL_TABLET | ORAL | Status: DC | PRN

## 2015-12-12 NOTE — Progress Notes (Signed)
Subjective: Postpartum Day 19: Cesarean Delivery Patient reports no complaints.  Objective: Vital signs in last 24 hours: Temp:  [97.9 F (36.6 C)-99.1 F (37.3 C)] 99 F (37.2 C) (06/05 0158) Pulse Rate:  [72-87] 87 (06/05 0158) Resp:  [15-18] 15 (06/05 0158) BP: (103-134)/(49-84) 103/49 mmHg (06/05 0158) SpO2:  [98 %-100 %] 98 % (06/05 0158) Weight:  [197 lb (89.359 kg)-199 lb (90.267 kg)] 197 lb (89.359 kg) (06/05 0438)  Physical Exam:  General: alert and no distress Lochia: appropriate Uterine Fundus: firm Incision: healing well DVT Evaluation: No evidence of DVT seen on physical exam. No significant calf/ankle edema.   Recent Labs  12/09/15 1716  HGB 10.9*  HCT 33.2*    Assessment/Plan: Status post Cesarean section. Doing well postoperatively.  Discharge home with standard precautions and return to clinic in 2 weeks.  Julia Fowler A 12/12/2015, 4:56 AM

## 2015-12-12 NOTE — Discharge Summary (Signed)
Physician Discharge Summary  Patient ID: Julia Fowler MRN: [id number] DOB/AGE: March 04, 1986 30 y.o.  Admit date: 12/09/2015 Discharge date: 12/12/2015  Admission Diagnoses: Postpartum Preeclampsia  Discharge Diagnoses: Same Active Problems:   Preeclampsia in postpartum period   Discharged Condition: good  Hospital Course: Admitted ~ 2 weeks postpartum with HA and elevated BP.  Responded well to therapy.  Discharged home in good condition.  Consults: None  Significant Diagnostic Studies: labs: CBC, CMET  Treatments: IV hydration, cardiac meds: labetolol and therapies: magnesium sulfate seizure prophylaxis  Discharge Exam: Blood pressure 103/49, pulse 87, temperature 99 F (37.2 C), temperature source Oral, resp. rate 15, height 4\' 11"  (1.499 m), weight 197 lb (89.359 kg), SpO2 98 %, unknown if currently breastfeeding. General appearance: alert and no distress Resp: clear to auscultation bilaterally Cardio: regular rate and rhythm, S1, S2 normal, no murmur, click, rub or gallop GI: normal findings: soft, non-tender Extremities: extremities normal, atraumatic, no cyanosis or edema Incision/Wound:C, D, I.  Disposition: 01-Home or Self Care  Discharge Instructions    Diet - low sodium heart healthy    Complete by:  As directed             Medication List    TAKE these medications        amoxicillin-clavulanate 875-125 MG tablet  Commonly known as:  AUGMENTIN  Take 1 tablet by mouth 2 (two) times daily.     ibuprofen 600 MG tablet  Commonly known as:  ADVIL,MOTRIN  Take 1 tablet (600 mg total) by mouth every 6 (six) hours as needed for mild pain.     iron polysaccharides 150 MG capsule  Commonly known as:  NIFEREX  Take 1 capsule (150 mg total) by mouth daily.     OB COMPLETE PETITE 35-5-1-200 MG Caps  Take 1 capsule by mouth at bedtime.     oxyCODONE-acetaminophen 5-325 MG tablet  Commonly known as:  PERCOCET/ROXICET  Take 1-2 tablets by mouth every 4 (four)  hours as needed for moderate pain or severe pain (pain scale > 7).     oxyCODONE-acetaminophen 5-325 MG tablet  Commonly known as:  PERCOCET/ROXICET  Take 1-2 tablets by mouth every 4 (four) hours as needed for moderate pain or severe pain (pain scale > 7).           Follow-up Information    Follow up with HARPER,CHARLES A, MD. Schedule an appointment as soon as possible for a visit in 1 week.   Specialty:  Obstetrics and Gynecology   Contact information:   [address] [place] [address] [phone]       Signed: Brock BadHARPER,CHARLES A 12/12/2015, 5:10 AM

## 2015-12-12 NOTE — Progress Notes (Signed)
Teaching complete  Pt ambulated out  Scripts given

## 2015-12-14 ENCOUNTER — Ambulatory Visit: Payer: Medicaid Other | Admitting: Obstetrics

## 2015-12-29 ENCOUNTER — Ambulatory Visit (INDEPENDENT_AMBULATORY_CARE_PROVIDER_SITE_OTHER): Payer: Medicaid Other | Admitting: Obstetrics

## 2015-12-29 ENCOUNTER — Encounter: Payer: Self-pay | Admitting: Obstetrics

## 2015-12-29 DIAGNOSIS — G8918 Other acute postprocedural pain: Secondary | ICD-10-CM

## 2015-12-29 DIAGNOSIS — Z30011 Encounter for initial prescription of contraceptive pills: Secondary | ICD-10-CM

## 2015-12-29 MED ORDER — OXYCODONE-ACETAMINOPHEN 10-325 MG PO TABS: 1.0000 | ORAL_TABLET | ORAL | Status: DC | PRN

## 2015-12-29 MED ORDER — NORETHINDRONE 0.35 MG PO TABS: 1.0000 | ORAL_TABLET | Freq: Every day | ORAL | Status: DC

## 2015-12-29 NOTE — Progress Notes (Signed)
Subjective:     [place] BingLisa R Fowler is a 30 y.o. female who presents for a postpartum visit. She is 4 weeks postpartum following a low cervical transverse Cesarean section. I have fully reviewed the prenatal and intrapartum course. The delivery was at 37.5 gestational weeks. Outcome: repeat cesarean section, low transverse incision. Anesthesia: spinal. Postpartum course has been normal. Baby's course has been normal. Baby is feeding by breast. Bleeding thin lochia. Bowel function is normal. Bladder function is normal. Patient is not sexually active. Contraception method is abstinence. Postpartum depression screening: negative.  Tobacco, alcohol and substance abuse history reviewed.  Adult immunizations reviewed including TDAP, rubella and varicella.  The following portions of the patient's history were reviewed and updated as appropriate: allergies, current medications, past family history, past medical history, past social history, past surgical history and problem list.  Review of Systems A comprehensive review of systems was negative.   Objective:    BP 126/89 mmHg  Pulse 80  Wt 194 lb (87.998 kg)   PE:      General:  Alert and no distress      Breasts:  Soft, NT      Abdomen:  Soft, NT.  Incision C, D, I.      Extremities:  No C, C, E.    Assessment:     Normal postpartum exam. Pap smear not done at today's visit.     Contraceptive counseling and advice.  Wants OCP's now and possibly Nexplanon later.  Plan:    1. Contraception: oral progesterone-only contraceptive 2. Micronor Rx 3. Follow up in: 2 weeks or as needed.   Healthy lifestyle practices reviewed

## 2016-01-16 ENCOUNTER — Telehealth: Payer: Self-pay | Admitting: *Deleted

## 2016-01-17 ENCOUNTER — Ambulatory Visit (INDEPENDENT_AMBULATORY_CARE_PROVIDER_SITE_OTHER): Payer: Medicaid Other | Admitting: Obstetrics

## 2016-01-17 VITALS — BP 141/85 | HR 93 | Wt 200.0 lb

## 2016-01-17 DIAGNOSIS — G44001 Cluster headache syndrome, unspecified, intractable: Secondary | ICD-10-CM

## 2016-01-17 DIAGNOSIS — Z01812 Encounter for preprocedural laboratory examination: Secondary | ICD-10-CM

## 2016-01-17 DIAGNOSIS — Z3202 Encounter for pregnancy test, result negative: Secondary | ICD-10-CM

## 2016-01-17 DIAGNOSIS — Z30017 Encounter for initial prescription of implantable subdermal contraceptive: Secondary | ICD-10-CM

## 2016-01-17 DIAGNOSIS — Z3049 Encounter for surveillance of other contraceptives: Secondary | ICD-10-CM | POA: Diagnosis not present

## 2016-01-17 LAB — POCT URINE PREGNANCY: Preg Test, Ur: NEGATIVE

## 2016-01-17 MED ORDER — OXYCODONE HCL 10 MG PO TABS: 10.0000 mg | ORAL_TABLET | Freq: Four times a day (QID) | ORAL | Status: DC | PRN

## 2016-01-17 MED ORDER — BUTALBITAL-APAP-CAFFEINE 50-325-40 MG PO TABS: 2.0000 | ORAL_TABLET | Freq: Four times a day (QID) | ORAL | Status: DC | PRN

## 2016-01-18 ENCOUNTER — Encounter: Payer: Self-pay | Admitting: Obstetrics

## 2016-01-18 NOTE — Progress Notes (Signed)
Nexplanon Procedure Note   PRE-OP DIAGNOSIS: desired long-term, reversible contraception  POST-OP DIAGNOSIS: Same  PROCEDURE: Nexplanon  placement Performing Provider: Bing Neighborsharles A. Clearance CootsHarper MD  Patient education prior to procedure, explained risk, benefits of Nexplanon, reviewed alternative options. Patient reported understanding. Gave consent to continue with procedure.   PROCEDURE:  Pregnancy Text :  Negative Site (check):      left arm         Sterile Preparation:   Betadinex3 Lot # [id number] Expiration Date 05 / 2019  Insertion site was selected 8 - 10 cm from medial epicondyle and marked along with guiding site using sterile marker. Procedure area was prepped and draped in a sterile fashion. 1% Lidocaine 1.5 ml given prior to procedure. Nexplanon  was inserted subcutaneously.Needle was removed from the insertion site. Nexplanon capsule was palpated by provider and patient to assure satisfactory placement. Dressing applied.  Followup: The patient tolerated the procedure well without complications.  Standard post-procedure care is explained and return precautions are given.  Amy Wilson SingerWren CNM

## 2016-01-23 NOTE — Telephone Encounter (Signed)
error 

## 2016-01-31 ENCOUNTER — Encounter (HOSPITAL_COMMUNITY): Payer: Self-pay | Admitting: Emergency Medicine

## 2016-01-31 ENCOUNTER — Emergency Department (HOSPITAL_COMMUNITY)
Admission: EM | Admit: 2016-01-31 | Discharge: 2016-01-31 | Disposition: A | Discharge status: Home / Self Care | Payer: Medicaid Other | Attending: Emergency Medicine | Admitting: Emergency Medicine

## 2016-01-31 ENCOUNTER — Emergency Department (HOSPITAL_COMMUNITY): Payer: Medicaid Other

## 2016-01-31 DIAGNOSIS — Y9289 Other specified places as the place of occurrence of the external cause: Secondary | ICD-10-CM | POA: Diagnosis not present

## 2016-01-31 DIAGNOSIS — E039 Hypothyroidism, unspecified: Secondary | ICD-10-CM | POA: Insufficient documentation

## 2016-01-31 DIAGNOSIS — Y999 Unspecified external cause status: Secondary | ICD-10-CM | POA: Diagnosis not present

## 2016-01-31 DIAGNOSIS — M25561 Pain in right knee: Secondary | ICD-10-CM | POA: Diagnosis present

## 2016-01-31 DIAGNOSIS — Y939 Activity, unspecified: Secondary | ICD-10-CM | POA: Diagnosis not present

## 2016-01-31 DIAGNOSIS — W010XXA Fall on same level from slipping, tripping and stumbling without subsequent striking against object, initial encounter: Secondary | ICD-10-CM | POA: Diagnosis not present

## 2016-01-31 MED ORDER — HYDROCODONE-ACETAMINOPHEN 5-325 MG PO TABS: 2.0000 | ORAL_TABLET | ORAL | 0 refills | Status: DC | PRN

## 2016-01-31 NOTE — ED Provider Notes (Signed)
[facility]-EMERGENCY DEPT Provider Note   CSN: [id number] Arrival date & time: 01/31/16  1744  First Provider Contact:  None     By signing my name below, I, Julia Fowler, attest that this documentation has been prepared under the direction and in the presence of Julia Born, PA-C. Electronically Signed: Placido Fowler, ED Scribe. 01/31/16. 6:03 PM.   History   Chief Complaint Chief Complaint  Patient presents with  . Knee Pain    HPI HPI Comments: Julia Fowler is a 30 y.o. female with a PMHx of rickets who presents to the Emergency Department complaining of a mechanical fall that occurred yesterday. Pt states that she slipped on a wet floor in her kitchen resulting in a forward fall landing directly on her right knee. She reports associated, mild, right anterior knee pain that worsens with ambulation and palpation. She has taken 600 mg ibuprofen every 5-6 hours w/o significant relief. Pt denies a hx of chronic knee pain or a hx of injuries to the region. She denies knee swelling or any other associated symptoms at this time.   Orthopaedist: Dr. Thurston Hole   The history is provided by the patient. No language interpreter was used.    Past Medical History:  Diagnosis Date  . Hypothyroidism    no medication  . Rickets   . Seasonal allergies   . Thyroid disease hypothyroid    Patient Active Problem List   Diagnosis Date Noted  . Preeclampsia in postpartum period 12/09/2015  . S/P cesarean section 11/23/2015  . Twins 11/03/2015  . [redacted] weeks gestation of pregnancy   . Hereditary disease in family possibly affecting fetus, affecting management of mother, antepartum condition or complication   . Cesarean delivery, without mention of indication, delivered, with or without mention of antepartum condition 01/21/2014  . Excessive fetal growth 12/28/2013  . History of C-section 07/14/2013  . Hypothyroid 04/10/2013  . BMI 37.0-37.9, adult 04/10/2013  . Rickets 04/10/2013  . Family  history of breast cancer in first degree relative 04/10/2013    Past Surgical History:  Procedure Laterality Date  . CESAREAN SECTION    . CESAREAN SECTION N/A 01/21/2014   Procedure: REPEAT CESAREAN SECTION;  Surgeon: Brock Bad, MD;  Location: [facility];  Service: Obstetrics;  Laterality: N/A;  . CESAREAN SECTION MULTI-GESTATIONAL N/A 11/23/2015   Procedure: CESAREAN SECTION MULTI-GESTATIONAL/TWINS;  Surgeon: Brock Bad, MD;  Location: [facility];  Service: Obstetrics;  Laterality: N/A;  . TOOTH EXTRACTION Left     OB History    Gravida Para Term Preterm AB Living   4 3 3  0 1 4   SAB TAB Ectopic Multiple Live Births   0 1 0 1 4       Home Medications    Prior to Admission medications   Medication Sig Start Date End Date Taking? Authorizing Provider  amoxicillin-clavulanate (AUGMENTIN) 875-125 MG tablet Take 1 tablet by mouth 2 (two) times daily. Patient not taking: Reported on 12/09/2015 11/28/15   Marlis Edelson, CNM  butalbital-acetaminophen-caffeine (FIORICET) [phone] MG tablet Take 2 tablets by mouth every 6 (six) hours as needed for headache. 01/17/16 01/16/17  Brock Bad, MD  ibuprofen (ADVIL,MOTRIN) 600 MG tablet Take 1 tablet (600 mg total) by mouth every 6 (six) hours as needed for mild pain. 11/26/15   Brock Bad, MD  iron polysaccharides (NIFEREX) 150 MG capsule Take 1 capsule (150 mg total) by mouth daily. 11/26/15   Brock Bad, MD  norethindrone (MICRONOR,CAMILA,ERRIN) 0.35 MG tablet Take 1 tablet (0.35 mg total) by mouth daily. 12/29/15   Brock Bad, MD  Oxycodone HCl 10 MG TABS Take 1 tablet (10 mg total) by mouth every 6 (six) hours as needed. 01/17/16   Brock Bad, MD  oxyCODONE-acetaminophen (PERCOCET) 10-325 MG tablet Take 1 tablet by mouth every 4 (four) hours as needed for pain. 12/29/15   Brock Bad, MD  Prenat-FeCbn-FeAspGl-FA-Omega (OB COMPLETE PETITE) 35-5-1-200 MG CAPS Take 1 capsule by mouth at bedtime.     Historical Provider, MD    Family History Family History  Problem Relation Age of Onset  . Cancer Sister     breast  . Diabetes Maternal Grandmother     Social History Social History  Substance Use Topics  . Smoking status: Never Smoker  . Smokeless tobacco: Never Used  . Alcohol use No     Allergies   Other   Review of Systems Review of Systems  Musculoskeletal: Positive for arthralgias. Negative for joint swelling.  Skin: Negative for color change and wound.    Physical Exam Updated Vital Signs BP 119/95 (BP Location: Left Arm)   Pulse 80   Temp 99 F (37.2 C) (Oral)   Resp 18   Ht  (1.499 m)   Wt 200 lb (90.7 kg)   LMP 01/10/2016   SpO2 96%   BMI 40.40 kg/m   Physical Exam  Constitutional: She is oriented to person, place, and time. She appears well-developed and well-nourished.  HENT:  Head: Normocephalic.  Eyes: EOM are normal.  Neck: Normal range of motion.  Pulmonary/Chest: Effort normal.  Abdominal: She exhibits no distension.  Musculoskeletal: Normal range of motion.  Right knee: No obvious swelling. Patient has varus deformity Mild-moderate tenderness to palpation over the knee cap. FROM. Normal gait.   Neurological: She is alert and oriented to person, place, and time.  Psychiatric: She has a normal mood and affect.  Nursing note and vitals reviewed.  ED Treatments / Results   Radiology Dg Knee Complete 4 Views Right  Result Date: 01/31/2016 CLINICAL DATA:  Slipped in the kitchen last night falling on the knee. Worsening pain. EXAM: RIGHT KNEE - COMPLETE 4+ VIEW COMPARISON:  None. FINDINGS: Medial an lateral compartment osteoarthritis with joint space narrowing, osteophytes and articular surface irregularity. Mild osteoarthritis at the patellofemoral joint. Small amount of joint fluid. No intra-articular loose bodies identified. No sign of acute fracture. IMPRESSION: Chronic osteoarthritic changes.  No acute finding. Electronically  Signed   By: Julia Fowler M.D.   On: 01/31/2016 18:44   Procedures Procedures  DIAGNOSTIC STUDIES: Oxygen Saturation is 96% on RA, normal by my interpretation.    COORDINATION OF CARE: 6:02 PM Discussed next steps with pt. Pt verbalized understanding and is agreeable with the plan.    Initial Impression / Assessment and Plan / ED Course  I have reviewed the triage vital signs and the nursing notes.  Pertinent labs & imaging results that were available during my care of the patient were reviewed by me and considered in my medical decision making (see chart for details).  Clinical Course   30 year old female who presents with acute on chronic R knee pain s/p mechanical fall. Xray remarkable for chronic arthritic changes most likely due to varus deformity from rickets. She sees Dr. Thurston Hole with Ortho. Encouraged follow up with Ortho and NSAIDs and short course of norco. Patient is NAD, non-toxic, with stable VS. Patient is informed  of clinical course, understands medical decision making process, and agrees with plan. Opportunity for questions provided and all questions answered. Return precautions given.  I personally performed the services described in this documentation, which was scribed in my presence. The recorded information has been reviewed and is accurate.   Final Clinical Impressions(s) / ED Diagnoses   Final diagnoses:  Knee pain, acute, right       Julia Born, PA-C 01/31/16 1945    Rolan Bucco, MD 02/01/16 0001

## 2016-01-31 NOTE — ED Triage Notes (Signed)
Pt sts tripped and fell yesterday now having right knee pain

## 2016-02-02 ENCOUNTER — Ambulatory Visit: Payer: Self-pay | Admitting: Obstetrics

## 2016-02-16 ENCOUNTER — Encounter: Payer: Self-pay | Admitting: *Deleted

## 2016-02-27 ENCOUNTER — Ambulatory Visit: Payer: Self-pay | Admitting: Obstetrics

## 2016-02-29 ENCOUNTER — Ambulatory Visit: Payer: Self-pay | Admitting: Obstetrics

## 2016-03-14 ENCOUNTER — Ambulatory Visit: Payer: Self-pay | Admitting: Obstetrics

## 2016-03-20 ENCOUNTER — Telehealth: Payer: Self-pay | Admitting: *Deleted

## 2016-03-20 NOTE — Telephone Encounter (Signed)
Patient is calling stating her referral to the orthopedic has expired and she would like it renewed. She is also requesting a refill of her headache medication.

## 2016-03-21 ENCOUNTER — Other Ambulatory Visit: Payer: Self-pay | Admitting: Obstetrics

## 2016-03-21 DIAGNOSIS — G44001 Cluster headache syndrome, unspecified, intractable: Secondary | ICD-10-CM

## 2016-03-21 DIAGNOSIS — R52 Pain, unspecified: Secondary | ICD-10-CM

## 2016-03-21 NOTE — Telephone Encounter (Signed)
Referral made to Ortho She has to come to office for Rx for Fioricet for HA

## 2016-03-23 NOTE — Telephone Encounter (Signed)
Patient notified- she can come to the office for Rx.

## 2016-04-14 IMAGING — US USMFM FETAL BPP W/O NON-STRESS ADDL GEST
1 series · 13 of 28 positions shown · non-contrast
Comparison: none

[Series 1: usmfm fetal bpp w/o non-stress addl gest · 34 acquisitions, 13 frames shown]
[im 2/34]
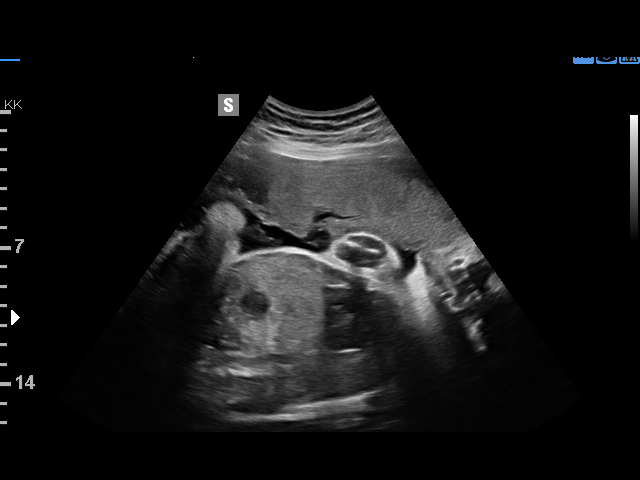
[im 4/34]
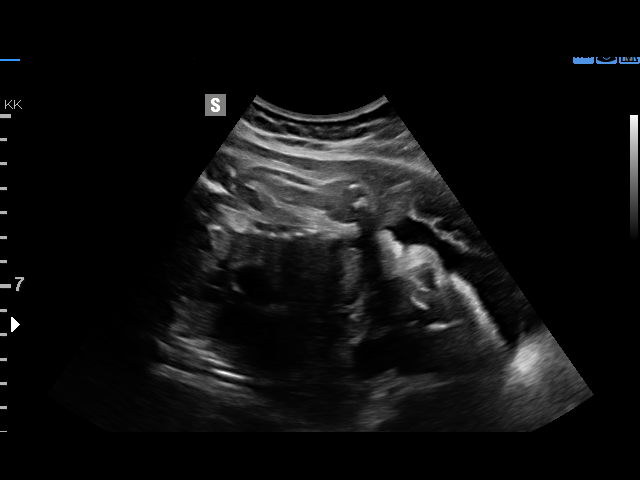
[im 7/34]
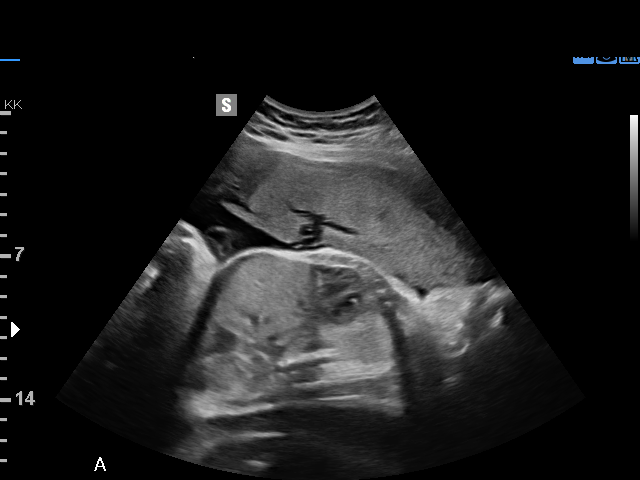
[im 9/34]
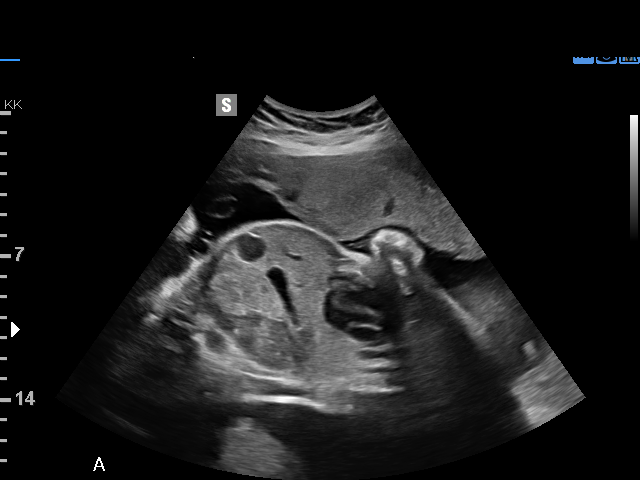
[im 12/34]
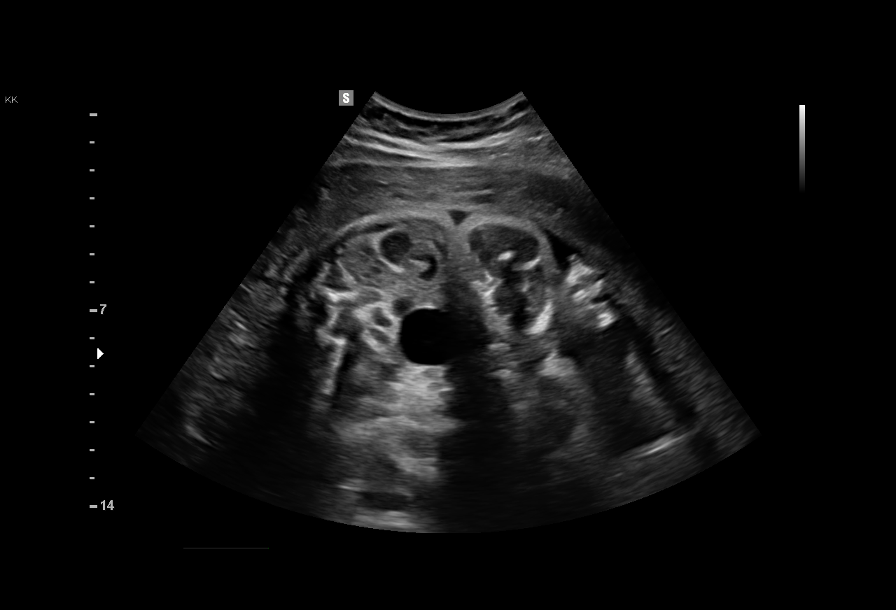
[im 14/34]
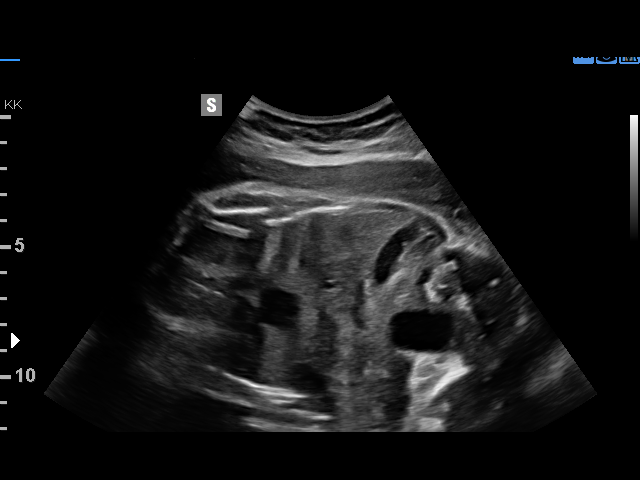
[im 18/34]
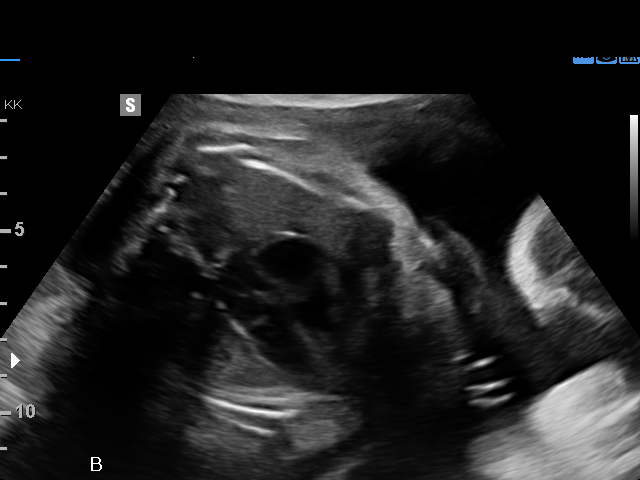
[im 20/34]
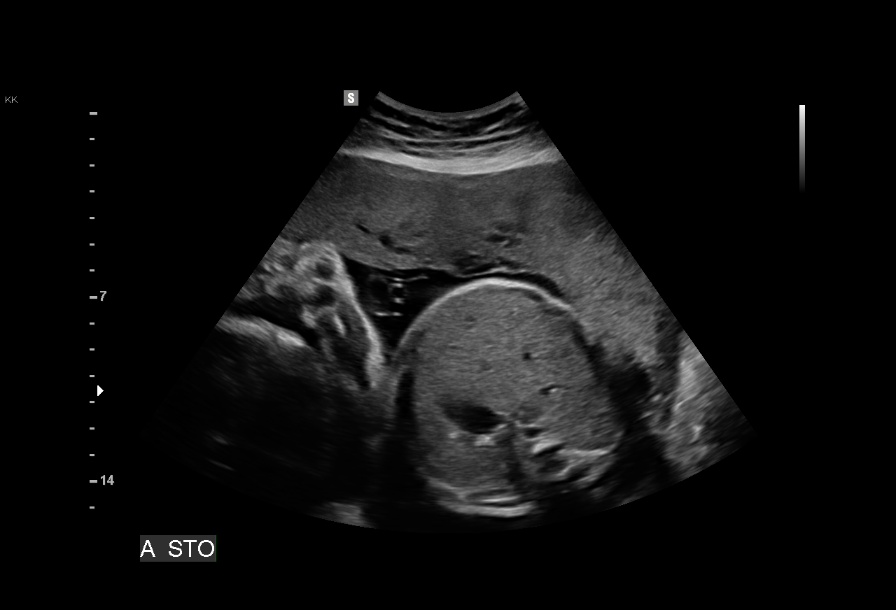
[im 23/34]
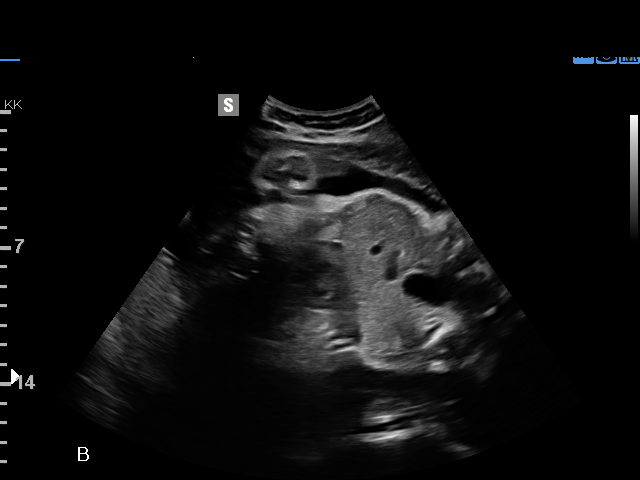
[im 25/34]
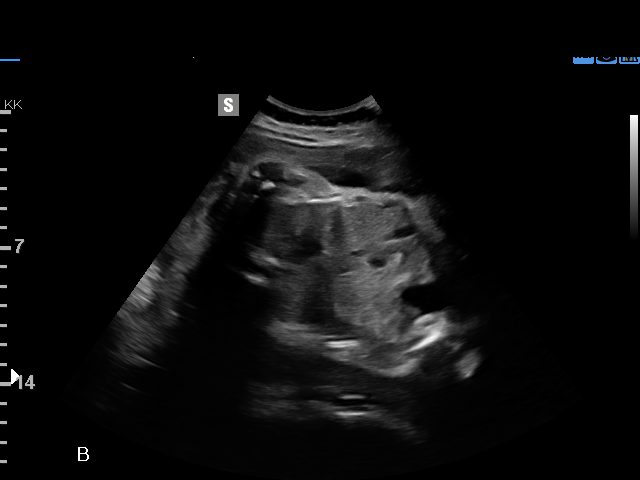
[im 27/34]
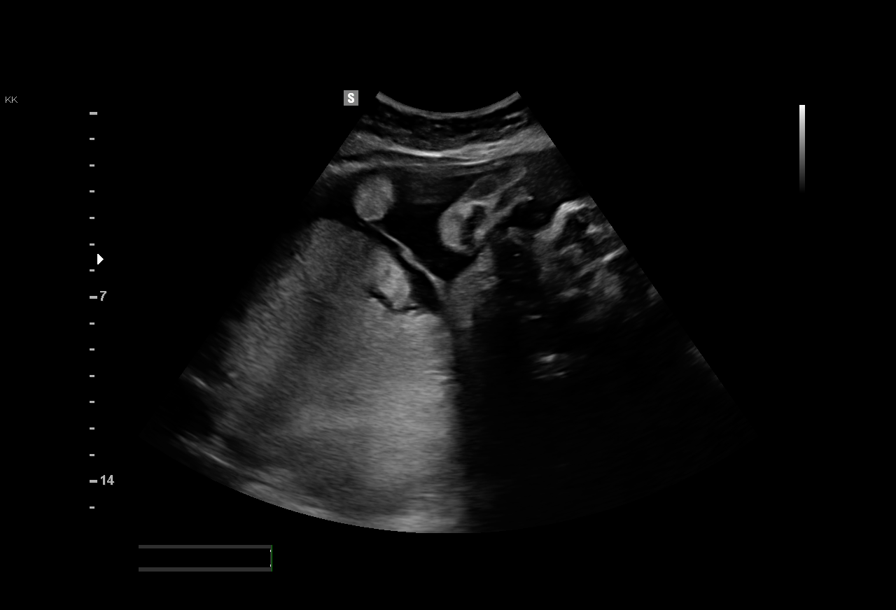
[im 30/34]
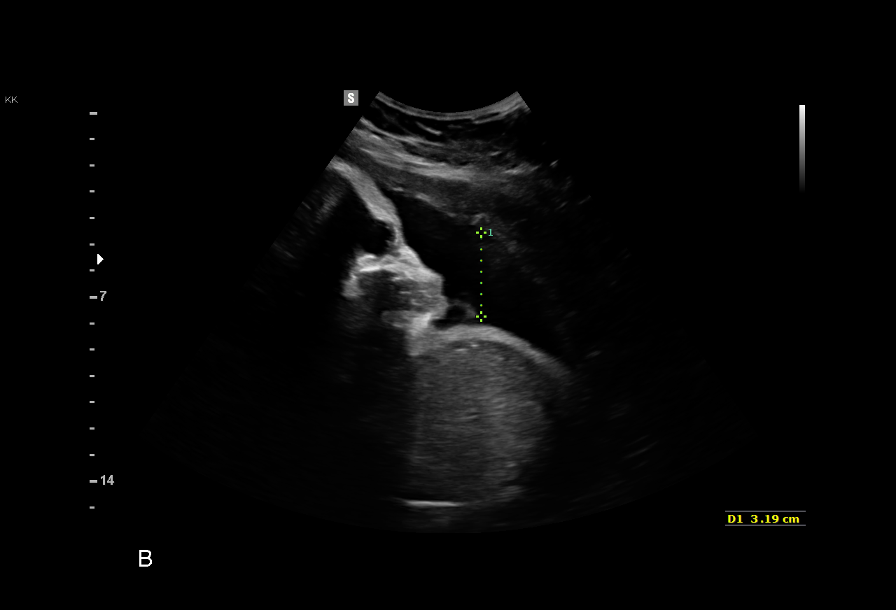
[im 32/34]
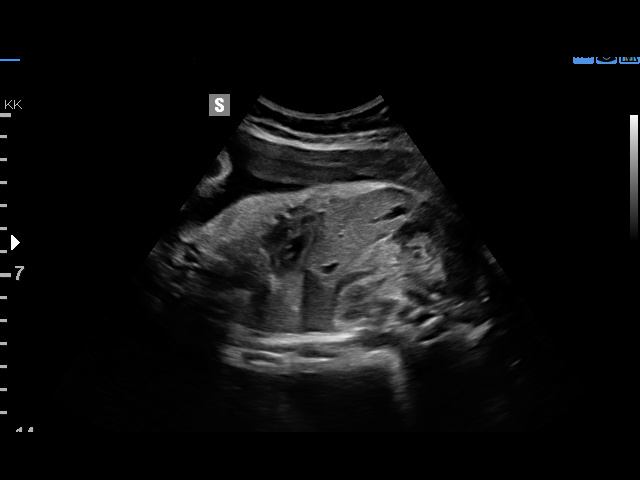

[13 of 28 positions shown; findings below may reference images not displayed]

GESTATION

1  ZMARIALY DEPRESS           [id number]      [id number]     [id number]
2  ZMARIALY DEPRESS           [id number]      [id number]     [id number]
Indications

32 weeks gestation of pregnancy
Twin pregnancy, di/di, third trimester
Previous cesarean delivery, antepartum
Family history of genetic disorder; pt and
other family members with Rickets (either
AD or XL)
Obesity complicating pregnancy, third
trimester
OB History

Height:       4'11"   Weight:   211       BMI:
Gravidity:    4         Term:   2        Prem:   0        SAB:   0
TOP:          1       Ectopic:  0        Living: 2
Fetal Evaluation (Fetus A)

Num Of Fetuses:     2
Fetal Heart         138
Rate(bpm):
Cardiac Activity:   Observed
Fetal Lie:          Left Fetus
Presentation:       Cephalic
Placenta:           Anterior, above cervical os
P. Cord Insertion:  Previously Visualized
Membrane Desc:      Dividing Membrane seen - Dichorionic.
Amniotic Fluid
AFI FV:      Subjectively within normal limits
Larg Pckt:     4.2  cm
Biophysical Evaluation (Fetus A)

Amniotic F.V:   Pocket => 2 cm two         F. Tone:        Observed
planes
F. Movement:    Observed                   Score:          [DATE]
F. Breathing:   Observed
Gestational Age (Fetus A)

LMP:           32w 5d       Date:   03/04/15                 EDD:   12/09/15
Best:          32w 5d    Det. By:   LMP  (03/04/15)          EDD:   12/09/15

Fetal Evaluation (Fetus B)

Num Of Fetuses:     2
Fetal Heart         131
Rate(bpm):
Cardiac Activity:   Observed
Fetal Lie:          Right Fetus
Presentation:       Breech
Placenta:           Right lateral, above cervical os
P. Cord Insertion:  Previously Visualized
Membrane Desc:      Dividing Membrane seen - Dichorionic.

Amniotic Fluid
AFI FV:      Subjectively within normal limits
Larg Pckt:     4.1  cm
Biophysical Evaluation (Fetus B)

Amniotic F.V:   Pocket => 2 cm two         F. Tone:        Observed
planes
F. Movement:    Observed                   Score:          [DATE]
F. Breathing:   Observed
Gestational Age (Fetus B)

LMP:           32w 5d       Date:   03/04/15                 EDD:   12/09/15
Best:          32w 5d    Det. By:   LMP  (03/04/15)          EDD:   12/09/15
Impression

Di/Di twin intrauterine pregnancy at 32 weeks 5 days
gestation with fetal cardiac activity
Cephalic Breech presentation
BPP [DATE] with normal appearing amniotic fluid volume x2
Recommendations

Continue 2x weekly NSTs or weekly BPPs
Ultrasound for growth serially

## 2016-04-16 ENCOUNTER — Ambulatory Visit: Payer: Medicaid Other | Admitting: Certified Nurse Midwife

## 2016-06-04 ENCOUNTER — Ambulatory Visit: Payer: Medicaid Other

## 2016-06-20 ENCOUNTER — Encounter: Payer: Self-pay | Admitting: Obstetrics

## 2016-06-20 ENCOUNTER — Ambulatory Visit (INDEPENDENT_AMBULATORY_CARE_PROVIDER_SITE_OTHER): Payer: Medicaid Other | Admitting: Obstetrics

## 2016-06-20 VITALS — BP 140/80 | HR 77 | Temp 98.0°F | Wt 213.7 lb

## 2016-06-20 DIAGNOSIS — L738 Other specified follicular disorders: Secondary | ICD-10-CM | POA: Diagnosis not present

## 2016-06-20 DIAGNOSIS — Z975 Presence of (intrauterine) contraceptive device: Secondary | ICD-10-CM

## 2016-06-20 DIAGNOSIS — N921 Excessive and frequent menstruation with irregular cycle: Secondary | ICD-10-CM | POA: Diagnosis not present

## 2016-06-20 DIAGNOSIS — N760 Acute vaginitis: Secondary | ICD-10-CM

## 2016-06-20 DIAGNOSIS — Z978 Presence of other specified devices: Secondary | ICD-10-CM | POA: Diagnosis not present

## 2016-06-20 MED ORDER — CLINDAMYCIN PHOSPHATE 1 % EX LOTN: TOPICAL_LOTION | Freq: Two times a day (BID) | CUTANEOUS | 1 refills | Status: DC

## 2016-06-20 MED ORDER — TERCONAZOLE 0.8 % VA CREA: 1.0000 | TOPICAL_CREAM | Freq: Every day | VAGINAL | 1 refills | Status: DC

## 2016-06-20 MED ORDER — NORETHIN ACE-ETH ESTRAD-FE 1-20 MG-MCG(24) PO CAPS: 1.0000 | ORAL_CAPSULE | Freq: Every day | ORAL | 2 refills | Status: DC

## 2016-06-20 NOTE — Patient Instructions (Addendum)

## 2016-06-20 NOTE — Progress Notes (Signed)
Patient ID: Julia Fowler, female   DOB: 11/23/1985, 30 y.o.   MRN: [id number]  Chief Complaint  Patient presents with  . other    Possible yeast infection    HPI Julia Fowler is a 30 y.o. female.  Has vaginal discharge with itching.  Thinks that she may have yeast infection.  Also has " hair-bump " in mons pubis that won't go away.  She admits to squeezing it and it just keeps coming back.  Having BTB with Nexplanon and is considering having Nexplanon removed. HPI  Past Medical History:  Diagnosis Date  . Hypothyroidism    no medication  . Rickets   . Seasonal allergies   . Thyroid disease hypothyroid    Past Surgical History:  Procedure Laterality Date  . CESAREAN SECTION    . CESAREAN SECTION N/A 01/21/2014   Procedure: REPEAT CESAREAN SECTION;  Surgeon: Brock Badharles A Donell Sliwinski, MD;  Location: [facility];  Service: Obstetrics;  Laterality: N/A;  . CESAREAN SECTION MULTI-GESTATIONAL N/A 11/23/2015   Procedure: CESAREAN SECTION MULTI-GESTATIONAL/TWINS;  Surgeon: Brock Badharles A Dulcey Riederer, MD;  [facility];  Service: Obstetrics;  Laterality: N/A;  . TOOTH EXTRACTION Left     Family History  Problem Relation Age of Onset  . Cancer Sister     breast  . Diabetes Maternal Grandmother     Social History Social History  Substance Use Topics  . Smoking status: Never Smoker  . Smokeless tobacco: Never Used  . Alcohol use No    Allergies  Allergen Reactions  . Other Other (See Comments) and Cough    Pt states that she is allergic to cats/dogs.   Reaction:  Itchy, watery eyes     Current Outpatient Prescriptions  Medication Sig Dispense Refill  . butalbital-acetaminophen-caffeine (FIORICET) 50-325-40 MG tablet Take 2 tablets by mouth every 6 (six) hours as needed for headache. (Patient not taking: Reported on 06/20/2016) 40 tablet 0  . HYDROcodone-acetaminophen (NORCO/VICODIN) 5-325 MG tablet Take 2 tablets by mouth every 4 (four) hours as needed. (Patient not taking: Reported  on 06/20/2016) 6 tablet 0  . ibuprofen (ADVIL,MOTRIN) 600 MG tablet Take 1 tablet (600 mg total) by mouth every 6 (six) hours as needed for mild pain. (Patient not taking: Reported on 06/20/2016) 30 tablet 5  . iron polysaccharides (NIFEREX) 150 MG capsule Take 1 capsule (150 mg total) by mouth daily. (Patient not taking: Reported on 06/20/2016) 30 capsule 5  . norethindrone (MICRONOR,CAMILA,ERRIN) 0.35 MG tablet Take 1 tablet (0.35 mg total) by mouth daily. (Patient not taking: Reported on 06/20/2016) 1 Package 11  . Oxycodone HCl 10 MG TABS Take 1 tablet (10 mg total) by mouth every 6 (six) hours as needed. (Patient not taking: Reported on 06/20/2016) 40 tablet 0  . oxyCODONE-acetaminophen (PERCOCET) 10-325 MG tablet Take 1 tablet by mouth every 4 (four) hours as needed for pain. (Patient not taking: Reported on 06/20/2016) 40 tablet 0  . Prenat-FeCbn-FeAspGl-FA-Omega (OB COMPLETE PETITE) 35-5-1-200 MG CAPS Take 1 capsule by mouth at bedtime.     No current facility-administered medications for this visit.     Review of Systems Review of Systems Constitutional: negative for fatigue and weight loss Respiratory: negative for cough and wheezing Cardiovascular: negative for chest pain, fatigue and palpitations Gastrointestinal: negative for abdominal pain and change in bowel habits Genitourinary:positive for BTB with Nexplanon, vaginal discharge with itching and persistent hair bump in mons pubis Integument/breast: negative for nipple discharge Musculoskeletal:negative for myalgias Neurological: negative for gait problems  and tremors Behavioral/Psych: negative for abusive relationship, depression Endocrine: negative for temperature intolerance      Blood pressure 140/80, pulse 77, temperature 98 F (36.7 C), temperature source Oral, weight 213 lb 11.2 oz (96.9 kg), unknown if currently breastfeeding.  Physical Exam Physical Exam General:   alert  Skin:   no rash or abnormalities  Lungs:    clear to auscultation bilaterally  Heart:   regular rate and rhythm, S1, S2 normal, no murmur, click, rub or gallop  Breasts:   normal without suspicious masses, skin or nipple changes or axillary nodes  Abdomen:  normal findings: no organomegaly, soft, non-tender and no hernia  Pelvis:  External genitalia: normal general appearance Urinary system: urethral meatus normal and bladder without fullness, nontender Vaginal: normal without tenderness, induration or masses Cervix: normal appearance Adnexa: normal bimanual exam Uterus: anteverted and non-tender, normal size    50% of 15 min visit spent on counseling and coordination of care.    Data Reviewed Wet prep  Assessment     Vaginitis, probably yeast Folliculitis BTB with Nexplanon    Plan    Terazol 3 Rx for yeast Cleocin T Rx for folliculitis Taytulla ( 3 pks. ) dispensed for BTB on Nexplanon Follow up in 4 months   No orders of the defined types were placed in this encounter.  No orders of the defined types were placed in this encounter.

## 2016-06-28 LAB — NUSWAB VG+, CANDIDA 6SP
CANDIDA ALBICANS, NAA: POSITIVE — AB
CANDIDA LUSITANIAE, NAA: POSITIVE — AB
CANDIDA PARAPSILOSIS, NAA: NEGATIVE
CANDIDA TROPICALIS, NAA: NEGATIVE
CHLAMYDIA TRACHOMATIS, NAA: NEGATIVE
Candida glabrata, NAA: NEGATIVE
Candida krusei, NAA: NEGATIVE
Neisseria gonorrhoeae, NAA: NEGATIVE
TRICH VAG BY NAA: NEGATIVE

## 2016-07-03 ENCOUNTER — Ambulatory Visit: Payer: Medicaid Other | Admitting: Obstetrics

## 2016-07-04 ENCOUNTER — Other Ambulatory Visit: Payer: Self-pay | Admitting: Obstetrics

## 2016-07-04 ENCOUNTER — Ambulatory Visit: Payer: Medicaid Other | Attending: Orthopedic Surgery | Admitting: Physical Therapy

## 2016-07-04 ENCOUNTER — Encounter: Payer: Self-pay | Admitting: Physical Therapy

## 2016-07-04 DIAGNOSIS — M6281 Muscle weakness (generalized): Secondary | ICD-10-CM | POA: Insufficient documentation

## 2016-07-04 DIAGNOSIS — M25561 Pain in right knee: Secondary | ICD-10-CM

## 2016-07-04 DIAGNOSIS — M25661 Stiffness of right knee, not elsewhere classified: Secondary | ICD-10-CM | POA: Diagnosis present

## 2016-07-04 DIAGNOSIS — R262 Difficulty in walking, not elsewhere classified: Secondary | ICD-10-CM

## 2016-07-04 DIAGNOSIS — Z9889 Other specified postprocedural states: Secondary | ICD-10-CM | POA: Diagnosis not present

## 2016-07-04 DIAGNOSIS — N760 Acute vaginitis: Secondary | ICD-10-CM

## 2016-07-04 MED ORDER — TERCONAZOLE 0.8 % VA CREA: 1.0000 | TOPICAL_CREAM | Freq: Every day | VAGINAL | 1 refills | Status: DC

## 2016-07-04 NOTE — Therapy (Signed)
[facility] [address] [place], [place], [address] Phone: [phone]   Fax:  [phone]  Physical Therapy Evaluation  Patient Details  Name: Paris BingLisa R Shorten MRN: [id number] Date of Birth: [birth year] Referring Provider: Dr Kristeen Missan Caffrey   Encounter Date: 07/04/2016      PT End of Session - 07/04/16 1130    Visit Number 1   Number of Visits 4   Date for PT Re-Evaluation 08/15/16   Authorization Type Medicaid Menisectomy Eval +3    PT Start Time 0933   PT Stop Time 1015   PT Time Calculation (min) 42 min   Activity Tolerance Patient tolerated treatment well   Behavior [facility] for tasks assessed/performed      Past Medical History:  Diagnosis Date  . Hypothyroidism    no medication  . Rickets   . Seasonal allergies   . Thyroid disease hypothyroid    Past Surgical History:  Procedure Laterality Date  . CESAREAN SECTION    . CESAREAN SECTION N/A 01/21/2014   Procedure: REPEAT CESAREAN SECTION;  Surgeon: Brock Badharles A Harper, MD;  Location: [facility];  Service: Obstetrics;  Laterality: N/A;  . CESAREAN SECTION MULTI-GESTATIONAL N/A 11/23/2015   Procedure: CESAREAN SECTION MULTI-GESTATIONAL/TWINS;  Surgeon: Brock Badharles A Harper, MD;  [facility];  Service: Obstetrics;  Laterality: N/A;  . TOOTH EXTRACTION Left     There were no vitals filed for this visit.       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 She had medial and lateral menisectomys on 06/27/2016. She is currently using a walker for ambulation. She was not using an assistive device prior to surgery.    Pertinent History Ricketts disease    Limitations Standing;Walking   How long can you sit comfortably? < 20 min    How long can you stand comfortably? < 3 min    How long can you walk comfortably? < 300 feet    Diagnostic tests Nothing post op    Patient Stated  Goals To have less pain in her left knee    Currently in Pain? Yes   Pain Score 3    Pain Location Knee   Pain Orientation Right;Medial;Lateral   Pain Descriptors / Indicators Shooting   Pain Type Surgical pain   Pain Onset In the past 7 days   Pain Frequency Constant   Aggravating Factors  walking, standing, sitting    Pain Relieving Factors rest, ice, pain medications    Effect of Pain on Daily Activities difficulty perfroming ADL's             [facility] - 07/04/16 0001      Assessment   Medical Diagnosis Right knee medial and lateral menisectomy    Referring Provider Dr Kristeen Missan Caffrey    Onset Date/Surgical Date 06/27/16   Hand Dominance Right   Next MD Visit 4 weeks    Prior Therapy None      Precautions   Precautions None     Restrictions   Weight Bearing Restrictions Yes   RLE Weight Bearing Weight bearing as tolerated     Balance Screen   Has the patient fallen in the past 6 months No     Home Environment   Living Environment Private residence   Additional Comments 1 step into the house. Full flight of steps inside      Prior Function  Level of Independence Independent   Vocation Full time employment   MetallurgistVocation Requirements Driving a school bus   Leisure walking, going to the gym      Cognition   Overall Cognitive Status Within Functional Limits for tasks assessed   Attention Focused   Focused Attention Appears intact   Memory Appears intact   Awareness Appears intact   Problem Solving Appears intact     Observation/Other Assessments   Observations Bilateral knee varus in standing    Focus on Therapeutic Outcomes (FOTO)  76% limitation on FOTO      Circumferential Edema   Circumferential - Right 48 cm    Circumferential - Left  45.4 cm      Sensation   Additional Comments minor numbness in the back of her knee.     Coordination   Gross Motor Movements are Fluid and Coordinated Yes   Fine Motor Movements are Fluid and Coordinated Yes      Posture/Postural Control   Posture/Postural Control Postural limitations   Postural Limitations Rounded Shoulders;Forward head     ROM / Strength   AROM / PROM / Strength AROM;PROM;Strength     AROM   Overall AROM Comments pain with end range flexion and extension    AROM Assessment Site Knee   Right/Left Knee Right;Left     PROM   Overall PROM Comments full PROM with pain at end range    PROM Assessment Site Knee   Right/Left Knee Right;Left     Strength   Strength Assessment Site Knee   Right/Left Hip Right;Left   Right Hip Flexion 3+/5   Right Hip Extension 4+/5   Right Hip ABduction 4/5   Left Hip Flexion 5/5   Left Hip Extension 5/5   Left Hip ABduction 5/5   Left Hip ADduction 5/5   Right/Left Knee Right;Left   Right Knee Flexion 4/5   Right Knee Extension 4/5   Left Knee Flexion 5/5   Left Knee Extension 5/5     Palpation   Patella mobility Pain with patella mobility                    [facility] Adult PT Treatment/Exercise - 07/04/16 0001      Exercises   Exercises Knee/Hip     Knee/Hip Exercises: Standing   Other Standing Knee Exercises weight shift side to side/ forward and back 2x10; right leg marching x10     Knee/Hip Exercises: Supine   Quad Sets Limitations x10 5 sec hold    Straight Leg Raises Limitations 2x10      Modalities   Modalities Cryotherapy     Cryotherapy   Number Minutes Cryotherapy 10 Minutes   Cryotherapy Location Knee   Type of Cryotherapy Ice pack                PT Education - 07/04/16 1127    Education provided Yes   Education Details HEP, improtance of symptom mangement, use of the cane    Person(s) Educated Patient   Methods Explanation;Demonstration;Tactile cues;Verbal cues   Comprehension Verbalized understanding;Returned demonstration          PT Short Term Goals - 07/04/16 1138      PT SHORT TERM GOAL #1   Title Patient will demsotrate full pain free knee flexion on the right     Baseline pain with end range flexion    Time 3   Period Weeks   Status New     PT  SHORT TERM GOAL #2   Title Patient will increase gross right lower extremity strength to 4+/5   Baseline 4/5 right knee extension 3+/5 right hip flexion 4/5 right hip abduction    Time 3   Period Weeks   Status New     PT SHORT TERM GOAL #3   Title Patient will be independent with HEP for intitial strengthening    Baseline No HEP    Time 3   Period Weeks   Status New     PT SHORT TERM GOAL #4   Title Patient will ambulate 500' with least restrictive assistive device withpout  increased pain    Baseline Pain when walking > 300' with rolling walker    Time 3   Period Weeks   Status New           PT Long Term Goals - 07/04/16 1145      PT LONG TERM GOAL #1   Title Patient will go up/down 8 steps with a reciprocol gait pattern in order to go up and down steps in her house    Baseline 8 steps in her house. Patient unable to get to her second story.    Time 6   Period Weeks   Status New     PT LONG TERM GOAL #2   Title Patient will ambulate 3000' with no device and no increase in pain in order to perfrom IADL's such as shopping    Time 6   Period Weeks   Status New     PT LONG TERM GOAL #3   Title Patient will be independent with HEP to improve strength and stability    Baseline No HEP    Time 6   Period Weeks   Status New     PT LONG TERM GOAL #4   Title Patient will dmesotrate a 46 % limitation on FOTO    Baseline 46% limitation on FOTO    Time 6   Period Weeks   Status New               Plan - 07/04/16 1131    Clinical Impression Statement patient is a 30 year old female S/P right medial and lateral menisectomys. She has Ricketts disease whixh has cuase bilateral knee varus. She is currenlty using a walker. She presents with expected post-op, pain, weakness in the right leg, and pain with end range right knee flexion. She would benefit from skilled therapy to adress the  above deficits. She will be seen for 3 visits per medicaid restrictions. She was seen today for a Moderate complexity evaluation.    Rehab Potential Good   PT Frequency 1x / week   PT Duration 3 weeks   PT Treatment/Interventions ADLs/Self Care Home Management;Electrical Stimulation;Cryotherapy;Iontophoresis 4mg /ml Dexamethasone;Moist Heat;Ultrasound;Gait training;Stair training;Cognitive remediation;Therapeutic exercise;Therapeutic activities;Patient/family education;Manual techniques;Taping;Passive range of motion;Neuromuscular re-education   PT Next Visit Plan Continue to work on progressing patient off walker/ cane, add SAQ, standing hipo 3 way, heel raise, 4 inch step in able. Consider heel slides   PT Home Exercise Plan quad sets, straight leg raise, standing weight shift, standing march with right leg    Consulted and Agree with Plan of Care Patient      Patient will benefit from skilled therapeutic intervention in order to improve the following deficits and impairments:  Abnormal gait, Difficulty walking, Decreased range of motion, Pain, Obesity, Decreased activity tolerance, Decreased strength, Increased edema  Visit Diagnosis: Acute pain of right knee -  Plan: PT plan of care cert/re-cert  Stiffness of right knee, not elsewhere classified - Plan: PT plan of care cert/re-cert  Difficulty in walking, not elsewhere classified - Plan: PT plan of care cert/re-cert  Muscle weakness (generalized) - Plan: PT plan of care cert/re-cert     Problem List Patient Active Problem List   Diagnosis Date Noted  . Preeclampsia in postpartum period 12/09/2015  . S/P cesarean section 11/23/2015  . Twins 11/03/2015  . [redacted] weeks gestation of pregnancy   . Hereditary disease in family possibly affecting fetus, affecting management of mother, antepartum condition or complication   . Cesarean delivery, without mention of indication, delivered, with or without mention of antepartum condition 01/21/2014   . Excessive fetal growth 12/28/2013  . History of C-section 07/14/2013  . Hypothyroid 04/10/2013  . BMI 37.0-37.9, adult 04/10/2013  . Rickets 04/10/2013  . Family history of breast cancer in first degree relative 04/10/2013    Dessie Coma PT DPT  07/04/2016, 12:28 PM  [facility] [address] [place], [place], [address] Phone: [phone]   Fax:  [phone]  Name: MARWAH DISBRO MRN: [id number] Date of Birth: [birth year]

## 2016-07-09 HISTORY — PX: ARTHROSCOPY KNEE W/ DRILLING: SUR92

## 2016-07-12 ENCOUNTER — Other Ambulatory Visit: Payer: Self-pay | Admitting: *Deleted

## 2016-07-12 DIAGNOSIS — L738 Other specified follicular disorders: Secondary | ICD-10-CM

## 2016-07-12 MED ORDER — CLINDAMYCIN PHOSPHATE 1 % EX SOLN: Freq: Two times a day (BID) | CUTANEOUS | 0 refills | Status: DC

## 2016-07-12 NOTE — Progress Notes (Signed)
Cleocin T lotions Rx changed to Cleocin T solution due to coverage. Rx change ordered by Dr Clearance CootsHarper.

## 2016-07-18 ENCOUNTER — Ambulatory Visit: Payer: Medicaid Other | Admitting: Physical Therapy

## 2016-07-23 ENCOUNTER — Encounter: Payer: Self-pay | Admitting: Physical Therapy

## 2016-07-23 ENCOUNTER — Ambulatory Visit: Payer: Medicaid Other | Attending: Orthopedic Surgery | Admitting: Physical Therapy

## 2016-07-23 DIAGNOSIS — R262 Difficulty in walking, not elsewhere classified: Secondary | ICD-10-CM | POA: Diagnosis present

## 2016-07-23 DIAGNOSIS — Z9889 Other specified postprocedural states: Secondary | ICD-10-CM | POA: Insufficient documentation

## 2016-07-23 DIAGNOSIS — M25661 Stiffness of right knee, not elsewhere classified: Secondary | ICD-10-CM | POA: Diagnosis present

## 2016-07-23 DIAGNOSIS — M6281 Muscle weakness (generalized): Secondary | ICD-10-CM

## 2016-07-23 DIAGNOSIS — M25561 Pain in right knee: Secondary | ICD-10-CM

## 2016-07-23 NOTE — Therapy (Signed)
[facility] [address] [place], [place], [address] Phone: [phone]   Fax:  [phone]  Physical Therapy Treatment  Patient Details  Name: Julia Fowler MRN: [id number] Date of Birth: [birth year] Referring Provider: Dr Kristeen Miss   Encounter Date: 07/23/2016      PT End of Session - 07/23/16 1156    Visit Number 2   Number of Visits 4   Date for PT Re-Evaluation 08/15/16   Authorization Type Medicaid Menisectomy Eval +3    PT Start Time 1150   PT Stop Time 1230   PT Time Calculation (min) 40 min   Activity Tolerance Patient tolerated treatment well   Behavior During Therapy [facility] for tasks assessed/performed      Past Medical History:  Diagnosis Date  . Hypothyroidism    no medication  . Rickets   . Seasonal allergies   . Thyroid disease hypothyroid    Past Surgical History:  Procedure Laterality Date  . CESAREAN SECTION    . CESAREAN SECTION N/A 01/21/2014   Procedure: REPEAT CESAREAN SECTION;  Surgeon: Brock Bad, MD;  Location: [facility];  Service: Obstetrics;  Laterality: N/A;  . CESAREAN SECTION MULTI-GESTATIONAL N/A 11/23/2015   Procedure: CESAREAN SECTION MULTI-GESTATIONAL/TWINS;  Surgeon: Brock Bad, MD;  Location: [facility];  Service: Obstetrics;  Laterality: N/A;  . TOOTH EXTRACTION Left     There were no vitals filed for this visit.      Subjective Assessment - 07/23/16 1154    Subjective Patient presents today with imporved pain and using a cane for ambulation. She reports thew pain has been getting better and better.    Pertinent History Ricketts disease    Limitations Standing;Walking   How long can you sit comfortably? < 20 min    How long can you stand comfortably? < 3 min    How long can you walk comfortably? < 300 feet    Diagnostic tests Nothing post op    Patient Stated Goals To have less pain in her left knee    Currently in Pain? Yes   Pain Score 1    Pain Location  Knee   Pain Orientation Right;Medial;Lower   Pain Descriptors / Indicators Shooting   Pain Type Surgical pain   Pain Onset In the past 7 days   Pain Frequency Constant   Aggravating Factors  walking, standing, sitting    Pain Relieving Factors rest, ice, pain medications    Effect of Pain on Daily Activities difficulty perfroming ADL's                          [facility] Adult PT Treatment/Exercise - 07/23/16 0001      Knee/Hip Exercises: Aerobic   Nustep 5 min L3      Knee/Hip Exercises: Standing   Heel Raises Limitations 2x10    Knee Flexion Limitations 2x10   Forward Step Up Limitations 4" step 2x10    Other Standing Knee Exercises step onto air-ex x10      Knee/Hip Exercises: Supine   Short Arc Quad Sets Limitations 2x10 ilb    Bridges Limitations 2x10   Straight Leg Raises Limitations 3x10                 PT Education - 07/23/16 1156    Education provided Yes   Education Details HEP, symptom mangement, use of the cane.    Person(s) Educated Patient   Methods Demonstration;Explanation;Tactile  cues;Verbal cues   Comprehension Verbalized understanding;Returned demonstration          PT Short Term Goals - 07/04/16 1138      PT SHORT TERM GOAL #1   Title Patient will demsotrate full pain free knee flexion on the right    Baseline pain with end range flexion    Time 3   Period Weeks   Status New     PT SHORT TERM GOAL #2   Title Patient will increase gross right lower extremity strength to 4+/5   Baseline 4/5 right knee extension 3+/5 right hip flexion 4/5 right hip abduction    Time 3   Period Weeks   Status New     PT SHORT TERM GOAL #3   Title Patient will be independent with HEP for intitial strengthening    Baseline No HEP    Time 3   Period Weeks   Status New     PT SHORT TERM GOAL #4   Title Patient will ambulate 500' with least restrictive assistive device withpout  increased pain    Baseline Pain when walking > 300' with  rolling walker    Time 3   Period Weeks   Status New           PT Long Term Goals - 07/04/16 1145      PT LONG TERM GOAL #1   Title Patient will go up/down 8 steps with a reciprocol gait pattern in order to go up and down steps in her house    Baseline 8 steps in her house. Patient unable to get to her second story.    Time 6   Period Weeks   Status New     PT LONG TERM GOAL #2   Title Patient will ambulate 3000' with no device and no increase in pain in order to perfrom IADL's such as shopping    Time 6   Period Weeks   Status New     PT LONG TERM GOAL #3   Title Patient will be independent with HEP to improve strength and stability    Baseline No HEP    Time 6   Period Weeks   Status New     PT LONG TERM GOAL #4   Title Patient will dmesotrate a 46 % limitation on FOTO    Baseline 46% limitation on FOTO    Time 6   Period Weeks   Status New               Plan - 07/23/16 1157    Clinical Impression Statement Patient tolerated treatment well. She was given an updated HEP. She had no increase in pain with her HEP.    Rehab Potential Good   PT Frequency 2x / week   PT Duration 8 weeks   PT Treatment/Interventions ADLs/Self Care Home Management;Electrical Stimulation;Cryotherapy;Iontophoresis 4mg /ml Dexamethasone;Moist Heat;Ultrasound;Gait training;Stair training;Cognitive remediation;Therapeutic exercise;Therapeutic activities;Patient/family education;Manual techniques;Taping;Passive range of motion;Neuromuscular re-education   PT Next Visit Plan If patient is still using the cane review gait training in the bar. Continue with steps. Work on mini squats in a pain free range. Add LAQ, review HEP.    PT Home Exercise Plan quad sets, straight leg raise, standing weight shift, standing march with right leg    Consulted and Agree with Plan of Care Patient      Patient will benefit from skilled therapeutic intervention in order to improve the following deficits and  impairments:  Abnormal gait, Difficulty walking,  Decreased range of motion, Pain, Obesity, Decreased activity tolerance, Decreased strength, Increased edema  Visit Diagnosis: S/P arthroscopic partial medial meniscectomy  Acute pain of right knee  Difficulty in walking, not elsewhere classified  Muscle weakness (generalized)  Stiffness of right knee, not elsewhere classified     Problem List Patient Active Problem List   Diagnosis Date Noted  . Preeclampsia in postpartum period 12/09/2015  . S/P cesarean section 11/23/2015  . Twins 11/03/2015  . [redacted] weeks gestation of pregnancy   . Hereditary disease in family possibly affecting fetus, affecting management of mother, antepartum condition or complication   . Cesarean delivery, without mention of indication, delivered, with or without mention of antepartum condition 01/21/2014  . Excessive fetal growth 12/28/2013  . History of C-section 07/14/2013  . Hypothyroid 04/10/2013  . BMI 37.0-37.9, adult 04/10/2013  . Rickets 04/10/2013  . Family history of breast cancer in first degree relative 04/10/2013    Dessie Coma 07/23/2016, 8:46 PM  [facility] [address] [place], [place], [address] Phone: [phone]   Fax:  [phone]  Name: Julia Fowler MRN: [id number] Date of Birth: Sep 06, 1985

## 2016-07-25 ENCOUNTER — Ambulatory Visit: Payer: Medicaid Other | Admitting: Physical Therapy

## 2016-08-01 ENCOUNTER — Ambulatory Visit: Payer: Medicaid Other | Admitting: Physical Therapy

## 2016-08-01 ENCOUNTER — Encounter: Payer: Self-pay | Admitting: Physical Therapy

## 2016-08-01 DIAGNOSIS — M6281 Muscle weakness (generalized): Secondary | ICD-10-CM

## 2016-08-01 DIAGNOSIS — R262 Difficulty in walking, not elsewhere classified: Secondary | ICD-10-CM

## 2016-08-01 DIAGNOSIS — M25561 Pain in right knee: Secondary | ICD-10-CM

## 2016-08-01 DIAGNOSIS — Z9889 Other specified postprocedural states: Secondary | ICD-10-CM

## 2016-08-01 DIAGNOSIS — M25661 Stiffness of right knee, not elsewhere classified: Secondary | ICD-10-CM

## 2016-08-01 NOTE — Patient Instructions (Addendum)
Calf Stretch    Place one leg forward, bent, other leg behind and straight. Lean forward keeping back heel flat. Hold _30___ seconds while counting out loud. Repeat with other leg forward. Repeat _3___ times. Do _1___ sessions per day.  http://gt2.exer.us/478   Copyright  VHI. All rights reserved.  KNEE: Extension, Long Arc Quads - Sitting    Raise leg until knee is straight. _10__ reps per set, _1-2_ sets per day, _7_ days per week  Copyright  VHI. All rights reserved.  From exercise drawer: mini squats 1-2 x a day 10 x Step ups 1-2 x a day, 7 days a week work up to 30.   Stop when the form fail

## 2016-08-01 NOTE — Therapy (Signed)
[place], [place], [address] Phone: [phone]   Fax:  [phone]  Physical Therapy Treatment  Patient Details  Name: Julia Fowler MRN: [id number] Date of Birth: [birth year] Referring Provider: Dr Flossie Dibble   Encounter Date: 08/01/2016      PT End of Session - 08/01/16 1226    Visit Number 3   Number of Visits 4   Date for PT Re-Evaluation 08/15/16   Authorization - Visit Number 4   Authorization - Number of Visits 3   PT Start Time 1017   PT Stop Time 1106   PT Time Calculation (min) 49 min   Activity Tolerance Patient tolerated treatment well   Behavior During Therapy [facility] for tasks assessed/performed      Past Medical History:  Diagnosis Date  . Hypothyroidism    no medication  . Rickets   . Seasonal allergies   . Thyroid disease hypothyroid    Past Surgical History:  Procedure Laterality Date  . CESAREAN SECTION    . CESAREAN SECTION N/A 01/21/2014   Procedure: REPEAT CESAREAN SECTION;  Surgeon: Shelly Bombard, MD;  Location: [facility];  Service: Obstetrics;  Laterality: N/A;  . CESAREAN SECTION MULTI-GESTATIONAL N/A 11/23/2015   Procedure: CESAREAN SECTION MULTI-GESTATIONAL/TWINS;  Surgeon: Shelly Bombard, MD;  Location: [place];  Service: Obstetrics;  Laterality: N/A;  . TOOTH EXTRACTION Left     There were no vitals filed for this visit.      Subjective Assessment - 08/01/16 1023    Subjective [place] right now.  Left knee is not hurting any more.  Less pain in right knee now.   Currently in Pain? No/denies   Pain Score 3    Pain Orientation Right;Anterior   Pain Descriptors / Indicators Sharp   Pain Type Surgical pain   Pain Frequency Intermittent   Aggravating Factors  standing a long time.   Pain Relieving Factors ice rest   Effect of Pain on Daily Activities cane in community                         [facility] Adult PT Treatment/Exercise -  08/01/16 0001      Ambulation/Gait   Assistive device --  uses cane in community   Stairs Yes   Height of Stairs --  4, 6 inch steps,  2 rails,    Gait Comments Cues to avoid pounding on level and steps  I think she is doing the best she can considering all things     Knee/Hip Exercises: Standing   Terminal Knee Extension Limitations 6 x 3/10 pain with ball press in wall  cues   Forward Step Up Both;1 set;10 reps;Hand Hold: 2;Step Height: 6"   Functional Squat 10 reps   Functional Squat Limitations mini, pain free squats, HEP     Knee/Hip Exercises: Seated   Long Arc Quad 10 reps;Both  HEP     Knee/Hip Exercises: Supine   Quad Sets 10 reps  cues for technique,  poor quad contraction initially   Straight Leg Raises Limitations 10 x each     Modalities   Modalities Cryotherapy     Cryotherapy   Number Minutes Cryotherapy 8 Minutes   Cryotherapy Location Knee   Type of Cryotherapy --  cold pack     Manual Therapy   Manual therapy comments scar tissue mobs lateral knee  PT Education - 08/01/16 1112    Education provided Yes   Education Details HEP   Person(s) Educated Patient   Methods Explanation;Demonstration;Verbal cues;Handout   Comprehension Verbalized understanding;Returned demonstration          PT Short Term Goals - 08/01/16 1231      PT SHORT TERM GOAL #1   Title Patient will demsotrate full pain free knee flexion on the right    Time 3   Period Weeks   Status Unable to assess     PT SHORT TERM GOAL #2   Title Patient will increase gross right lower extremity strength to 4+/5   Time 3   Period Weeks   Status Unable to assess     PT SHORT TERM GOAL #3   Title Patient will be independent with HEP for intitial strengthening    Baseline independent with exercises issued so far.   Time 3   Period Weeks   Status Partially Met     PT SHORT TERM GOAL #4   Title Patient will ambulate 500' with least restrictive assistive  device withpout  increased pain    Time 3   Period Weeks   Status Unable to assess           PT Long Term Goals - 07/04/16 1145      PT LONG TERM GOAL #1   Title Patient will go up/down 8 steps with a reciprocol gait pattern in order to go up and down steps in her house    Baseline 8 steps in her house. Patient unable to get to her second story.    Time 6   Period Weeks   Status New     PT LONG TERM GOAL #2   Title Patient will ambulate 3000' with no device and no increase in pain in order to perfrom IADL's such as shopping    Time 6   Period Weeks   Status New     PT LONG TERM GOAL #3   Title Patient will be independent with HEP to improve strength and stability    Baseline No HEP    Time 6   Period Weeks   Status New     PT LONG TERM GOAL #4   Title Patient will dmesotrate a 46 % limitation on FOTO    Baseline 46% limitation on FOTO    Time 6   Period Weeks   Status New               Plan - 08/01/16 1227    Clinical Impression Statement Able to update her HEP.  Gait is improving, she uses cane in comminity and on soft grass., no cane inside.  Pain mild , intermittantly during session.  She needs more work with terminal knee control and quad strengthening.  STG#3 partially met.   PT Next Visit Plan Review exercises.  Progress as able.  ?  one more visit.   PT Home Exercise Plan quad sets, straight leg raise, standing weight shift, standing march with right leg , LAQ,  mini squats, step ups.   Consulted and Agree with Plan of Care Patient      Patient will benefit from skilled therapeutic intervention in order to improve the following deficits and impairments:  Abnormal gait, Difficulty walking, Decreased range of motion, Pain, Obesity, Decreased activity tolerance, Decreased strength, Increased edema  Visit Diagnosis: S/P arthroscopic partial medial meniscectomy  Acute pain of right knee  Difficulty in walking, not elsewhere  classified  Muscle  weakness (generalized)  Stiffness of right knee, not elsewhere classified     Problem List Patient Active Problem List   Diagnosis Date Noted  . Preeclampsia in postpartum period 12/09/2015  . S/P cesarean section 11/23/2015  . Twins 11/03/2015  . [redacted] weeks gestation of pregnancy   . Hereditary disease in family possibly affecting fetus, affecting management of mother, antepartum condition or complication   . Cesarean delivery, without mention of indication, delivered, with or without mention of antepartum condition 01/21/2014  . Excessive fetal growth 12/28/2013  . History of C-section 07/14/2013  . Hypothyroid 04/10/2013  . BMI 37.0-37.9, adult 04/10/2013  . Rickets 04/10/2013  . Family history of breast cancer in first degree relative 04/10/2013    [facility] PTA 08/01/2016, 12:34 PM  [place], [place], [address] Phone: [phone]   Fax:  [phone]  Name: JAMEISHA STOFKO MRN: [id number] Date of Birth: 07-18-1985

## 2016-08-08 ENCOUNTER — Ambulatory Visit: Payer: Medicaid Other | Admitting: Physical Therapy

## 2016-08-15 ENCOUNTER — Ambulatory Visit: Payer: Medicaid Other | Admitting: Physical Therapy

## 2016-08-20 ENCOUNTER — Ambulatory Visit: Payer: Medicaid Other | Attending: Orthopedic Surgery | Admitting: Physical Therapy

## 2016-08-20 ENCOUNTER — Encounter: Payer: Self-pay | Admitting: Physical Therapy

## 2016-08-20 DIAGNOSIS — M6281 Muscle weakness (generalized): Secondary | ICD-10-CM

## 2016-08-20 DIAGNOSIS — R262 Difficulty in walking, not elsewhere classified: Secondary | ICD-10-CM | POA: Diagnosis present

## 2016-08-20 DIAGNOSIS — M25561 Pain in right knee: Secondary | ICD-10-CM

## 2016-08-20 DIAGNOSIS — Z9889 Other specified postprocedural states: Secondary | ICD-10-CM | POA: Insufficient documentation

## 2016-08-20 DIAGNOSIS — M25661 Stiffness of right knee, not elsewhere classified: Secondary | ICD-10-CM | POA: Insufficient documentation

## 2016-08-20 NOTE — Therapy (Signed)
[place], [place], [address] Phone: [phone]   Fax:  [phone]  Physical Therapy Treatment  Patient Details  Name: Julia Fowler MRN: [id number] Date of Birth: January 12, 1986 Referring Provider: Dr Flossie Dibble   Encounter Date: 08/20/2016      PT End of Session - 08/20/16 0942    Visit Number 4   Number of Visits 4   Date for PT Re-Evaluation 08/15/16   Authorization Type Medicaid Menisectomy Eval +3    Authorization - Visit Number 4   Authorization - Number of Visits 4   PT Start Time 0932   PT Stop Time 1015   PT Time Calculation (min) 43 min   Activity Tolerance Patient tolerated treatment well   Behavior During Therapy [facility] for tasks assessed/performed      Past Medical History:  Diagnosis Date  . Hypothyroidism    no medication  . Rickets   . Seasonal allergies   . Thyroid disease hypothyroid    Past Surgical History:  Procedure Laterality Date  . CESAREAN SECTION    . CESAREAN SECTION N/A 01/21/2014   Procedure: REPEAT CESAREAN SECTION;  Surgeon: Shelly Bombard, MD;  Location: [facility];  Service: Obstetrics;  Laterality: N/A;  . CESAREAN SECTION MULTI-GESTATIONAL N/A 11/23/2015   Procedure: CESAREAN SECTION MULTI-GESTATIONAL/TWINS;  Surgeon: Shelly Bombard, MD;  Location: [place];  Service: Obstetrics;  Laterality: N/A;  . TOOTH EXTRACTION Left     There were no vitals filed for this visit.      Subjective Assessment - 08/20/16 0941    Subjective Patient reports her pain continues o improve. Agfter a shift of work she has some soreness in her leg but overall it is doing well. She is having some pain in her left leg. She has been back o work full time for 2 weeks.     Pertinent History Ricketts disease    Limitations Standing;Walking   How long can you sit comfortably? < 20 min    How long can you stand comfortably? < 3 min    How long can you walk comfortably? < 300  feet    Diagnostic tests Nothing post op    Patient Stated Goals To have less pain in her left knee    Currently in Pain? No/denies                         [facility] Adult PT Treatment/Exercise - 08/20/16 0001      Ambulation/Gait   Gait Comments Cues to avoid pounding on level and steps  I think she is doing the best she can considering all things     Knee/Hip Exercises: Aerobic   Nustep 5 min L3      Knee/Hip Exercises: Machines for Strengthening   Cybex Leg Press leg press 2x10 40 lbs no pain      Knee/Hip Exercises: Standing   Heel Raises Limitations 2x10    Knee Flexion Limitations 2x10   Forward Step Up Limitations 6" 2x10    Functional Squat 10 reps   Functional Squat Limitations mini, pain free squats, HEP   Other Standing Knee Exercises step onto air-ex x10      Knee/Hip Exercises: Seated   Long Arc Quad Limitations 2x10 yellow      Knee/Hip Exercises: Supine   Bridges Limitations 2x10   Straight Leg Raises Limitations 2x10      Modalities   Modalities Cryotherapy  Cryotherapy   Number Minutes Cryotherapy 10 Minutes   Cryotherapy Location Knee                PT Education - 08/20/16 0942    Education provided Yes   Education Details HEP    Person(s) Educated Patient   Methods Explanation;Demonstration;Verbal cues   Comprehension Verbalized understanding;Returned demonstration          PT Short Term Goals - 08/20/16 1318      PT SHORT TERM GOAL #1   Title Patient will demsotrate full pain free knee flexion on the right    Baseline full painfree range of motion today    Time 3   Period Weeks   Status Achieved     PT SHORT TERM GOAL #2   Title Patient will increase gross right lower extremity strength to 5/5   Time 3   Period Weeks   Status Achieved     PT SHORT TERM GOAL #3   Title Patient will be independent with HEP for intitial strengthening    Baseline independent with exercises issued so far.   Time 3   Period  Weeks   Status Achieved     PT SHORT TERM GOAL #4   Title Patient will ambulate 500' with least restrictive assistive device withpout  increased pain    Baseline walking community distances without significant pain    Time 3   Period Weeks   Status Achieved           PT Long Term Goals - 08/20/16 1319      PT LONG TERM GOAL #1   Title Patient will go up/down 8 steps with a reciprocol gait pattern in order to go up and down steps in her house    Baseline going up and down steps with minor discomfort at times    Time 6   Period Weeks   Status Achieved     PT LONG TERM GOAL #2   Title Patient will ambulate 3000' with no device and no increase in pain in order to perfrom IADL's such as shopping    Baseline ambulating without a devive without significant increase in pain    Time 6   Period Weeks     PT LONG TERM GOAL #3   Title Patient will be independent with HEP to improve strength and stability    Baseline perfroming HEP    Time 6   Period Weeks   Status Achieved     PT LONG TERM GOAL #4   Title Patient will dmesotrate a 46 % limitation on FOTO    Baseline 43% limitation on FOTO    Time 6   Period Weeks   Status Achieved        PHYSICAL THERAPY DISCHARGE SUMMARY  Visits from Start of Care:4  Current functional level related to goals / functional outcomes: Back to work. Very little pain   Remaining deficits: Slight pain at times   Education / Equipment: HEP Plan: Patient agrees to discharge.  Patient goals were met. Patient is being discharged due to meeting the stated rehab goals.  ?????            Plan - 08/20/16 0944    Clinical Impression Statement Patient has made good progress. She is not using the cane much at all. She still has an antalgic gait but that is likley baseline. She is having very little pain with daily activity. She has a full HEP. She has full  pain free range.,    Rehab Potential Good   PT Frequency 2x / week   PT Duration 8  weeks   PT Treatment/Interventions ADLs/Self Care Home Management;Electrical Stimulation;Cryotherapy;Iontophoresis 60m/ml Dexamethasone;Moist Heat;Ultrasound;Gait training;Stair training;Cognitive remediation;Therapeutic exercise;Therapeutic activities;Patient/family education;Manual techniques;Taping;Passive range of motion;Neuromuscular re-education   PT Next Visit Plan Review exercises.  Progress as able.  ?  one more visit.   PT Home Exercise Plan quad sets, straight leg raise, standing weight shift, standing march with right leg , LAQ,  mini squats, step ups.   Consulted and Agree with Plan of Care Patient      Patient will benefit from skilled therapeutic intervention in order to improve the following deficits and impairments:  Abnormal gait, Difficulty walking, Decreased range of motion, Pain, Obesity, Decreased activity tolerance, Decreased strength, Increased edema  Visit Diagnosis: S/P arthroscopic partial medial meniscectomy  Acute pain of right knee  Difficulty in walking, not elsewhere classified  Muscle weakness (generalized)  Stiffness of right knee, not elsewhere classified     Problem List Patient Active Problem List   Diagnosis Date Noted  . Preeclampsia in postpartum period 12/09/2015  . S/P cesarean section 11/23/2015  . Twins 11/03/2015  . [redacted] weeks gestation of pregnancy   . Hereditary disease in family possibly affecting fetus, affecting management of mother, antepartum condition or complication   . Cesarean delivery, without mention of indication, delivered, with or without mention of antepartum condition 01/21/2014  . Excessive fetal growth 12/28/2013  . History of C-section 07/14/2013  . Hypothyroid 04/10/2013  . BMI 37.0-37.9, adult 04/10/2013  . Rickets 04/10/2013  . Family history of breast cancer in first degree relative 04/10/2013    DCarney Living PT DPT  08/20/2016, 1:23 PM  [facility] [address]Phone: [phone]  Fax:  [phone] Name: Julia HATTABAUGHMRN: [id number] of Birth: 702-08-1985

## 2016-10-15 ENCOUNTER — Encounter (HOSPITAL_COMMUNITY): Payer: Self-pay | Admitting: *Deleted

## 2016-10-15 ENCOUNTER — Ambulatory Visit (HOSPITAL_COMMUNITY)
Admission: EM | Admit: 2016-10-15 | Discharge: 2016-10-15 | Disposition: A | Discharge status: Home / Self Care | Payer: Self-pay | Attending: Internal Medicine | Admitting: Internal Medicine

## 2016-10-15 DIAGNOSIS — E039 Hypothyroidism, unspecified: Secondary | ICD-10-CM | POA: Insufficient documentation

## 2016-10-15 DIAGNOSIS — Z803 Family history of malignant neoplasm of breast: Secondary | ICD-10-CM | POA: Insufficient documentation

## 2016-10-15 DIAGNOSIS — J029 Acute pharyngitis, unspecified: Secondary | ICD-10-CM | POA: Insufficient documentation

## 2016-10-15 LAB — POCT RAPID STREP A: Streptococcus, Group A Screen (Direct): NEGATIVE

## 2016-10-15 NOTE — ED Provider Notes (Signed)
CSN: [id number]     Arrival date & time 10/15/16  1136 History   First MD Initiated Contact with Patient 10/15/16 1346     Chief Complaint  Patient presents with  . Sore Throat   (Consider location/radiation/quality/duration/timing/severity/associated sxs/prior Treatment) 31 year old female complaining of a sore throat, odynophagia, bodyaches, PND, runny nose. Denies fever or chills. Symptoms started 2-3 days ago.      Past Medical History:  Diagnosis Date  . Hypothyroidism    no medication  . Rickets   . Seasonal allergies   . Thyroid disease hypothyroid   Past Surgical History:  Procedure Laterality Date  . CESAREAN SECTION    . CESAREAN SECTION N/A 01/21/2014   Procedure: REPEAT CESAREAN SECTION;  Surgeon: Brock Bad, MD;  Location: [facility];  Service: Obstetrics;  Laterality: N/A;  . CESAREAN SECTION MULTI-GESTATIONAL N/A 11/23/2015   Procedure: CESAREAN SECTION MULTI-GESTATIONAL/TWINS;  Surgeon: Brock Bad, MD;  Location: [facility];  Service: Obstetrics;  Laterality: N/A;  . TOOTH EXTRACTION Left    Family History  Problem Relation Age of Onset  . Cancer Sister     breast  . Diabetes Maternal Grandmother    Social History  Substance Use Topics  . Smoking status: Never Smoker  . Smokeless tobacco: Never Used  . Alcohol use No   OB History    Gravida Para Term Preterm AB Living   0 1 4   SAB TAB Ectopic Multiple Live Births   0 1 0 1 4     Review of Systems  Constitutional: Positive for fatigue. Negative for activity change, appetite change, chills and fever.  HENT: Positive for congestion, postnasal drip, rhinorrhea and sore throat. Negative for facial swelling and trouble swallowing.   Eyes: Negative.   Respiratory: Negative.   Cardiovascular: Negative.   Musculoskeletal: Negative for neck pain and neck stiffness.  Skin: Negative for pallor and rash.  Neurological: Negative.     Allergies  Other  Home Medications   Prior to  Admission medications   Medication Sig Start Date End Date Taking? Authorizing Provider  clindamycin (CLEOCIN-T) 1 % external solution Apply topically 2 (two) times daily. 07/12/16   Brock Bad, MD  HYDROcodone-acetaminophen (NORCO/VICODIN) 5-325 MG tablet Take 2 tablets by mouth every 4 (four) hours as needed. Patient not taking: Reported on 06/20/2016 01/31/16   Bethel Born, PA-C  Norethin Ace-Eth Estrad-FE (TAYTULLA) 1-20 MG-MCG(24) CAPS Take 1 capsule by mouth daily. 06/20/16   Brock Bad, MD  Prenat-FeCbn-FeAspGl-FA-Omega (OB COMPLETE PETITE) 35-5-1-200 MG CAPS Take 1 capsule by mouth at bedtime.    Historical Provider, MD  terconazole (TERAZOL 3) 0.8 % vaginal cream Place 1 applicator vaginally at bedtime. 07/04/16   Brock Bad, MD   Meds Ordered and Administered this Visit  Medications - No data to display  BP (!) 147/84 (BP Location: Left Arm) Comment: notified rn  Pulse 74   Temp 98.6 F (37 C) (Oral)   Resp 14   LMP 10/15/2016   SpO2 100%  No data found.   Physical Exam  Constitutional: She is oriented to person, place, and time. She appears well-developed and well-nourished. No distress.  HENT:  Bilateral TMs partially occluded by cerumen. TMs otherwise appear normal. Oropharynx minimally erythematous with slightly enlarged tonsils.  Eyes: EOM are normal.  Neck: Normal range of motion.  Cardiovascular: Normal rate.   Pulmonary/Chest: Effort normal.  Lymphadenopathy:    She has no cervical adenopathy.  Neurological: She is alert and oriented to person, place, and time.  Skin: Skin is warm and dry.  Nursing note and vitals reviewed.   Urgent Care Course     Procedures (including critical care time)  Labs Review Labs Reviewed  CULTURE, GROUP A STREP [facility])  POCT RAPID STREP A   Results for orders placed or performed during the hospital encounter of 10/15/16  POCT rapid strep A [facility])  Result Value Ref Range   Streptococcus,  Group A Screen (Direct) NEGATIVE NEGATIVE    Imaging Review No results found.   Visual Acuity Review  Right Eye Distance:   Left Eye Distance:   Bilateral Distance:    Right Eye Near:   Left Eye Near:    Bilateral Near:         MDM   1. Pharyngitis, unspecified etiology   2. Sore throat    Cepacol lozenges to help with sore throat pain. Take ibuprofen 600 mg every 6 hours as needed. Drink plenty of cool liquids. Take Zyrtec or Allegra as directed to minimize drainage in the back of the throat. The strep test was negative. He will be tested by culture and if he grows out strep within the next 3 days or so we will call you and be able to treat you over the telephone for that.     Hayden Rasmussen, NP 10/15/16 1356

## 2016-10-15 NOTE — Discharge Instructions (Signed)
Cepacol lozenges to help with sore throat pain. Take ibuprofen 600 mg every 6 hours as needed. Drink plenty of cool liquids. Take Zyrtec or Allegra as directed to minimize drainage in the back of the throat. The strep test was negative. He will be tested by culture and if he grows out strep within the next 3 days or so we will call you and be able to treat you over the telephone for that.

## 2016-10-15 NOTE — ED Triage Notes (Signed)
Pt  Reports  sorethroat  Body  Aches   Pain  When  She  Swallows   With  Symptoms  for   Several  Days     Headache   As   Well   With  Some  Nausea   As  Well

## 2016-10-17 LAB — CULTURE, GROUP A STREP (THRC)

## 2016-10-24 ENCOUNTER — Ambulatory Visit (INDEPENDENT_AMBULATORY_CARE_PROVIDER_SITE_OTHER): Payer: Medicaid Other | Admitting: Obstetrics

## 2016-10-24 ENCOUNTER — Other Ambulatory Visit (HOSPITAL_COMMUNITY)
Admission: RE | Admit: 2016-10-24 | Discharge: 2016-10-24 | Disposition: A | Discharge status: Home / Self Care | Payer: Medicaid Other | Source: Ambulatory Visit | Attending: Obstetrics | Admitting: Obstetrics

## 2016-10-24 ENCOUNTER — Encounter: Payer: Self-pay | Admitting: Obstetrics

## 2016-10-24 DIAGNOSIS — J301 Allergic rhinitis due to pollen: Secondary | ICD-10-CM

## 2016-10-24 DIAGNOSIS — Z01419 Encounter for gynecological examination (general) (routine) without abnormal findings: Secondary | ICD-10-CM

## 2016-10-24 DIAGNOSIS — D509 Iron deficiency anemia, unspecified: Secondary | ICD-10-CM

## 2016-10-24 DIAGNOSIS — Z113 Encounter for screening for infections with a predominantly sexual mode of transmission: Secondary | ICD-10-CM

## 2016-10-24 DIAGNOSIS — N760 Acute vaginitis: Secondary | ICD-10-CM | POA: Insufficient documentation

## 2016-10-24 DIAGNOSIS — B9689 Other specified bacterial agents as the cause of diseases classified elsewhere: Secondary | ICD-10-CM | POA: Insufficient documentation

## 2016-10-24 DIAGNOSIS — M25561 Pain in right knee: Secondary | ICD-10-CM

## 2016-10-24 DIAGNOSIS — Z803 Family history of malignant neoplasm of breast: Secondary | ICD-10-CM

## 2016-10-24 DIAGNOSIS — G8929 Other chronic pain: Secondary | ICD-10-CM

## 2016-10-24 DIAGNOSIS — Z0000 Encounter for general adult medical examination without abnormal findings: Secondary | ICD-10-CM

## 2016-10-24 DIAGNOSIS — N898 Other specified noninflammatory disorders of vagina: Secondary | ICD-10-CM

## 2016-10-24 MED ORDER — CITRANATAL BLOOM 90-1 MG PO TABS: 1.0000 | ORAL_TABLET | Freq: Every day | ORAL | 11 refills | Status: DC

## 2016-10-24 MED ORDER — LORATADINE 10 MG PO TABS: 10.0000 mg | ORAL_TABLET | Freq: Every day | ORAL | 11 refills | Status: DC

## 2016-10-24 NOTE — Progress Notes (Signed)
year Subjective:        Julia Fowler is a 31 y.o. female here for a routine exam.  Current complaints: Painful right knee..    Personal health questionnaire:  Is patient Ashkenazi Jewish, have a family history of breast and/or ovarian cancer: no Is there a family history of uterine cancer diagnosed at age < 38, gastrointestinal cancer, urinary tract cancer, family member who is a Field seismologist syndrome-associated carrier: no Is the patient overweight and hypertensive, family history of diabetes, personal history of gestational diabetes, preeclampsia or PCOS: no Is patient over 65, have PCOS,  family history of premature CHD under age 70, diabetes, smoke, have hypertension or peripheral artery disease:  no At any time, has a partner hit, kicked or otherwise hurt or frightened you?: no Over the past 2 weeks, have you felt down, depressed or hopeless?: no Over the past 2 weeks, have you felt little interest or pleasure in doing things?:no   Gynecologic History Patient's last menstrual period was 10/15/2016. Contraception: Nexplanon Last Pap: 2016. Results were: normal Last mammogram: n/a. Results were: n/a  Obstetric History OB History  Gravida Para Term Preterm AB Living  _0 0 1 4  SAB TAB Ectopic Multiple Live Births  0 1 0 1 4    # Outcome Date GA Lbr Len/2nd Weight Sex Delivery Anes PTL Lv  4A Term 11/23/15 [redacted]w[redacted]d 6 lb 2.9 oz (2.805 kg) F CS-LTranv Spinal  LIV     Birth Comments: No gross abnormalities seen.  4B Term 11/23/15 388w5d4 lb 11.1 oz (2.13 kg) M CS-LTranv Spinal  LIV     Birth Comments: No gross abnormalities seen.  3 Term 01/21/14 3935w0d lb 8.1 oz (3.405 kg) F CS-LTranv Spinal  LIV  2 TAB 06/27/09 14w63w0d Other  DEC     Birth Comments: abortion  1 Term 10/20/00 40w011w0db 7 oz (3.374 kg) M CS-Unspec EPI N LIV     Birth Comments: Did not progress past 3 cm      Past Medical History:  Diagnosis Date  . Hypothyroidism    no medication  . Rickets   .  Seasonal allergies   . Thyroid disease hypothyroid    Past Surgical History:  Procedure Laterality Date  . CESAREAN SECTION    . CESAREAN SECTION N/A 01/21/2014   Procedure: REPEAT CESAREAN SECTION;  Surgeon: CharlShelly Bombard  [place] [facility] [place]  Service: [place]stetrics;  Laterality: N/A;  . CESAREAN SECTION MULTI-GESTATIONAL N/A 11/23/2015   Procedure: CESAREAN SECTION MULTI-GESTATIONAL/TWINS;  Surgeon: CharlShelly Bombard  [facility] [place];  Laterality: N/A;  . TOOTH EXTRACTION Left      Current Outpatient Prescriptions:  .  meloxicam (MOBIC) 15 MG tablet, Take 15 mg by mouth daily., Disp: , Rfl:  .  clindamycin (CLEOCIN-T) 1 % external solution, Apply topically 2 (two) times daily. (Patient not taking: Reported on 10/24/2016), Disp: 30 mL, Rfl: 0 .  loratadine (CLARITIN) 10 MG tablet, Take 1 tablet (10 mg total) by mouth daily., Disp: 30 tablet, Rfl: 11 .  Prenat-FeCbn-FeAspGl-FA-Omega (OB COMPLETE PETITE) 35-5-1-200 MG CAPS, Take 1 capsule by mouth at bedtime., Disp: , Rfl:  .  Prenatal-DSS-FeCb-FeGl-FA (CITRANATAL BLOOM) 90-1 MG TABS, Take 1 tablet by mouth daily before breakfast., Disp: 30 tablet, Rfl: 11 .  terconazole (TERAZOL 3) 0.8 % vaginal cream, Place 1 applicator vaginally at bedtime. (Patient not taking: Reported on 10/24/2016),  Disp: 20 g, Rfl: 1 Allergies  Allergen Reactions  . Other Other (See Comments) and Cough    Pt states that she is allergic to cats/dogs.   Reaction:  Itchy, watery eyes     Social History  Substance Use Topics  . Smoking status: Never Smoker  . Smokeless tobacco: Never Used  . Alcohol use No    Family History  Problem Relation Age of Onset  . Cancer Sister     breast  . Diabetes Maternal Grandmother       Review of Systems  Constitutional: negative for fatigue and weight loss Respiratory: negative for cough and wheezing Cardiovascular: negative for chest pain, fatigue and  palpitations Gastrointestinal: negative for abdominal pain and change in bowel habits Musculoskeletal:negative for myalgias Neurological: negative for gait problems and tremors Behavioral/Psych: negative for abusive relationship, depression Endocrine: negative for temperature intolerance    Genitourinary:negative for abnormal menstrual periods, genital lesions, hot flashes, sexual problems and vaginal discharge Integument/breast: negative for breast lump, breast tenderness, nipple discharge and skin lesion(s)    Objective:       LMP 10/15/2016  General:   alert  Skin:   no rash or abnormalities  Lungs:   clear to auscultation bilaterally  Heart:   regular rate and rhythm, S1, S2 normal, no murmur, click, rub or gallop  Breasts:   normal without suspicious masses, skin or nipple changes or axillary nodes  Abdomen:  normal findings: no organomegaly, soft, non-tender and no hernia  Pelvis:  External genitalia: normal general appearance Urinary system: urethral meatus normal and bladder without fullness, nontender Vaginal: normal without tenderness, induration or masses Cervix: normal appearance Adnexa: normal bimanual exam Uterus: anteverted and non-tender, normal size   Lab Review Urine pregnancy test Labs reviewed yes Radiologic studies reviewed no  50% of 20 min visit spent on counseling and coordination of care.    Assessment:    Healthy female exam.    Vaginal discharge  Sister with early Breast CA  Chronic arthritis of knees  Seasonal allergies   Plan:   Wet prep and cultures done Mammogram and Genetic Counseling ordered Referred to Ortho Claritin Rx   Education reviewed: calcium supplements, depression evaluation, low fat, low cholesterol diet, safe sex/STD prevention, self breast exams and weight bearing exercise. Contraception: Nexplanon. Mammogram ordered. Follow up in: 1 yearBRCA genetic counseling RxGenetic Counseling for BRCA screening ordered   Meds  ordered this encounter  Medications  . meloxicam (MOBIC) 15 MG tablet    Sig: Take 15 mg by mouth daily.  . Prenatal-DSS-FeCb-FeGl-FA (CITRANATAL BLOOM) 90-1 MG TABS    Sig: Take 1 tablet by mouth daily before breakfast.    Dispense:  30 tablet    Refill:  11  . loratadine (CLARITIN) 10 MG tablet    Sig: Take 1 tablet (10 mg total) by mouth daily.    Dispense:  30 tablet    Refill:  11   Orders Placed This Encounter  Procedures  . MM Digital Screening    Standing Status:   Future    Standing Expiration Date:   12/24/2017    Order Specific Question:   Reason for Exam (SYMPTOM  OR DIAGNOSIS REQUIRED)    Answer:   Sister with early Breast CA    Order Specific Question:   Is the patient pregnant?    Answer:   No    Order Specific Question:   Preferred imaging location?    Answer:   [facility]  . HIV  antibody  . Hepatitis B surface antigen  . RPR  . Hepatitis C antibody  . Ambulatory referral to Orthopedics    Referral Priority:   Routine    Referral Type:   Consultation    Number of Visits Requested:   1  . Ambulatory referral to Genetics    Referral Priority:   Routine    Referral Type:   Consultation    Referral Reason:   Specialty Services Required    Number of Visits Requested:   1     Patient ID: Julia Fowler, female   DOB: 03-May-1986, 31 y.o.   MRN: [id number]

## 2016-10-24 NOTE — Progress Notes (Signed)
Last pap 04/2015- negative results. Pt states she would like STD screening. Pt has Nexplanon for [facility].

## 2016-10-25 LAB — CERVICOVAGINAL ANCILLARY ONLY
Bacterial vaginitis: NEGATIVE
CHLAMYDIA, DNA PROBE: NEGATIVE
Candida vaginitis: POSITIVE — AB
Neisseria Gonorrhea: NEGATIVE
TRICH (WINDOWPATH): NEGATIVE

## 2016-10-25 LAB — HEPATITIS C ANTIBODY

## 2016-10-25 LAB — RPR: RPR: NONREACTIVE

## 2016-10-25 LAB — HIV ANTIBODY (ROUTINE TESTING W REFLEX): HIV Screen 4th Generation wRfx: NONREACTIVE

## 2016-10-25 LAB — HEPATITIS B SURFACE ANTIGEN: Hepatitis B Surface Ag: NEGATIVE

## 2016-10-26 ENCOUNTER — Other Ambulatory Visit: Payer: Self-pay | Admitting: Obstetrics

## 2016-10-26 DIAGNOSIS — N760 Acute vaginitis: Secondary | ICD-10-CM

## 2016-10-26 LAB — CYTOLOGY - PAP
Adequacy: ABSENT
Diagnosis: NEGATIVE
HPV: NOT DETECTED

## 2016-10-26 MED ORDER — TERCONAZOLE 0.8 % VA CREA: 1.0000 | TOPICAL_CREAM | Freq: Every day | VAGINAL | 2 refills | Status: DC

## 2016-10-27 ENCOUNTER — Other Ambulatory Visit: Payer: Self-pay | Admitting: Obstetrics

## 2017-02-13 ENCOUNTER — Other Ambulatory Visit: Payer: Self-pay | Admitting: Obstetrics

## 2017-03-18 ENCOUNTER — Ambulatory Visit: Payer: Self-pay | Admitting: Obstetrics

## 2017-04-18 ENCOUNTER — Ambulatory Visit (INDEPENDENT_AMBULATORY_CARE_PROVIDER_SITE_OTHER): Payer: Medicaid Other | Admitting: Obstetrics

## 2017-04-18 ENCOUNTER — Encounter: Payer: Self-pay | Admitting: Obstetrics

## 2017-04-18 VITALS — BP 132/87 | HR 73 | Temp 98.7°F | Wt 194.3 lb

## 2017-04-18 DIAGNOSIS — N898 Other specified noninflammatory disorders of vagina: Secondary | ICD-10-CM

## 2017-04-18 DIAGNOSIS — N921 Excessive and frequent menstruation with irregular cycle: Secondary | ICD-10-CM

## 2017-04-18 DIAGNOSIS — Z309 Encounter for contraceptive management, unspecified: Secondary | ICD-10-CM | POA: Diagnosis not present

## 2017-04-18 DIAGNOSIS — Z975 Presence of (intrauterine) contraceptive device: Secondary | ICD-10-CM

## 2017-04-18 DIAGNOSIS — M549 Dorsalgia, unspecified: Secondary | ICD-10-CM

## 2017-04-18 DIAGNOSIS — N3001 Acute cystitis with hematuria: Secondary | ICD-10-CM

## 2017-04-18 MED ORDER — IBUPROFEN 800 MG PO TABS: 800.0000 mg | ORAL_TABLET | Freq: Three times a day (TID) | ORAL | 5 refills | Status: DC | PRN

## 2017-04-18 MED ORDER — CEPHALEXIN 500 MG PO CAPS: 500.0000 mg | ORAL_CAPSULE | Freq: Four times a day (QID) | ORAL | 0 refills | Status: DC

## 2017-04-18 MED ORDER — METRONIDAZOLE 500 MG PO TABS: 500.0000 mg | ORAL_TABLET | Freq: Two times a day (BID) | ORAL | 2 refills | Status: DC

## 2017-04-18 MED ORDER — TRAMADOL HCL 50 MG PO TABS: 50.0000 mg | ORAL_TABLET | Freq: Four times a day (QID) | ORAL | 2 refills | Status: DC | PRN

## 2017-04-18 MED ORDER — TERCONAZOLE 0.8 % VA CREA: 1.0000 | TOPICAL_CREAM | Freq: Every day | VAGINAL | 2 refills | Status: DC

## 2017-04-18 MED ORDER — FLUCONAZOLE 150 MG PO TABS: 150.0000 mg | ORAL_TABLET | Freq: Once | ORAL | 0 refills | Status: DC

## 2017-04-18 MED ORDER — FLUCONAZOLE 150 MG PO TABS: 150.0000 mg | ORAL_TABLET | Freq: Once | ORAL | 0 refills | Status: AC

## 2017-04-18 NOTE — Progress Notes (Addendum)
Subjective:    Julia Fowler is a 31 y.o. female who presents for contraception counseling. The patient had 2 periods last month. Has had backache and pain and dribbling with urination.  Also has malodorous fishy vaginal discharge. The patient is sexually active. Pertinent past medical history: none.  The information documented in the HPI was reviewed and verified.  Menstrual History: OB History    Gravida Para Term Preterm AB Living   0 1 4   SAB TAB Ectopic Multiple Live Births   0 1 0 1 4      Patient's last menstrual period was 03/11/2017.   Patient Active Problem List   Diagnosis Date Noted  . Preeclampsia in postpartum period 12/09/2015  . S/P cesarean section 11/23/2015  . Twins 11/03/2015  . [redacted] weeks gestation of pregnancy   . Hereditary disease in family possibly affecting fetus, affecting management of mother, antepartum condition or complication   . Cesarean delivery, without mention of indication, delivered, with or without mention of antepartum condition 01/21/2014  . Excessive fetal growth 12/28/2013  . History of C-section 07/14/2013  . Hypothyroid 04/10/2013  . BMI 37.0-37.9, adult 04/10/2013  . Rickets 04/10/2013  . Family history of breast cancer in first degree relative 04/10/2013   Past Medical History:  Diagnosis Date  . Hypothyroidism    no medication  . Rickets   . Seasonal allergies   . Thyroid disease hypothyroid    Past Surgical History:  Procedure Laterality Date  . CESAREAN SECTION    . CESAREAN SECTION N/A 01/21/2014   Procedure: REPEAT CESAREAN SECTION;  Surgeon: Brock Bad, MD;  [facility];  Service: Obstetrics;  Laterality: N/A;  . CESAREAN SECTION MULTI-GESTATIONAL N/A 11/23/2015   Procedure: CESAREAN SECTION MULTI-GESTATIONAL/TWINS;  Surgeon: Brock Bad, MD;  Location: [facility];  Service: Obstetrics;  Laterality: N/A;  . TOOTH EXTRACTION Left      Current Outpatient Prescriptions:  .  cephALEXin  (KEFLEX) 500 MG capsule, Take 1 capsule (500 mg total) by mouth 4 (four) times daily., Disp: 28 capsule, Rfl: 0 .  clindamycin (CLEOCIN-T) 1 % external solution, Apply topically 2 (two) times daily. (Patient not taking: Reported on 10/24/2016), Disp: 30 mL, Rfl: 0 .  fluconazole (DIFLUCAN) 150 MG tablet, Take 1 tablet (150 mg total) by mouth once., Disp: 1 tablet, Rfl: 0 .  ibuprofen (ADVIL,MOTRIN) 800 MG tablet, Take 1 tablet (800 mg total) by mouth every 8 (eight) hours as needed., Disp: 30 tablet, Rfl: 5 .  loratadine (CLARITIN) 10 MG tablet, Take 1 tablet (10 mg total) by mouth daily., Disp: 30 tablet, Rfl: 11 .  meloxicam (MOBIC) 15 MG tablet, Take 15 mg by mouth daily., Disp: , Rfl:  .  metroNIDAZOLE (FLAGYL) 500 MG tablet, Take 1 tablet (500 mg total) by mouth 2 (two) times daily., Disp: 14 tablet, Rfl: 2 .  Prenat-FeCbn-FeAspGl-FA-Omega (OB COMPLETE PETITE) 35-5-1-200 MG CAPS, Take 1 capsule by mouth at bedtime., Disp: , Rfl:  .  Prenatal-DSS-FeCb-FeGl-FA (CITRANATAL BLOOM) 90-1 MG TABS, Take 1 tablet by mouth daily before breakfast., Disp: 30 tablet, Rfl: 11 .  terconazole (TERAZOL 3) 0.8 % vaginal cream, Place 1 applicator vaginally at bedtime., Disp: 20 g, Rfl: 2 .  traMADol (ULTRAM) 50 MG tablet, Take 1-2 tablets (50-100 mg total) by mouth every 6 (six) hours as needed., Disp: 30 tablet, Rfl: 2 Allergies  Allergen ReactionCAITLAN CHAUCA Other (See Comments) and Cough    Pt states that  she is allergic to cats/dogs.   Reaction:  Itchy, watery eyes     Social History  Substance Use Topics  . Smoking status: Never Smoker  . Smokeless tobacco: Never Used  . Alcohol use No    Family History  Problem Relation Age of Onset  . Cancer Sister        breast  . Diabetes Maternal Grandmother        Review of Systems Constitutional: negative for weight loss Genitourinary:positive for abnormal menstrual periods, vaginal discharge and painful urination   Objective:   BP 132/87   Pulse  73   Temp 98.7 F (37.1 C)   Wt 194 lb 4.8 oz (88.1 kg)   LMP 03/11/2017   Breastfeeding? No Comment: 2nd period 03/26/17  BMI 39.24 kg/m    PE:  Deferred   Lab Review Urine pregnancy test Labs reviewed yes Radiologic studies reviewed no  >50% of 10 min visit spent on counseling and coordination of care.    Assessment and Plan:    31 y.o., continuing Nexplanon, no contraindications  1. Breakthrough bleeding on Nexplanon - hormonal imbalance.  Will follow conservatively.  2. Acute cystitis with hematuria Rx: - cephALEXin (KEFLEX) 500 MG capsule; Take 1 capsule (500 mg total) by mouth 4 (four) times daily.  Dispense: 28 capsule; Refill: 0  3. Backache symptom Rx: - ibuprofen (ADVIL,MOTRIN) 800 MG tablet; Take 1 tablet (800 mg total) by mouth every 8 (eight) hours as needed.  Dispense: 30 tablet; Refill: 5 - traMADol (ULTRAM) 50 MG tablet; Take 1-2 tablets (50-100 mg total) by mouth every 6 (six) hours as needed.  Dispense: 30 tablet; Refill: 2  4. Vaginal discharge with fishy odor.   Probable BV. Rx: - metroNIDAZOLE (FLAGYL) 500 MG tablet; Take 1 tablet (500 mg total) by mouth 2 (two) times daily.  Dispense: 14 tablet; Refill: 2 - fluconazole (DIFLUCAN) 150 MG tablet; Take 1 tablet (150 mg total) by mouth once.  Dispense: 1 tablet; Refill: 0  Plan:    All questions answered. Diagnosis explained in detail, including differential. Discussed healthy lifestyle modifications. Follow up in 7 months. Follow up as needed. Urinalysis.  Hematuria  Meds ordered this encounter  Medications  . DISCONTD: cephALEXin (KEFLEX) 500 MG capsule    Sig: Take 1 capsule (500 mg total) by mouth 4 (four) times daily.    Dispense:  28 capsule    Refill:  0  . DISCONTD: fluconazole (DIFLUCAN) 150 MG tablet    Sig: Take 1 tablet (150 mg total) by mouth once.    Dispense:  1 tablet    Refill:  0  . DISCONTD: metroNIDAZOLE (FLAGYL) 500 MG tablet    Sig: Take 1 tablet (500 mg total) by mouth  2 (two) times daily.    Dispense:  14 tablet    Refill:  2  . ibuprofen (ADVIL,MOTRIN) 800 MG tablet    Sig: Take 1 tablet (800 mg total) by mouth every 8 (eight) hours as needed.    Dispense:  30 tablet    Refill:  5  . traMADol (ULTRAM) 50 MG tablet    Sig: Take 1-2 tablets (50-100 mg total) by mouth every 6 (six) hours as needed.    Dispense:  30 tablet    Refill:  2  . terconazole (TERAZOL 3) 0.8 % vaginal cream    Sig: Place 1 applicator vaginally at bedtime.    Dispense:  20 g    Refill:  2  . metroNIDAZOLE (FLAGYL)  500 MG tablet    Sig: Take 1 tablet (500 mg total) by mouth 2 (two) times daily.    Dispense:  14 tablet    Refill:  2  . fluconazole (DIFLUCAN) 150 MG tablet    Sig: Take 1 tablet (150 mg total) by mouth once.    Dispense:  1 tablet    Refill:  0  . cephALEXin (KEFLEX) 500 MG capsule    Sig: Take 1 capsule (500 mg total) by mouth 4 (four) times daily.    Dispense:  28 capsule    Refill:  0   No orders of the defined types were placed in this encounter.   Follow up as needed.

## 2017-04-18 NOTE — Progress Notes (Signed)
Presents for AUB with Nexplanon, she had 2 periods last month; 9/3-11 and 9/18-29. Complains of lower back and abdominal pain x7-14 days.  NV x7 days. Fishy odor, tingling urination and difficulty peeing. Denies burning.

## 2017-05-23 ENCOUNTER — Emergency Department (HOSPITAL_COMMUNITY)
Admission: EM | Admit: 2017-05-23 | Discharge: 2017-05-23 | Disposition: A | Discharge status: Home / Self Care | Payer: Medicaid Other | Attending: Physician Assistant | Admitting: Physician Assistant

## 2017-05-23 ENCOUNTER — Other Ambulatory Visit: Payer: Self-pay

## 2017-05-23 ENCOUNTER — Encounter (HOSPITAL_COMMUNITY): Payer: Self-pay | Admitting: *Deleted

## 2017-05-23 DIAGNOSIS — R110 Nausea: Secondary | ICD-10-CM | POA: Insufficient documentation

## 2017-05-23 DIAGNOSIS — J069 Acute upper respiratory infection, unspecified: Secondary | ICD-10-CM | POA: Insufficient documentation

## 2017-05-23 DIAGNOSIS — E039 Hypothyroidism, unspecified: Secondary | ICD-10-CM | POA: Insufficient documentation

## 2017-05-23 DIAGNOSIS — J029 Acute pharyngitis, unspecified: Secondary | ICD-10-CM | POA: Insufficient documentation

## 2017-05-23 DIAGNOSIS — Z20828 Contact with and (suspected) exposure to other viral communicable diseases: Secondary | ICD-10-CM | POA: Insufficient documentation

## 2017-05-23 DIAGNOSIS — R0981 Nasal congestion: Secondary | ICD-10-CM | POA: Insufficient documentation

## 2017-05-23 DIAGNOSIS — Z803 Family history of malignant neoplasm of breast: Secondary | ICD-10-CM | POA: Insufficient documentation

## 2017-05-23 DIAGNOSIS — Z79899 Other long term (current) drug therapy: Secondary | ICD-10-CM | POA: Insufficient documentation

## 2017-05-23 MED ORDER — FLUTICASONE PROPIONATE 50 MCG/ACT NA SUSP: 1.0000 | Freq: Every day | NASAL | 0 refills | Status: DC

## 2017-05-23 MED ORDER — HYDROXYZINE HCL 25 MG PO TABS: 25.0000 mg | ORAL_TABLET | Freq: Two times a day (BID) | ORAL | 0 refills | Status: DC | PRN

## 2017-05-23 NOTE — Discharge Instructions (Signed)
You likely have a viral illness.  You should treat this symptomatically. Use Flonase for nasal congestion and cough. Use Tylenol or ibuprofen as needed for pain or fever. Make sure to stay well-hydrated with water. Wash your hands frequently to prevent spread of infection. You may try Vistaril as needed for anxiety. You need to follow up with your primary care doctor regarding anxiety treatment and management.  Return to the emergency room if you develop persistent high fevers despite medication, difficulty breathing, persistent chest pain, or any new or worsening symptoms.

## 2017-05-23 NOTE — ED Provider Notes (Signed)
[facility] EMERGENCY DEPARTMENT Provider Note   CSN: [id number] Arrival date & time: 05/23/17  1243     History   Chief Complaint Chief Complaint  Patient presents with  . Headache    HPI Julia Fowler is a 31 y.o. female presenting with left-sided headache, nasal congestion, and sore throat beginning last night.  Patient states that her symptoms began last night.  When she woke up this morning, she had persistence of symptoms.  Her headache is on the left side, and described as throbbing.  It is worse in her forehead and by her cheek.  She has associated nasal congestion which began last night.  She states that she is coughing, it is nonproductive.  She denies chest pain or shortness of breath.  She denies difficulty swallowing or trismus.  She denies fevers, chills, abdominal pain, urinary symptoms, abnormal bowel movements, or vomiting.  She states that she is feeling nauseous, but has had no vomiting.  She was able to tolerate p.o. intake this morning without difficulty.  She has not taken anything for her symptoms.  She states that 4 of her children at home are sick with viral symptoms. Additionally, patient states that she is feeling anxious.  She has been feeling anxious daily for a while, unable to quantify how long.  She has not talked her primary care doctor about this.  She denies SI, HI, or AVH.   HPI  Past Medical History:  Diagnosis Date  . Hypothyroidism    no medication  . Rickets   . Seasonal allergies   . Thyroid disease hypothyroid    Patient Active Problem List   Diagnosis Date Noted  . Preeclampsia in postpartum period 12/09/2015  . S/P cesarean section 11/23/2015  . Twins 11/03/2015  . [redacted] weeks gestation of pregnancy   . Hereditary disease in family possibly affecting fetus, affecting management of mother, antepartum condition or complication   . Cesarean delivery, without mention of indication, delivered, with or without mention of  antepartum condition 01/21/2014  . Excessive fetal growth 12/28/2013  . History of C-section 07/14/2013  . Hypothyroid 04/10/2013  . BMI 37.0-37.9, adult 04/10/2013  . Rickets 04/10/2013  . Family history of breast cancer in first degree relative 04/10/2013    Past Surgical History:  Procedure Laterality Date  . CESAREAN SECTION    . CESAREAN SECTION N/A 01/21/2014   Procedure: REPEAT CESAREAN SECTION;  Surgeon: Brock Bad, MD;  Location: [facility];  Service: Obstetrics;  Laterality: N/A;  . CESAREAN SECTION MULTI-GESTATIONAL N/A 11/23/2015   Procedure: CESAREAN SECTION MULTI-GESTATIONAL/TWINS;  Surgeon: Brock Bad, MD;  Location: [facility] SU[facility];  Service: Obstetrics;  Laterality: N/A;  . TOOTH EXTRACTION Left     OB History    Gravida Para Term Preterm AB Living   4 3 3  0 1 4   SAB TAB Ectopic Multiple Live Births   0 1 0 1 4       Home Medications    Prior to Admission medications   Medication Sig Start Date End Date Taking? Authorizing Provider  cephALEXin (KEFLEX) 500 MG capsule Take 1 capsule (500 mg total) by mouth 4 (four) times daily. 04/18/17   Brock Bad, MD  clindamycin (CLEOCIN-T) 1 % external solution Apply topically 2 (two) times daily. Patient not taking: Reported on 10/24/2016 07/12/16   Brock Bad, MD  fluticasone [facility]) 50 MCG/ACT nasal spray Place 1 spray daily into both nostrils. 05/23/17   Dynasti Kerman,  Taffy Delconte, PA-C  hydrOXYzine (ATARAX/VISTARIL) 25 MG tablet Take 1 tablet (25 mg total) 2 (two) times daily as needed by mouth. 05/23/17   Allen Egerton, PA-C  ibuprofen (ADVIL,MOTRIN) 800 MG tablet Take 1 tablet (800 mg total) by mouth every 8 (eight) hours as needed. 04/18/17   Brock BadHarper, Charles A, MD  loratadine (CLARITIN) 10 MG tablet Take 1 tablet (10 mg total) by mouth daily. 10/24/16   Brock BadHarper, Charles A, MD  meloxicam (MOBIC) 15 MG tablet Take 15 mg by mouth daily.    [provider]  metroNIDAZOLE (FLAGYL) 500 MG tablet  Take 1 tablet (500 mg total) by mouth 2 (two) times daily. 04/18/17   Brock BadHarper, Charles A, MD  Prenat-FeCbn-FeAspGl-FA-Omega (OB COMPLETE PETITE) 35-5-1-200 MG CAPS Take 1 capsule by mouth at bedtime.    [provider]  Prenatal-DSS-FeCb-FeGl-FA Cherlyn Labella(CITRANATAL BLOOM) 90-1 MG TABS Take 1 tablet by mouth daily before breakfast. 10/24/16   Brock BadHarper, Charles A, MD  terconazole (TERAZOL 3) 0.8 % vaginal cream Place 1 applicator vaginally at bedtime. 04/18/17   Brock BadHarper, Charles A, MD  traMADol (ULTRAM) 50 MG tablet Take 1-2 tablets (50-100 mg total) by mouth every 6 (six) hours as needed. 04/18/17   Brock BadHarper, Charles A, MD    Family History Family History  Problem Relation Age of Onset  . Cancer Sister        breast  . Diabetes Maternal Grandmother     Social History Social History   Tobacco Use  . Smoking status: Never Smoker  . Smokeless tobacco: Never Used  Substance Use Topics  . Alcohol use: No  . Drug use: No     Allergies   Other   Review of Systems Review of Systems  Constitutional: Negative for chills and fever.  HENT: Positive for congestion, sinus pressure and sore throat.   Eyes: Negative for pain.  Respiratory: Positive for cough. Negative for chest tightness and shortness of breath.   Cardiovascular: Negative for chest pain.  Gastrointestinal: Positive for nausea. Negative for abdominal pain and vomiting.     Physical Exam Updated Vital Signs BP 124/84 (BP Location: Right Arm)   Pulse 87   Temp (!) 97.3 F (36.3 C) (Oral)   Resp 16   LMP 05/20/2017 (Exact Date)   SpO2 99%   Physical Exam  Constitutional: She is oriented to person, place, and time. She appears well-developed and well-nourished. No distress.  HENT:  Head: Normocephalic and atraumatic.  Right Ear: Tympanic membrane, external ear and ear canal normal.  Left Ear: Tympanic membrane, external ear and ear canal normal.  Nose: Mucosal edema present. Right sinus exhibits maxillary sinus  tenderness. Left sinus exhibits maxillary sinus tenderness.  Mouth/Throat: Uvula is midline, oropharynx is clear and moist and mucous membranes are normal. No tonsillar exudate.  Left-sided frontal maxillary sinus pressure. Nasal mucosal edema. OP clear without tonsillar swelling or exudate.   Eyes: Conjunctivae and EOM are normal. Pupils are equal, round, and reactive to light.  Neck: Normal range of motion.  Cardiovascular: Normal rate, regular rhythm and intact distal pulses.  Pulmonary/Chest: Effort normal and breath sounds normal. No respiratory distress. She has no decreased breath sounds. She has no wheezes. She has no rhonchi. She has no rales.  Pt speaking in full sentences.  Clear lung sounds in all fields  Abdominal: Soft. She exhibits no distension. There is no tenderness.  Musculoskeletal: Normal range of motion.  Strength intact x4.  Sensation intact x4.  Radial pedal pulses equal bilaterally.  Pt ambulatory without difficulty  Lymphadenopathy:    She has no cervical adenopathy.  Neurological: She is alert and oriented to person, place, and time.  Skin: Skin is warm.  Psychiatric: She has a normal mood and affect.  Nursing note and vitals reviewed.    ED Treatments / Results  Labs (all labs ordered are listed, but only abnormal results are displayed) Labs Reviewed - No data to display  EKG  EKG Interpretation None       Radiology No results found.  Procedures Procedures (including critical care time)  Medications Ordered in ED Medications - No data to display   Initial Impression / Assessment and Plan / ED Course  I have reviewed the triage vital signs and the nursing notes.  Pertinent labs & imaging results that were available during my care of the patient were reviewed by me and considered in my medical decision making (see chart for details).     She presented with viral URI/sinusitis sxs.  Physical exam reassuring, she is afebrile not tachycardic.   She does not appear toxic.  Patient is concerned about stroke, but neurologic exam without gross deficits, doubt stroke.  Likely viral illness.  Will treat with Flonase and hydration.  Atarax given for anxiety, patient instructed to follow-up with her primary care doctor.  At this time, patient appears safe for discharge.  Return precautions given.  Patient states she understands and agrees to plan.   Final Clinical Impressions(s) / ED Diagnoses   Final diagnoses:  Upper respiratory tract infection, unspecified type    ED Discharge Orders        Ordered    fluticasone (FLONASE) 50 MCG/ACT nasal spray  Daily     05/23/17 1427    hydrOXYzine (ATARAX/VISTARIL) 25 MG tablet  2 times daily PRN     05/23/17 1427       Alven Alverio, PA-C 05/23/17 1759    Abelino DerrickMackuen, Courteney Lyn, MD 05/26/17 1624

## 2017-05-23 NOTE — ED Triage Notes (Signed)
To ED for eval of headache since waking this am and congestion since last night. Nausea but no vomiting.

## 2017-05-23 NOTE — ED Notes (Signed)
Pt verbalized understanding of discharge instructions and denies any further questions at this time.   

## 2017-05-28 ENCOUNTER — Telehealth: Payer: Self-pay

## 2017-05-28 DIAGNOSIS — B379 Candidiasis, unspecified: Secondary | ICD-10-CM

## 2017-05-28 MED ORDER — FLUCONAZOLE 150 MG PO TABS: 150.0000 mg | ORAL_TABLET | Freq: Once | ORAL | 0 refills | Status: AC

## 2017-05-28 NOTE — Telephone Encounter (Signed)
Pt called and left message requesting Diflucan. Pt just completed Flagyl and has vaginal itching .Per protocol Rx sent to pharmacy

## 2017-06-28 ENCOUNTER — Encounter (HOSPITAL_COMMUNITY): Payer: Self-pay | Admitting: Emergency Medicine

## 2017-06-28 ENCOUNTER — Emergency Department (HOSPITAL_COMMUNITY)
Admission: EM | Admit: 2017-06-28 | Discharge: 2017-06-28 | Disposition: A | Discharge status: Home / Self Care | Payer: Medicaid Other | Attending: Emergency Medicine | Admitting: Emergency Medicine

## 2017-06-28 DIAGNOSIS — M545 Low back pain, unspecified: Secondary | ICD-10-CM

## 2017-06-28 DIAGNOSIS — E039 Hypothyroidism, unspecified: Secondary | ICD-10-CM | POA: Insufficient documentation

## 2017-06-28 DIAGNOSIS — Z79899 Other long term (current) drug therapy: Secondary | ICD-10-CM | POA: Insufficient documentation

## 2017-06-28 DIAGNOSIS — M533 Sacrococcygeal disorders, not elsewhere classified: Secondary | ICD-10-CM

## 2017-06-28 MED ORDER — OXYCODONE-ACETAMINOPHEN 5-325 MG PO TABS
1.0000 | ORAL_TABLET | Freq: Once | ORAL | Status: AC
  Administered 2017-06-28: 1 via ORAL
  Filled 2017-06-28: qty 1

## 2017-06-28 MED ORDER — CYCLOBENZAPRINE HCL 10 MG PO TABS
10.0000 mg | ORAL_TABLET | Freq: Once | ORAL | Status: AC
  Administered 2017-06-28: 10 mg via ORAL
  Filled 2017-06-28: qty 1

## 2017-06-28 MED ORDER — IBUPROFEN 400 MG PO TABS
600.0000 mg | ORAL_TABLET | Freq: Once | ORAL | Status: AC
  Administered 2017-06-28: 600 mg via ORAL
  Filled 2017-06-28: qty 1

## 2017-06-28 MED ORDER — OXYCODONE-ACETAMINOPHEN 5-325 MG PO TABS: 1.0000 | ORAL_TABLET | ORAL | 0 refills | Status: DC | PRN

## 2017-06-28 MED ORDER — CYCLOBENZAPRINE HCL 10 MG PO TABS: 10.0000 mg | ORAL_TABLET | Freq: Three times a day (TID) | ORAL | 0 refills | Status: DC | PRN

## 2017-06-28 NOTE — ED Notes (Signed)
C/o pain in her lower back and tailbone, denies injury.

## 2017-06-28 NOTE — Discharge Instructions (Signed)
Apply ice several times a day. Take 3 ibuprofen tablets at a time, 3-4 times a day. Return if symptoms are getting worse.

## 2017-06-28 NOTE — ED Triage Notes (Signed)
Pt reports tailbone pain X2 days and lower back pain, pt has been sitting upstairs with her son and thinks the two are related. Denies GU S/S

## 2017-06-28 NOTE — ED Provider Notes (Signed)
[facility] Provider Note   CSN: [id number] Arrival date & time: 06/28/17  0403     History   Chief Complaint Chief Complaint  Patient presents with  . Tailbone Pain    HPI Julia Fowler is a 31 y.o. female.  The history is provided by the patient.  She complains of pain in her lower back and her tailbone for the last 2 days.  She denies any history of trauma and denies any unusual bending, lifting, twisting, etc.  Pain is severe and gets as high as 10/10 if she tries to bear weight or if she puts pressure on her tailbone area.  She is unable to sit because of pain.  She denies fever or chills.  She denies any weakness, numbness, tingling.  She denies bowel or bladder dysfunction.  She has been taking ibuprofen which has not been giving her relief.  She denies prior back problems.  Past Medical History:  Diagnosis Date  . Hypothyroidism    no medication  . Rickets   . Seasonal allergies   . Thyroid disease hypothyroid    Patient Active Problem List   Diagnosis Date Noted  . Preeclampsia in postpartum period 12/09/2015  . S/P cesarean section 11/23/2015  . Twins 11/03/2015  . [redacted] weeks gestation of pregnancy   . Hereditary disease in family possibly affecting fetus, affecting management of mother, antepartum condition or complication   . Cesarean delivery, without mention of indication, delivered, with or without mention of antepartum condition 01/21/2014  . Excessive fetal growth 12/28/2013  . History of C-section 07/14/2013  . Hypothyroid 04/10/2013  . BMI 37.0-37.9, adult 04/10/2013  . Rickets 04/10/2013  . Family history of breast cancer in first degree relative 04/10/2013    Past Surgical History:  Procedure Laterality Date  . CESAREAN SECTION    . CESAREAN SECTION N/A 01/21/2014   Procedure: REPEAT CESAREAN SECTION;  Surgeon: Brock Badharles A Harper, MD;  Location: [facility];  Service: Obstetrics;  Laterality: N/A;  . CESAREAN  SECTION MULTI-GESTATIONAL N/A 11/23/2015   Procedure: CESAREAN SECTION MULTI-GESTATIONAL/TWINS;  Surgeon: Brock Badharles A Harper, MD;  [facility];  Service: Obstetrics;  Laterality: N/A;  . TOOTH EXTRACTION Left     OB History    Gravida Para Term Preterm AB Living   4 3 3  0 1 4   SAB TAB Ectopic Multiple Live Births   0 1 0 1 4       Home Medications    Prior to Admission medications   Medication Sig Start Date End Date Taking? Authorizing Provider  cephALEXin (KEFLEX) 500 MG capsule Take 1 capsule (500 mg total) by mouth 4 (four) times daily. 04/18/17   Brock BadHarper, Charles A, MD  clindamycin (CLEOCIN-T) 1 % external solution Apply topically 2 (two) times daily. Patient not taking: Reported on 10/24/2016 07/12/16   Brock BadHarper, Charles A, MD  fluticasone [facility](FLONASE) 50 MCG/ACT nasal spray Place 1 spray daily into both nostrils. 05/23/17   Caccavale, Sophia, PA-C  hydrOXYzine (ATARAX/VISTARIL) 25 MG tablet Take 1 tablet (25 mg total) 2 (two) times daily as needed by mouth. 05/23/17   Caccavale, Sophia, PA-C  ibuprofen (ADVIL,MOTRIN) 800 MG tablet Take 1 tablet (800 mg total) by mouth every 8 (eight) hours as needed. 04/18/17   Brock BadHarper, Charles A, MD  loratadine (CLARITIN) 10 MG tablet Take 1 tablet (10 mg total) by mouth daily. 10/24/16   Brock BadHarper, Charles A, MD  meloxicam (MOBIC) 15 MG tablet Take 15  mg by mouth daily.    [provider]  metroNIDAZOLE (FLAGYL) 500 MG tablet Take 1 tablet (500 mg total) by mouth 2 (two) times daily. 04/18/17   Brock BadHarper, Charles A, MD  Prenat-FeCbn-FeAspGl-FA-Omega (OB COMPLETE PETITE) 35-5-1-200 MG CAPS Take 1 capsule by mouth at bedtime.    [provider]  Prenatal-DSS-FeCb-FeGl-FA Cherlyn Labella(CITRANATAL BLOOM) 90-1 MG TABS Take 1 tablet by mouth daily before breakfast. 10/24/16   Brock BadHarper, Charles A, MD  terconazole (TERAZOL 3) 0.8 % vaginal cream Place 1 applicator vaginally at bedtime. 04/18/17   Brock BadHarper, Charles A, MD  traMADol (ULTRAM) 50 MG tablet  Take 1-2 tablets (50-100 mg total) by mouth every 6 (six) hours as needed. 04/18/17   Brock BadHarper, Charles A, MD    Family History Family History  Problem Relation Age of Onset  . Cancer Sister        breast  . Diabetes Maternal Grandmother     Social History Social History   Tobacco Use  . Smoking status: Never Smoker  . Smokeless tobacco: Never Used  Substance Use Topics  . Alcohol use: No  . Drug use: No     Allergies   Other   Review of Systems Review of Systems  All other systems reviewed and are negative.    Physical Exam Updated Vital Signs BP 122/74 (BP Location: Right Arm)   Pulse 85   Temp 98.3 F (36.8 C) (Oral)   Resp 18   Ht 4\' 9"  (1.448 m)   Wt 86.2 kg (190 lb)   SpO2 100%   BMI 41.12 kg/m   Physical Exam  Nursing note and vitals reviewed.  31 year old female, resting comfortably and in no acute distress. Vital signs are normal. Oxygen saturation is 100%, which is normal. Head is normocephalic and atraumatic. PERRLA, EOMI. Oropharynx is clear. Neck is nontender and supple without adenopathy or JVD. Back is tender in the mid and lower lumbar area and into the sacral and coccygeal area.  There is no erythema and no rash.  There is moderate bilateral paralumbar spasm with tenderness.  Straight leg raise is positive bilaterally at 45 degrees.  There is no CVA tenderness. Lungs are clear without rales, wheezes, or rhonchi. Chest is nontender. Heart has regular rate and rhythm without murmur. Abdomen is soft, flat, nontender without masses or hepatosplenomegaly and peristalsis is normoactive. Extremities have no cyanosis or edema, full range of motion is present. Skin is warm and dry without rash. Neurologic: Mental status is normal, cranial nerves are intact, there are no motor or sensory deficits.   ED Treatments / Results   Procedures Procedures (including critical care time)  Medications Ordered in ED Medications - No data to  display   Initial Impression / Assessment and Plan / ED Course  I have reviewed the triage vital signs and the nursing notes.  Lower back and sacrococcygeal pain of uncertain cause.  Given straight leg raise test being positive, suspect radicular pain.  No red flags to suggest more serious illness.  No indication for imaging today.  She states she is unable to take naproxen, but can take ibuprofen.  She is advised to take ibuprofen and is given prescriptions for cyclobenzaprine and oxycodone-acetaminophen.  Follow-up with PCP.  Return precautions discussed.  Final Clinical Impressions(s) / ED Diagnoses   Final diagnoses:  Acute midline low back pain without sciatica  Coccydynia    ED Discharge Orders        Ordered    cyclobenzaprine (  FLEXERIL) 10 MG tablet  3 times daily PRN     06/28/17 0645    oxyCODONE-acetaminophen (PERCOCET) 5-325 MG tablet  Every 4 hours PRN     06/28/17 0645       Dione Booze, MD 06/28/17 [phone]

## 2017-07-10 ENCOUNTER — Other Ambulatory Visit: Payer: Self-pay | Admitting: Obstetrics

## 2017-07-10 DIAGNOSIS — N898 Other specified noninflammatory disorders of vagina: Secondary | ICD-10-CM

## 2017-07-11 ENCOUNTER — Other Ambulatory Visit: Payer: Self-pay | Admitting: Obstetrics

## 2017-07-18 ENCOUNTER — Other Ambulatory Visit: Payer: Self-pay

## 2017-07-18 ENCOUNTER — Telehealth: Payer: Self-pay

## 2017-07-18 DIAGNOSIS — N3001 Acute cystitis with hematuria: Secondary | ICD-10-CM

## 2017-07-18 DIAGNOSIS — M549 Dorsalgia, unspecified: Secondary | ICD-10-CM

## 2017-07-18 MED ORDER — TRAMADOL HCL 50 MG PO TABS: 50.0000 mg | ORAL_TABLET | Freq: Four times a day (QID) | ORAL | 2 refills | Status: DC | PRN

## 2017-07-18 MED ORDER — CEPHALEXIN 500 MG PO CAPS
500.0000 mg | ORAL_CAPSULE | Freq: Four times a day (QID) | ORAL | 0 refills | Status: DC
Start: 2017-07-18 — End: 2017-10-20

## 2017-07-18 NOTE — Telephone Encounter (Signed)
Returned call and pt states she has another uti, complains of frequent/painful urination and back pain. Pt is requesting refills on rx for uti and tramadol. Advised would route to provider for review.

## 2017-07-18 NOTE — Telephone Encounter (Signed)
Returned call and advised pt that provider sent rx for uti and refill for tramadol is in the office ready for pick up.

## 2017-08-21 ENCOUNTER — Other Ambulatory Visit: Payer: Self-pay | Admitting: Obstetrics

## 2017-08-21 DIAGNOSIS — N898 Other specified noninflammatory disorders of vagina: Secondary | ICD-10-CM

## 2017-10-18 ENCOUNTER — Encounter: Payer: Self-pay | Admitting: Obstetrics

## 2017-10-20 ENCOUNTER — Other Ambulatory Visit: Payer: Self-pay | Admitting: Obstetrics

## 2017-10-20 DIAGNOSIS — N3001 Acute cystitis with hematuria: Secondary | ICD-10-CM

## 2017-10-20 DIAGNOSIS — N898 Other specified noninflammatory disorders of vagina: Secondary | ICD-10-CM

## 2017-10-20 MED ORDER — CEPHALEXIN 500 MG PO CAPS: 500.0000 mg | ORAL_CAPSULE | Freq: Four times a day (QID) | ORAL | 0 refills | Status: DC

## 2017-10-20 MED ORDER — FLUCONAZOLE 150 MG PO TABS: 150.0000 mg | ORAL_TABLET | Freq: Every day | ORAL | 0 refills | Status: AC

## 2017-10-31 ENCOUNTER — Other Ambulatory Visit: Payer: Self-pay | Admitting: Obstetrics

## 2017-10-31 DIAGNOSIS — J301 Allergic rhinitis due to pollen: Secondary | ICD-10-CM

## 2017-10-31 NOTE — Progress Notes (Signed)
Patient notified

## 2017-11-05 ENCOUNTER — Other Ambulatory Visit: Payer: Self-pay | Admitting: Obstetrics

## 2017-11-05 ENCOUNTER — Encounter: Payer: Self-pay | Admitting: Obstetrics

## 2017-11-05 DIAGNOSIS — M25561 Pain in right knee: Secondary | ICD-10-CM

## 2017-11-11 ENCOUNTER — Encounter: Payer: Self-pay | Admitting: Obstetrics

## 2017-11-12 ENCOUNTER — Telehealth: Payer: Self-pay

## 2017-11-12 NOTE — Telephone Encounter (Signed)
S/w pt and advised per provider that she would need to be seen by PCP, pt agreed.

## 2017-12-11 ENCOUNTER — Other Ambulatory Visit: Payer: Self-pay | Admitting: Obstetrics

## 2017-12-11 DIAGNOSIS — M549 Dorsalgia, unspecified: Secondary | ICD-10-CM

## 2017-12-16 ENCOUNTER — Encounter: Payer: Self-pay | Admitting: Obstetrics

## 2017-12-17 ENCOUNTER — Other Ambulatory Visit: Payer: Self-pay | Admitting: Obstetrics

## 2017-12-17 DIAGNOSIS — M549 Dorsalgia, unspecified: Secondary | ICD-10-CM

## 2017-12-17 DIAGNOSIS — B373 Candidiasis of vulva and vagina: Secondary | ICD-10-CM

## 2017-12-17 DIAGNOSIS — B3731 Acute candidiasis of vulva and vagina: Secondary | ICD-10-CM

## 2017-12-17 DIAGNOSIS — N3001 Acute cystitis with hematuria: Secondary | ICD-10-CM

## 2017-12-17 MED ORDER — TRAMADOL HCL 50 MG PO TABS
50.0000 mg | ORAL_TABLET | Freq: Four times a day (QID) | ORAL | 2 refills | Status: DC | PRN
Start: 2017-12-17 — End: 2018-11-21

## 2017-12-17 MED ORDER — CEPHALEXIN 500 MG PO CAPS: 500.0000 mg | ORAL_CAPSULE | Freq: Four times a day (QID) | ORAL | 2 refills | Status: DC

## 2017-12-17 MED ORDER — TRAMADOL HCL 50 MG PO TABS: 50.0000 mg | ORAL_TABLET | Freq: Four times a day (QID) | ORAL | 2 refills | Status: DC | PRN

## 2017-12-17 MED ORDER — FLUCONAZOLE 150 MG PO TABS: 150.0000 mg | ORAL_TABLET | Freq: Once | ORAL | 2 refills | Status: AC

## 2018-01-01 ENCOUNTER — Other Ambulatory Visit (HOSPITAL_COMMUNITY)
Admission: RE | Admit: 2018-01-01 | Discharge: 2018-01-01 | Disposition: A | Discharge status: Home / Self Care | Payer: Medicaid Other | Source: Ambulatory Visit | Attending: Certified Nurse Midwife | Admitting: Certified Nurse Midwife

## 2018-01-01 ENCOUNTER — Encounter: Payer: Self-pay | Admitting: Certified Nurse Midwife

## 2018-01-01 ENCOUNTER — Ambulatory Visit (INDEPENDENT_AMBULATORY_CARE_PROVIDER_SITE_OTHER): Payer: Medicaid Other | Admitting: Certified Nurse Midwife

## 2018-01-01 VITALS — BP 118/81 | HR 83 | Ht 59.0 in | Wt 203.0 lb

## 2018-01-01 DIAGNOSIS — Z113 Encounter for screening for infections with a predominantly sexual mode of transmission: Secondary | ICD-10-CM

## 2018-01-01 DIAGNOSIS — Z01419 Encounter for gynecological examination (general) (routine) without abnormal findings: Secondary | ICD-10-CM

## 2018-01-01 DIAGNOSIS — Z0000 Encounter for general adult medical examination without abnormal findings: Secondary | ICD-10-CM

## 2018-01-01 DIAGNOSIS — R300 Dysuria: Secondary | ICD-10-CM

## 2018-01-01 LAB — POCT URINALYSIS DIPSTICK
Bilirubin, UA: NEGATIVE
GLUCOSE UA: NEGATIVE
Nitrite, UA: NEGATIVE
Protein, UA: POSITIVE — AB
Spec Grav, UA: 1.02 (ref 1.010–1.025)
Urobilinogen, UA: 0.2 E.U./dL
pH, UA: 7.5 (ref 5.0–8.0)

## 2018-01-01 NOTE — Progress Notes (Signed)
Subjective:        Julia Fowler is a 32 y.o. female here for a routine exam.  Current complaints: UTI symptoms, is overall happy with contraception provided by Nexplanon but reports monthly heavy periods lasting 10-14 days.  Is currently sexually active.  Twins are 32 years old and her other child is 268 years old.  She is working.  Her sister had breast cancer at age 32.  Needs early screening mammogram.     Personal health questionnaire:  Is patient Ashkenazi Jewish, have a family history of breast and/or ovarian cancer: yes Is there a family history of uterine cancer diagnosed at age < 2950, gastrointestinal cancer, urinary tract cancer, family member who is a Personnel officerLynch syndrome-associated carrier: no Is the patient overweight and hypertensive, family history of diabetes, personal history of gestational diabetes, preeclampsia or PCOS: yes Is patient over 6155, have PCOS,  family history of premature CHD under age 32, diabetes, smoke, have hypertension or peripheral artery disease:  no At any time, has a partner hit, kicked or otherwise hurt or frightened you?: no Over the past 2 weeks, have you felt down, depressed or hopeless?: no Over the past 2 weeks, have you felt little interest or pleasure in doing things?:not asked   Gynecologic History Patient's last menstrual period was 12/19/2017 (exact date). Contraception: Nexplanon Last Pap: 10/24/16. Results were: normal Last mammogram: none, needs early screening mammogram d/t family history.  Obstetric History OB History  Gravida Para Term Preterm AB Living  4 3 3  0 1 4  SAB TAB Ectopic Multiple Live Births  0 1 0 1 4    # Outcome Date GA Lbr Len/2nd Weight Sex Delivery Anes PTL Lv  4A Term 11/23/15 6835w5d  6 lb 2.9 oz (2.805 kg) F CS-LTranv Spinal  LIV     Birth Comments: No gross abnormalities seen.  4B Term 11/23/15 835w5d  4 lb 11.1 oz (2.13 kg) M CS-LTranv Spinal  LIV     Birth Comments: No gross abnormalities seen.  3 Term 01/21/14  2104w0d  7 lb 8.1 oz (3.405 kg) F CS-LTranv Spinal  LIV  2 TAB 06/27/09 4630w0d     Other  DEC     Birth Comments: abortion  1 Term 10/20/00 7441w0d  7 lb 7 oz (3.374 kg) M CS-Unspec EPI N LIV     Birth Comments: Did not progress past 3 cm    Past Medical History:  Diagnosis Date  . Hypothyroidism    no medication  . Rickets   . Seasonal allergies   . Thyroid disease hypothyroid    Past Surgical History:  Procedure Laterality Date  . ARTHROSCOPY KNEE W/ DRILLING Right 2018  . CESAREAN SECTION    . CESAREAN SECTION N/A 01/21/2014   Procedure: REPEAT CESAREAN SECTION;  Surgeon: Brock Badharles A Harper, MD;  Location: [facility];  Service: Obstetrics;  Laterality: N/A;  . CESAREAN SECTION MULTI-GESTATIONAL N/A 11/23/2015   Procedure: CESAREAN SECTION MULTI-GESTATIONAL/TWINS;  Surgeon: Brock Badharles A Harper, MD;  [facility];  Service: Obstetrics;  Laterality: N/A;  . TOOTH EXTRACTION Left      Current Outpatient Medications:  .  loratadine (CLARITIN) 10 MG tablet, TAKE 1 TABLET BY MOUTH EVERY DAY, Disp: 30 tablet, Rfl: 11 .  traMADol (ULTRAM) 50 MG tablet, Take 1-2 tablets (50-100 mg total) by mouth every 6 (six) hours as needed., Disp: 30 tablet, Rfl: 2 .  cephALEXin (KEFLEX) 500 MG capsule, Take 1 capsule (500 mg total) by  mouth 4 (four) times daily. (Patient not taking: Reported on 01/01/2018), Disp: 28 capsule, Rfl: 2 .  clindamycin (CLEOCIN-T) 1 % external solution, Apply topically 2 (two) times daily. (Patient not taking: Reported on 10/24/2016), Disp: 30 mL, Rfl: 0 .  cyclobenzaprine (FLEXERIL) 10 MG tablet, Take 1 tablet (10 mg total) by mouth 3 (three) times daily as needed for muscle spasms. (Patient not taking: Reported on 01/01/2018), Disp: 30 tablet, Rfl: 0 .  fluticasone (FLONASE) 50 MCG/ACT nasal spray, Place 1 spray daily into both nostrils. (Patient not taking: Reported on 01/01/2018), Disp: 16 g, Rfl: 0 .  hydrOXYzine (ATARAX/VISTARIL) 25 MG tablet, Take 1 tablet (25 mg total) 2  (two) times daily as needed by mouth. (Patient not taking: Reported on 01/01/2018), Disp: 12 tablet, Rfl: 0 .  ibuprofen (ADVIL,MOTRIN) 800 MG tablet, Take 1 tablet (800 mg total) by mouth every 8 (eight) hours as needed. (Patient not taking: Reported on 01/01/2018), Disp: 30 tablet, Rfl: 5 .  meloxicam (MOBIC) 15 MG tablet, Take 15 mg by mouth daily., Disp: , Rfl:  .  metroNIDAZOLE (FLAGYL) 500 MG tablet, Take 1 tablet (500 mg total) by mouth 2 (two) times daily. (Patient not taking: Reported on 01/01/2018), Disp: 14 tablet, Rfl: 2 .  oxyCODONE-acetaminophen (PERCOCET) 5-325 MG tablet, Take 1 tablet by mouth every 4 (four) hours as needed for moderate pain. (Patient not taking: Reported on 01/01/2018), Disp: 15 tablet, Rfl: 0 .  Prenat-FeCbn-FeAspGl-FA-Omega (OB COMPLETE PETITE) 35-5-1-200 MG CAPS, Take 1 capsule by mouth at bedtime., Disp: , Rfl:  .  Prenatal-DSS-FeCb-FeGl-FA (CITRANATAL BLOOM) 90-1 MG TABS, Take 1 tablet by mouth daily before breakfast. (Patient not taking: Reported on 01/01/2018), Disp: 30 tablet, Rfl: 11 .  terconazole (TERAZOL 3) 0.8 % vaginal cream, Place 1 applicator vaginally at bedtime. (Patient not taking: Reported on 01/01/2018), Disp: 20 g, Rfl: 2 Allergies  Allergen Reactions  . Other Other (See Comments) and Cough    Pt states that she is allergic to cats/dogs.   Reaction:  Itchy, watery eyes     Social History   Tobacco Use  . Smoking status: Never Smoker  . Smokeless tobacco: Never Used  Substance Use Topics  . Alcohol use: No    Family History  Problem Relation Age of Onset  . Cancer Sister        breast  . Diabetes Maternal Grandmother       Review of Systems  Constitutional: negative for fatigue and weight loss Respiratory: negative for cough and wheezing Cardiovascular: negative for chest pain, fatigue and palpitations Gastrointestinal: negative for abdominal pain and change in bowel habits Musculoskeletal:negative for myalgias Neurological:  negative for gait problems and tremors Behavioral/Psych: negative for abusive relationship, depression Endocrine: negative for temperature intolerance    Genitourinary:negative for abnormal menstrual periods, genital lesions, hot flashes, sexual problems and vaginal discharge Integument/breast: negative for breast lump, breast tenderness, nipple discharge and skin lesion(s)    Objective:       BP 118/81   Pulse 83   Ht 4\' 11"  (1.499 m)   Wt 203 lb (92.1 kg)   LMP 12/19/2017 (Exact Date)   BMI 41.00 kg/m  General:   alert  Skin:   no rash or abnormalities  Lungs:   clear to auscultation bilaterally  Heart:   regular rate and rhythm, S1, S2 normal, no murmur, click, rub or gallop  Breasts:   normal without suspicious masses, skin or nipple changes or axillary nodes  Abdomen:  normal findings: no  organomegaly, soft, non-tender and no hernia  Pelvis:  External genitalia: normal general appearance Urinary system: urethral meatus normal and bladder without fullness, nontender Vaginal: normal without tenderness, induration or masses Cervix: unable to view, anterior off of right side wall Adnexa: normal bimanual exam Uterus: anteverted and non-tender, normal size   Lab Review Urine pregnancy test Labs reviewed yes Radiologic studies reviewed no  50% of 30 min visit spent on counseling and coordination of care.   Assessment & Plan    Healthy female exam.    1. Encounter for routine gynecological examination with Papanicolaou smear of cervix    - Cytology - PAP  2. Dysuria     - POCT Urinalysis Dipstick - Urine Culture  3. Screen for STD (sexually transmitted disease)    - Cervicovaginal ancillary only   Education reviewed: calcium supplements, depression evaluation, low fat, low cholesterol diet, safe sex/STD prevention, self breast exams, skin cancer screening and weight bearing exercise. Contraception: Nexplanon. Mammogram ordered. Follow up in: 1 year.    Orders  Placed This Encounter  Procedures  . Urine Culture  . POCT Urinalysis Dipstick    Possible management options include: OCPs/patch for extra hormonal support d/t bleeding with Nexplanon.  [facility] mammography screening mammogram.  Follow up as needed.

## 2018-01-02 LAB — CERVICOVAGINAL ANCILLARY ONLY
CHLAMYDIA, DNA PROBE: NEGATIVE
Neisseria Gonorrhea: NEGATIVE

## 2018-01-03 LAB — URINE CULTURE

## 2018-01-03 LAB — CYTOLOGY - PAP
Diagnosis: NEGATIVE
HPV (WINDOPATH): NOT DETECTED

## 2018-01-10 ENCOUNTER — Telehealth: Payer: Self-pay

## 2018-01-10 NOTE — Telephone Encounter (Signed)
Pt called for results. Pt informed that results were normal. Pt states that she is still having pain in her left side. She states that she completed antibiotic over a week ago for a UTI.

## 2018-01-14 ENCOUNTER — Other Ambulatory Visit: Payer: Self-pay | Admitting: Certified Nurse Midwife

## 2018-01-14 DIAGNOSIS — B373 Candidiasis of vulva and vagina: Secondary | ICD-10-CM

## 2018-01-14 DIAGNOSIS — B3731 Acute candidiasis of vulva and vagina: Secondary | ICD-10-CM

## 2018-01-14 MED ORDER — TERCONAZOLE 0.8 % VA CREA: 1.0000 | TOPICAL_CREAM | Freq: Every day | VAGINAL | 0 refills | Status: DC

## 2018-01-14 MED ORDER — FLUCONAZOLE 200 MG PO TABS: 200.0000 mg | ORAL_TABLET | Freq: Once | ORAL | 0 refills | Status: AC

## 2018-01-14 NOTE — Telephone Encounter (Signed)
If she is still having pain then she needs to see her PCP.   Thank you Boykin Reaperachelle

## 2018-03-11 ENCOUNTER — Emergency Department (HOSPITAL_COMMUNITY)
Admission: EM | Admit: 2018-03-11 | Discharge: 2018-03-11 | Disposition: A | Discharge status: Home / Self Care | Payer: Medicaid Other | Attending: Emergency Medicine | Admitting: Emergency Medicine

## 2018-03-11 ENCOUNTER — Encounter (HOSPITAL_COMMUNITY): Payer: Self-pay | Admitting: *Deleted

## 2018-03-11 DIAGNOSIS — E876 Hypokalemia: Secondary | ICD-10-CM | POA: Insufficient documentation

## 2018-03-11 DIAGNOSIS — R5383 Other fatigue: Secondary | ICD-10-CM

## 2018-03-11 DIAGNOSIS — Z79899 Other long term (current) drug therapy: Secondary | ICD-10-CM | POA: Insufficient documentation

## 2018-03-11 DIAGNOSIS — R5381 Other malaise: Secondary | ICD-10-CM | POA: Insufficient documentation

## 2018-03-11 DIAGNOSIS — J302 Other seasonal allergic rhinitis: Secondary | ICD-10-CM | POA: Insufficient documentation

## 2018-03-11 DIAGNOSIS — E039 Hypothyroidism, unspecified: Secondary | ICD-10-CM | POA: Insufficient documentation

## 2018-03-11 LAB — URINALYSIS, ROUTINE W REFLEX MICROSCOPIC
Bilirubin Urine: NEGATIVE
GLUCOSE, UA: NEGATIVE mg/dL
Ketones, ur: 80 mg/dL — AB
Leukocytes, UA: NEGATIVE
Nitrite: NEGATIVE
PROTEIN: NEGATIVE mg/dL
Specific Gravity, Urine: 1.018 (ref 1.005–1.030)
pH: 6 (ref 5.0–8.0)

## 2018-03-11 LAB — CBC
HCT: 33 % — ABNORMAL LOW (ref 36.0–46.0)
HEMOGLOBIN: 10.2 g/dL — AB (ref 12.0–15.0)
MCH: 28.6 pg (ref 26.0–34.0)
MCHC: 30.9 g/dL (ref 30.0–36.0)
MCV: 92.4 fL (ref 78.0–100.0)
Platelets: 289 10*3/uL (ref 150–400)
RBC: 3.57 MIL/uL — ABNORMAL LOW (ref 3.87–5.11)
RDW: 12.5 % (ref 11.5–15.5)
WBC: 3 10*3/uL — ABNORMAL LOW (ref 4.0–10.5)

## 2018-03-11 LAB — COMPREHENSIVE METABOLIC PANEL
ALBUMIN: 3.4 g/dL — AB (ref 3.5–5.0)
ALK PHOS: 53 U/L (ref 38–126)
ALT: 13 U/L (ref 0–44)
ANION GAP: 9 (ref 5–15)
AST: 18 U/L (ref 15–41)
BILIRUBIN TOTAL: 0.7 mg/dL (ref 0.3–1.2)
BUN: 8 mg/dL (ref 6–20)
CALCIUM: 8.6 mg/dL — AB (ref 8.9–10.3)
CO2: 22 mmol/L (ref 22–32)
Chloride: 108 mmol/L (ref 98–111)
Creatinine, Ser: 0.52 mg/dL (ref 0.44–1.00)
GFR calc Af Amer: >60 mL/min (ref >60)
GFR calc non Af Amer: >60 mL/min (ref >60)
Glucose, Bld: 80 mg/dL (ref 70–99)
Potassium: 3.1 mmol/L — ABNORMAL LOW (ref 3.5–5.1)
SODIUM: 139 mmol/L (ref 135–145)
TOTAL PROTEIN: 7.3 g/dL (ref 6.5–8.1)

## 2018-03-11 LAB — LIPASE, BLOOD: Lipase: 28 U/L (ref 11–51)

## 2018-03-11 LAB — I-STAT BETA HCG BLOOD, ED (MC, WL, AP ONLY): I-stat hCG, quantitative: <5 m[IU]/mL (ref <5)

## 2018-03-11 MED ORDER — KETOROLAC TROMETHAMINE 15 MG/ML IJ SOLN
15.0000 mg | Freq: Once | INTRAMUSCULAR | Status: AC
  Administered 2018-03-11: 15 mg via INTRAMUSCULAR
  Filled 2018-03-11: qty 1

## 2018-03-11 MED ORDER — ONDANSETRON HCL 4 MG PO TABS
4.0000 mg | ORAL_TABLET | Freq: Once | ORAL | Status: AC
  Administered 2018-03-11: 4 mg via ORAL
  Filled 2018-03-11: qty 1

## 2018-03-11 MED ORDER — POTASSIUM CHLORIDE CRYS ER 20 MEQ PO TBCR
40.0000 meq | EXTENDED_RELEASE_TABLET | Freq: Once | ORAL | Status: AC
  Administered 2018-03-11: 40 meq via ORAL
  Filled 2018-03-11: qty 2

## 2018-03-11 NOTE — ED Provider Notes (Signed)
[facility] EMERGENCY DEPARTMENT Provider Note   CSN: [id number] Arrival date & time: 03/11/18  0850     History   Chief Complaint Chief Complaint  Patient presents with  . Headache  . Emesis    HPI Julia Fowler is a 32 y.o. female.  32 year old female with prior medical history as documented below presents with complaint of headache, right ear congestion, malaise, fatigue, nausea, diarrhea, and "just not feeling good."  Symptoms all started yesterday.  She denies associated fever or chest pain.  She denies shortness of breath.  She took some ibuprofen at home with minimal improvement in her symptoms.  Note, she request a work note for today.  The history is provided by the patient and medical records.  Illness  This is a new problem. The current episode started yesterday. The problem occurs rarely. The problem has not changed since onset.Associated symptoms include headaches. Pertinent negatives include no chest pain, no abdominal pain and no shortness of breath. Nothing aggravates the symptoms. Nothing relieves the symptoms. The treatment provided no relief.    Past Medical History:  Diagnosis Date  . Hypothyroidism    no medication  . Rickets   . Seasonal allergies   . Thyroid disease hypothyroid    Patient Active Problem List   Diagnosis Date Noted  . Preeclampsia in postpartum period 12/09/2015  . S/P cesarean section 11/23/2015  . Twins 11/03/2015  . [redacted] weeks gestation of pregnancy   . Hereditary disease in family possibly affecting fetus, affecting management of mother, antepartum condition or complication   . Cesarean delivery, without mention of indication, delivered, with or without mention of antepartum condition 01/21/2014  . Excessive fetal growth 12/28/2013  . History of C-section 07/14/2013  . Hypothyroid 04/10/2013  . BMI 37.0-37.9, adult 04/10/2013  . Rickets 04/10/2013  . Family history of breast cancer in first degree relative  04/10/2013    Past Surgical History:  Procedure Laterality Date  . ARTHROSCOPY KNEE W/ DRILLING Right 2018  . CESAREAN SECTION    . CESAREAN SECTION N/A 01/21/2014   Procedure: REPEAT CESAREAN SECTION;  Surgeon: Brock Bad, MD;  Location: [facility];  Service: Obstetrics;  Laterality: N/A;  . CESAREAN SECTION MULTI-GESTATIONAL N/A 11/23/2015   Procedure: CESAREAN SECTION MULTI-GESTATIONAL/TWINS;  Surgeon: Brock Bad, MD;  Location: [facility] BIRTHING SUITES;  Service: Obstetrics;  Laterality: N/A;  . TOOTH EXTRACTION Left      OB History    Gravida  4   Para  3   Term  3   Preterm  0   AB  1   Living  4     SAB  0   TAB  1   Ectopic  0   Multiple  1   Live Births  4            Home Medications    Prior to Admission medications   Medication Sig Start Date End Date Taking? Authorizing Provider  cephALEXin (KEFLEX) 500 MG capsule Take 1 capsule (500 mg total) by mouth 4 (four) times daily. Patient not taking: Reported on 01/01/2018 12/17/17   Brock Bad, MD  clindamycin (CLEOCIN-T) 1 % external solution Apply topically 2 (two) times daily. Patient not taking: Reported on 10/24/2016 07/12/16   Brock Bad, MD  cyclobenzaprine (FLEXERIL) 10 MG tablet Take 1 tablet (10 mg total) by mouth 3 (three) times daily as needed for muscle spasms. Patient not taking: Reported on 01/01/2018 06/28/17  Dione Booze, MD  fluticasone [facility]) 50 MCG/ACT nasal spray Place 1 spray daily into both nostrils. Patient not taking: Reported on 01/01/2018 05/23/17   Caccavale, Sophia, PA-C  hydrOXYzine (ATARAX/VISTARIL) 25 MG tablet Take 1 tablet (25 mg total) 2 (two) times daily as needed by mouth. Patient not taking: Reported on 01/01/2018 05/23/17   Caccavale, Sophia, PA-C  ibuprofen (ADVIL,MOTRIN) 800 MG tablet Take 1 tablet (800 mg total) by mouth every 8 (eight) hours as needed. Patient not taking: Reported on 01/01/2018 04/18/17   Brock Bad, MD  loratadine (CLARITIN)  10 MG tablet TAKE 1 TABLET BY MOUTH EVERY DAY 11/01/17   Brock Bad, MD  meloxicam (MOBIC) 15 MG tablet Take 15 mg by mouth daily.    [provider]  metroNIDAZOLE (FLAGYL) 500 MG tablet Take 1 tablet (500 mg total) by mouth 2 (two) times daily. Patient not taking: Reported on 01/01/2018 04/18/17   Brock Bad, MD  oxyCODONE-acetaminophen (PERCOCET) 5-325 MG tablet Take 1 tablet by mouth every 4 (four) hours as needed for moderate pain. Patient not taking: Reported on 01/01/2018 06/28/17   Dione Booze, MD  Prenat-FeCbn-FeAspGl-FA-Omega (OB COMPLETE PETITE) 35-5-1-200 MG CAPS Take 1 capsule by mouth at bedtime.    [provider]  Prenatal-DSS-FeCb-FeGl-FA Cherlyn Labella BLOOM) 90-1 MG TABS Take 1 tablet by mouth daily before breakfast. Patient not taking: Reported on 01/01/2018 10/24/16   Brock Bad, MD  terconazole (TERAZOL 3) 0.8 % vaginal cream Place 1 applicator vaginally at bedtime. 01/14/18   Orvilla Cornwall A, CNM  traMADol (ULTRAM) 50 MG tablet Take 1-2 tablets (50-100 mg total) by mouth every 6 (six) hours as needed. 12/17/17   Brock Bad, MD    Family History Family History  Problem Relation Age of Onset  . Cancer Sister        breast  . Diabetes Maternal Grandmother     Social History Social History   Tobacco Use  . Smoking status: Never Smoker  . Smokeless tobacco: Never Used  Substance Use Topics  . Alcohol use: No  . Drug use: No     Allergies   Other   Review of Systems Review of Systems  Respiratory: Negative for shortness of breath.   Cardiovascular: Negative for chest pain.  Gastrointestinal: Negative for abdominal pain.  Neurological: Positive for headaches.  All other systems reviewed and are negative.    Physical Exam Updated Vital Signs BP 140/81 (BP Location: Right Arm)   Pulse 78   Temp 98.6 F (37 C) (Oral)   Resp 20   SpO2 100%   Physical Exam  Constitutional: She is oriented to person, place, and  time. She appears well-developed and well-nourished. No distress.  HENT:  Head: Normocephalic and atraumatic.  Mouth/Throat: Oropharynx is clear and moist.  Mild effusion behind right TM - no erythema, intact light reflexes   Eyes: Pupils are equal, round, and reactive to light. Conjunctivae and EOM are normal.  Neck: Normal range of motion. Neck supple.  Cardiovascular: Normal rate, regular rhythm and normal heart sounds.  Pulmonary/Chest: Effort normal and breath sounds normal. No respiratory distress.  Abdominal: Soft. She exhibits no distension. There is no tenderness.  Musculoskeletal: Normal range of motion. She exhibits no edema or deformity.  Neurological: She is alert and oriented to person, place, and time. She has normal strength.  AOX4  Normal Speech No facial droop Normal gait   Skin: Skin is warm and dry.  Psychiatric: She has a normal  mood and affect.  Nursing note and vitals reviewed.    ED Treatments / Results  Labs (all labs ordered are listed, but only abnormal results are displayed) Labs Reviewed  COMPREHENSIVE METABOLIC PANEL - Abnormal; Notable for the following components:      Result Value   Potassium 3.1 (*)    Calcium 8.6 (*)    Albumin 3.4 (*)    All other components within normal limits  CBC - Abnormal; Notable for the following components:   WBC 3.0 (*)    RBC 3.57 (*)    Hemoglobin 10.2 (*)    HCT 33.0 (*)    All other components within normal limits  URINALYSIS, ROUTINE W REFLEX MICROSCOPIC - Abnormal; Notable for the following components:   Hgb urine dipstick LARGE (*)    Ketones, ur 80 (*)    Bacteria, UA RARE (*)    All other components within normal limits  LIPASE, BLOOD  I-STAT BETA HCG BLOOD, ED ([facility], [facility], [facility] ONLY)    EKG None  Radiology No results found.  Procedures Procedures (including critical care time)  Medications Ordered in ED Medications  ketorolac (TORADOL) 15 MG/ML injection 15 mg (has no administration in time  range)  ondansetron (ZOFRAN) tablet 4 mg (has no administration in time range)     Initial Impression / Assessment and Plan / ED Course  I have reviewed the triage vital signs and the nursing notes.  Pertinent labs & imaging results that were available during my care of the patient were reviewed by me and considered in my medical decision making (see chart for details).     MDM  Screen complete   Patient is presenting for evaluation of reported malaise and fatigue.  Symptoms are most likely secondary to a viral process.  Patient does not appear to be in significant distress.  Screening labs are without significant abnormality other than mildly decreased potassium.  Following her ED work-up and treatment she feels improved.  Strict return precautions are given and understood.  Importance of close follow-up was stressed.   Final Clinical Impressions(s) / ED Diagnoses   Final diagnoses:  Malaise and fatigue  Hypokalemia    ED Discharge Orders    None       Wynetta Fines, MD 03/11/18 1116

## 2018-03-11 NOTE — ED Triage Notes (Signed)
Pt in c/o headache, ear congestion, n/v/d for the last few days, also body aches, no distress noted

## 2018-03-11 NOTE — Discharge Instructions (Signed)
Please return for any problem.  Follow-up with your regular doctor as instructed. °

## 2018-11-21 ENCOUNTER — Encounter (HOSPITAL_COMMUNITY): Payer: Self-pay

## 2018-11-21 ENCOUNTER — Ambulatory Visit (INDEPENDENT_AMBULATORY_CARE_PROVIDER_SITE_OTHER): Payer: Self-pay

## 2018-11-21 ENCOUNTER — Other Ambulatory Visit: Payer: Self-pay

## 2018-11-21 ENCOUNTER — Ambulatory Visit (HOSPITAL_COMMUNITY)
Admission: EM | Admit: 2018-11-21 | Discharge: 2018-11-21 | Disposition: A | Discharge status: Home / Self Care | Payer: Medicaid Other | Attending: Family Medicine | Admitting: Family Medicine

## 2018-11-21 DIAGNOSIS — M25561 Pain in right knee: Secondary | ICD-10-CM

## 2018-11-21 MED ORDER — TRAMADOL HCL 50 MG PO TABS: 50.0000 mg | ORAL_TABLET | Freq: Four times a day (QID) | ORAL | 0 refills | Status: DC | PRN

## 2018-11-21 MED ORDER — MELOXICAM 7.5 MG PO TABS: 7.5000 mg | ORAL_TABLET | Freq: Every day | ORAL | 0 refills | Status: DC

## 2018-11-21 NOTE — ED Provider Notes (Signed)
[facility]    CSN: [id number] Arrival date & time: 11/21/18  1209     History   Chief Complaint Chief Complaint  Patient presents with   Knee Pain    HPI Julia Fowler is a 33 y.o. female.   Patient is a 32 year old female with past medical history of rickets who presents today with right knee pain.  This is been present and worsening over the last 2 weeks.  She does have history of meniscal repair to that knee.  This was approximately 2 years ago.  She denies any specific injuries.  Denies any specific situation where she twisted the knee or fell on the knee.  The pain is worse medial to the patella.  Going up stairs and bending and stooping makes the pain worse.  She is having some throbbing at night with laying in bed.  She does admit to history of arthritis in that knee.  He has been taking Tylenol and ibuprofen without much relief.  Denies any fever.  Denies any numbness, tingling or posterior knee pain.  Denies any calf pain or swelling. ROS per HPI      Past Medical History:  Diagnosis Date   Hypothyroidism    no medication   Rickets    Seasonal allergies    Thyroid disease hypothyroid    Patient Active Problem List   Diagnosis Date Noted   Preeclampsia in postpartum period 12/09/2015   S/P cesarean section 11/23/2015   Twins 11/03/2015   [redacted] weeks gestation of pregnancy    Hereditary disease in family possibly affecting fetus, affecting management of mother, antepartum condition or complication    Cesarean delivery, without mention of indication, delivered, with or without mention of antepartum condition 01/21/2014   Excessive fetal growth 12/28/2013   History of C-section 07/14/2013   Hypothyroid 04/10/2013   BMI 37.0-37.9, adult 04/10/2013   Rickets 04/10/2013   Family history of breast cancer in first degree relative 04/10/2013    Past Surgical History:  Procedure Laterality Date   ARTHROSCOPY KNEE W/ DRILLING Right 2018    CESAREAN SECTION     CESAREAN SECTION N/A 01/21/2014   Procedure: REPEAT CESAREAN SECTION;  Surgeon: Brock Bad, MD;  Location: [facility];  Service: Obstetrics;  Laterality: N/A;   CESAREAN SECTION MULTI-GESTATIONAL N/A 11/23/2015   Procedure: CESAREAN SECTION MULTI-GESTATIONAL/TWINS;  Surgeon: Brock Bad, MD;  Location: [facility] BIRTHING SUITES;  Service: Obstetrics;  Laterality: N/A;   TOOTH EXTRACTION Left     OB History    Gravida  4   Para  3   Term  3   Preterm  0   AB  1   Living  4     SAB  0   TAB  1   Ectopic  0   Multiple  1   Live Births  4            Home Medications    Prior to Admission medications   Medication Sig Start Date End Date Taking? Authorizing Provider  etonogestrel (NEXPLANON) 68 MG IMPL implant 1 each by Subdermal route once.    [provider]  loratadine (CLARITIN) 10 MG tablet TAKE 1 TABLET BY MOUTH EVERY DAY Patient taking differently: Take 10 mg by mouth daily as needed for allergies.  11/01/17   Brock Bad, MD  meloxicam (MOBIC) 7.5 MG tablet Take 1 tablet (7.5 mg total) by mouth daily. 11/21/18   Janace Aris, NP  traMADol Janean Sark)  50 MG tablet Take 1 tablet (50 mg total) by mouth every 6 (six) hours as needed. 11/21/18   Janace Aris, NP    Family History Family History  Problem Relation Age of Onset   Cancer Sister        breast   Diabetes Maternal Grandmother     Social History Social History   Tobacco Use   Smoking status: Never Smoker   Smokeless tobacco: Never Used  Substance Use Topics   Alcohol use: No   Drug use: No     Allergies   Other   Review of Systems Review of Systems   Physical Exam Triage Vital Signs ED Triage Vitals  Enc Vitals Group     BP 11/21/18 1314 114/83     Pulse Rate 11/21/18 1314 80     Resp 11/21/18 1314 18     Temp 11/21/18 1314 97.9 F (36.6 C)     Temp Source 11/21/18 1314 Oral     SpO2 11/21/18 1314 100 %     Weight --      Height --        Head Circumference --      Peak Flow --      Pain Score 11/21/18 1312 9     Pain Loc --      Pain Edu? --      Excl. in GC? --    No data found.  Updated Vital Signs BP 114/83 (BP Location: Left Arm)    Pulse 80    Temp 97.9 F (36.6 C) (Oral)    Resp 18    LMP 11/21/2018 Comment: nexplanon placed 2017   SpO2 100%   Visual Acuity Right Eye Distance:   Left Eye Distance:   Bilateral Distance:    Right Eye Near:   Left Eye Near:    Bilateral Near:     Physical Exam Vitals signs and nursing note reviewed.  Constitutional:      General: She is not in acute distress.    Appearance: Normal appearance. She is not ill-appearing, toxic-appearing or diaphoretic.  HENT:     Head: Normocephalic and atraumatic.  Eyes:     Conjunctiva/sclera: Conjunctivae normal.  Pulmonary:     Effort: Pulmonary effort is normal.  Musculoskeletal:        General: Tenderness present.     Comments: TPP of the right knee medical to the patella. No obvious swelling, bruising or deformities. Able to flex and extend the knee.  No laxity noted.  Skin:    General: Skin is warm and dry.  Neurological:     Mental Status: She is alert.  Psychiatric:        Mood and Affect: Mood normal.      UC Treatments / Results  Labs (all labs ordered are listed, but only abnormal results are displayed) Labs Reviewed - No data to display  EKG None  Radiology Dg Knee Complete 4 Views Right  Result Date: 11/21/2018 CLINICAL DATA:  Knee pain EXAM: RIGHT KNEE - COMPLETE 4+ VIEW COMPARISON:  None. FINDINGS: There is no acute fracture or dislocation of the right knee. There is moderate tricompartmental arthrosis and irregularity of the articular surfaces, likely post traumatic and possibly with chronic fracture deformities of the proximal right tibia and fibula. No knee joint effusion. IMPRESSION: There is no acute fracture or dislocation of the right knee. There is moderate tricompartmental arthrosis and  irregularity of the articular surfaces, likely post traumatic and possibly  with chronic fracture deformities of the proximal right tibia and fibula. No knee joint effusion. Electronically Signed   By: Lauralyn PrimesAlex  Bibbey M.D.   On: 11/21/2018 14:20    Procedures Procedures (including critical care time)  Medications Ordered in UC Medications - No data to display  Initial Impression / Assessment and Plan / UC Course  I have reviewed the triage vital signs and the nursing notes.  Pertinent labs & imaging results that were available during my care of the patient were reviewed by me and considered in my medical decision making (see chart for details).    Knee pain  No acute abnormalities on x-ray. Most likely pain from  tricompartmental arthrosis She has history of same. Prescribed meloxicam for pain inflammation.  Tramadol for more severe pain. Recommended that if her symptoms continue or worsen she will need to follow-up with her orthopedic  Final Clinical Impressions(s) / UC Diagnoses   Final diagnoses:  Acute pain of right knee     Discharge Instructions     X ray consistent with arthritis We will treat this with meloxicam Rest, ice, elevate Follow up with ortho     ED Prescriptions    Medication Sig Dispense Auth. Provider   meloxicam (MOBIC) 7.5 MG tablet Take 1 tablet (7.5 mg total) by mouth daily. 30 tablet Abagael Kramm A, NP   traMADol (ULTRAM) 50 MG tablet Take 1 tablet (50 mg total) by mouth every 6 (six) hours as needed. 15 tablet Dahlia ByesBast, Vena Bassinger A, NP     Controlled Substance Prescriptions [place] Controlled Substance Registry consulted? Not Applicable   Janace ArisBast, Cyerra Yim A, NP 11/21/18 1549

## 2018-11-21 NOTE — ED Triage Notes (Signed)
Patient presents to Urgent Care with complaints of recurrent right knee pain since 2 weeks ago.

## 2018-11-21 NOTE — Discharge Instructions (Addendum)
X ray consistent with arthritis We will treat this with meloxicam Rest, ice, elevate Follow up with ortho

## 2018-12-31 ENCOUNTER — Ambulatory Visit (INDEPENDENT_AMBULATORY_CARE_PROVIDER_SITE_OTHER): Payer: Self-pay

## 2018-12-31 ENCOUNTER — Other Ambulatory Visit: Payer: Self-pay

## 2018-12-31 VITALS — BP 113/74 | HR 75 | Ht 59.0 in | Wt 216.0 lb

## 2018-12-31 DIAGNOSIS — N3001 Acute cystitis with hematuria: Secondary | ICD-10-CM

## 2018-12-31 DIAGNOSIS — B3731 Acute candidiasis of vulva and vagina: Secondary | ICD-10-CM

## 2018-12-31 DIAGNOSIS — B373 Candidiasis of vulva and vagina: Secondary | ICD-10-CM

## 2018-12-31 LAB — POCT URINALYSIS DIPSTICK
Bilirubin, UA: NEGATIVE
Glucose, UA: NEGATIVE
Leukocytes, UA: NEGATIVE
Nitrite, UA: NEGATIVE
Protein, UA: POSITIVE — AB
Spec Grav, UA: 1.025 (ref 1.010–1.025)
Urobilinogen, UA: 0.2 E.U./dL
pH, UA: 6 (ref 5.0–8.0)

## 2018-12-31 MED ORDER — NITROFURANTOIN MONOHYD MACRO 100 MG PO CAPS: 100.0000 mg | ORAL_CAPSULE | Freq: Two times a day (BID) | ORAL | 2 refills | Status: DC

## 2018-12-31 MED ORDER — TRAMADOL HCL 50 MG PO TABS: 50.0000 mg | ORAL_TABLET | Freq: Four times a day (QID) | ORAL | 1 refills | Status: DC | PRN

## 2018-12-31 MED ORDER — PHENAZOPYRIDINE HCL 200 MG PO TABS: 200.0000 mg | ORAL_TABLET | Freq: Three times a day (TID) | ORAL | 0 refills | Status: DC | PRN

## 2018-12-31 MED ORDER — FLUCONAZOLE 150 MG PO TABS: 150.0000 mg | ORAL_TABLET | Freq: Once | ORAL | 2 refills | Status: AC

## 2018-12-31 NOTE — Progress Notes (Signed)
SUBJECTIVE: Julia Fowler is a 33 y.o. female who complains of urinary frequency, urgency and dysuria x 7 days, with flank pain radiating to her back 10/10, nausea and fatigue. Denies fever, chills, or abnormal vaginal discharge or Abnormal bleeding.   OBJECTIVE: Appears well, in no apparent distress.  Vital signs are normal. Urine dipstick shows positive for Ketones, ++Protein.    ASSESSMENT: Dysuria/ UTI  PLAN: Treatment per orders.  Call or return to clinic prn if these symptoms worsen or fail to improve as anticipated.

## 2019-01-02 LAB — URINE CULTURE

## 2019-01-13 ENCOUNTER — Ambulatory Visit: Payer: Medicaid Other | Admitting: Obstetrics & Gynecology

## 2019-04-03 ENCOUNTER — Telehealth: Payer: Self-pay

## 2019-04-03 ENCOUNTER — Other Ambulatory Visit: Payer: Self-pay | Admitting: Obstetrics

## 2019-04-03 DIAGNOSIS — N3001 Acute cystitis with hematuria: Secondary | ICD-10-CM

## 2019-04-03 MED ORDER — TRAMADOL HCL 50 MG PO TABS: 50.0000 mg | ORAL_TABLET | Freq: Four times a day (QID) | ORAL | 1 refills | Status: DC | PRN

## 2019-04-03 NOTE — Telephone Encounter (Signed)
Pt called and would like to know if she can have a refill on her tramadol. I advised her that I would send her request to the provider. Pt verbalizes understanding.

## 2019-04-03 NOTE — Telephone Encounter (Signed)
Tramadol Rx 

## 2019-05-18 ENCOUNTER — Other Ambulatory Visit: Payer: Self-pay | Admitting: Obstetrics

## 2019-05-18 DIAGNOSIS — N3001 Acute cystitis with hematuria: Secondary | ICD-10-CM

## 2019-05-26 ENCOUNTER — Other Ambulatory Visit: Payer: Self-pay

## 2019-05-26 DIAGNOSIS — Z20822 Contact with and (suspected) exposure to covid-19: Secondary | ICD-10-CM

## 2019-05-28 LAB — NOVEL CORONAVIRUS, NAA: SARS-CoV-2, NAA: NOT DETECTED

## 2019-07-15 ENCOUNTER — Ambulatory Visit: Payer: Medicaid Other | Attending: Internal Medicine

## 2019-07-15 DIAGNOSIS — Z20822 Contact with and (suspected) exposure to covid-19: Secondary | ICD-10-CM

## 2019-07-17 LAB — NOVEL CORONAVIRUS, NAA: SARS-CoV-2, NAA: NOT DETECTED

## 2019-07-28 ENCOUNTER — Ambulatory Visit (INDEPENDENT_AMBULATORY_CARE_PROVIDER_SITE_OTHER): Payer: Medicaid Other | Admitting: Obstetrics and Gynecology

## 2019-07-28 ENCOUNTER — Other Ambulatory Visit (HOSPITAL_COMMUNITY)
Admission: RE | Admit: 2019-07-28 | Payer: Medicaid Other | Source: Ambulatory Visit | Admitting: Obstetrics and Gynecology

## 2019-07-28 ENCOUNTER — Other Ambulatory Visit: Payer: Self-pay

## 2019-07-28 ENCOUNTER — Encounter: Payer: Self-pay | Admitting: Obstetrics and Gynecology

## 2019-07-28 ENCOUNTER — Ambulatory Visit: Payer: Medicaid Other | Attending: Internal Medicine

## 2019-07-28 ENCOUNTER — Other Ambulatory Visit (HOSPITAL_COMMUNITY)
Admission: RE | Admit: 2019-07-28 | Discharge: 2019-07-28 | Disposition: A | Discharge status: Home / Self Care | Payer: Medicaid Other | Source: Ambulatory Visit | Attending: Obstetrics and Gynecology | Admitting: Obstetrics and Gynecology

## 2019-07-28 VITALS — BP 122/72 | HR 87 | Wt 211.0 lb

## 2019-07-28 DIAGNOSIS — Z01419 Encounter for gynecological examination (general) (routine) without abnormal findings: Secondary | ICD-10-CM

## 2019-07-28 DIAGNOSIS — Z0000 Encounter for general adult medical examination without abnormal findings: Secondary | ICD-10-CM

## 2019-07-28 DIAGNOSIS — Z3200 Encounter for pregnancy test, result unknown: Secondary | ICD-10-CM | POA: Diagnosis not present

## 2019-07-28 DIAGNOSIS — Z20822 Contact with and (suspected) exposure to covid-19: Secondary | ICD-10-CM | POA: Insufficient documentation

## 2019-07-28 LAB — POCT URINE PREGNANCY: Preg Test, Ur: NEGATIVE

## 2019-07-28 LAB — POCT URINALYSIS DIPSTICK
Bilirubin, UA: NEGATIVE
Blood, UA: POSITIVE
Glucose, UA: NEGATIVE
Ketones, UA: NEGATIVE
Leukocytes, UA: NEGATIVE
Nitrite, UA: NEGATIVE
Protein, UA: NEGATIVE
Spec Grav, UA: 1.02 (ref 1.010–1.025)
Urobilinogen, UA: 0.2 E.U./dL
pH, UA: 6 (ref 5.0–8.0)

## 2019-07-28 NOTE — Progress Notes (Signed)
Pt presents for Annual Exam  Last pap: 12/2017 per pt  WNL   CC: abdominal pain and back pain possible UTI.

## 2019-07-28 NOTE — Progress Notes (Signed)
Subjective:     Julia Fowler is a 34 y.o. female P4 with BMI 42 who is here for a comprehensive physical exam. The patient reports a 2 week history of bilateral flank pain, dysuria and some frequency. Patient is sexually active using Nexplanon for contraception. Nexplanon is due for removal in July and patient plans to have another inserted  Past Medical History:  Diagnosis Date  . Hypothyroidism    no medication  . Rickets   . Seasonal allergies   . Thyroid disease hypothyroid   Past Surgical History:  Procedure Laterality Date  . ARTHROSCOPY KNEE W/ DRILLING Right 2018  . CESAREAN SECTION    . CESAREAN SECTION N/A 01/21/2014   Procedure: REPEAT CESAREAN SECTION;  Surgeon: Brock Bad, MD;  Location: [facility];  Service: Obstetrics;  Laterality: N/A;  . CESAREAN SECTION MULTI-GESTATIONAL N/A 11/23/2015   Procedure: CESAREAN SECTION MULTI-GESTATIONAL/TWINS;  Surgeon: Brock Bad, MD;  Location: [facility] BIRTHING SUITES;  Service: Obstetrics;  Laterality: N/A;  . TOOTH EXTRACTION Left    Family History  Problem Relation Age of Onset  . Cancer Sister        breast  . Diabetes Maternal Grandmother     Social History   Socioeconomic History  . Marital status: Single    Spouse name: Not on file  . Number of children: Not on file  . Years of education: Not on file  . Highest education level: Not on file  Occupational History  . Not on file  Tobacco Use  . Smoking status: Never Smoker  . Smokeless tobacco: Never Used  Substance and Sexual Activity  . Alcohol use: No  . Drug use: No  . Sexual activity: Yes    Partners: Male    Birth control/protection: Inserts    Comment: Nexplanon (2017)  Other Topics Concern  . Not on file  Social History Narrative  . Not on file   Social Determinants of Health   Financial Resource Strain:   . Difficulty of Paying Living Expenses: Not on file  Food Insecurity:   . Worried About Programme researcher, broadcasting/film/video in the Last Year: Not on file  .  Ran Out of Food in the Last Year: Not on file  Transportation Needs:   . Lack of Transportation (Medical): Not on file  . Lack of Transportation (Non-Medical): Not on file  Physical Activity:   . Days of Exercise per Week: Not on file  . Minutes of Exercise per Session: Not on file  Stress:   . Feeling of Stress : Not on file  Social Connections:   . Frequency of Communication with Friends and Family: Not on file  . Frequency of Social Gatherings with Friends and Family: Not on file  . Attends Religious Services: Not on file  . Active Member of Clubs or Organizations: Not on file  . Attends Banker Meetings: Not on file  . Marital Status: Not on file  Intimate Partner Violence:   . Fear of Current or Ex-Partner: Not on file  . Emotionally Abused: Not on file  . Physically Abused: Not on file  . Sexually Abused: Not on file   Health Maintenance  Topic Date Due  . INFLUENZA VACCINE  02/07/2019  . PAP SMEAR-Modifier  01/01/2021  . TETANUS/TDAP  01/23/2024  . HIV Screening  Completed       Review of Systems Pertinent items noted in HPI and remainder of comprehensive ROS otherwise negative.   Objective:  Blood pressure 122/72, pulse 87, weight 211 lb (95.7 kg).     GENERAL: Well-developed, well-nourished female in no acute distress.  HEENT: Normocephalic, atraumatic. Sclerae anicteric.  NECK: Supple. Normal thyroid.  LUNGS: Clear to auscultation bilaterally.  HEART: Regular rate and rhythm. BREASTS: Symmetric in size. No palpable masses or lymphadenopathy, skin changes, or nipple drainage. ABDOMEN: Soft, nontender, nondistended. No organomegaly. PELVIC: Normal external female genitalia. Vagina is pink and rugated.  Normal discharge. Normal appearing cervix. Uterus is normal in size. No adnexal mass or tenderness. EXTREMITIES: No cyanosis, clubbing, or edema, 2+ distal pulses.    Assessment:    Healthy female exam.      Plan:    pap smear collected STI  testing per patient request Urine culture to assess flank pain Patient will be contacted with any abnormal results See After Visit Summary for Counseling Recommendations

## 2019-07-29 LAB — CERVICOVAGINAL ANCILLARY ONLY
Chlamydia: NEGATIVE
Comment: NEGATIVE
Comment: NEGATIVE
Comment: NORMAL
Neisseria Gonorrhea: NEGATIVE
Trichomonas: NEGATIVE

## 2019-07-29 LAB — RPR: RPR Ser Ql: NONREACTIVE

## 2019-07-29 LAB — HEPATITIS C ANTIBODY: Hep C Virus Ab: <0.1 s/co ratio (ref 0.0–0.9)

## 2019-07-29 LAB — HEPATITIS B SURFACE ANTIGEN: Hepatitis B Surface Ag: NEGATIVE

## 2019-07-29 LAB — HIV ANTIBODY (ROUTINE TESTING W REFLEX): HIV Screen 4th Generation wRfx: NONREACTIVE

## 2019-07-29 LAB — NOVEL CORONAVIRUS, NAA: SARS-CoV-2, NAA: NOT DETECTED

## 2019-07-30 ENCOUNTER — Telehealth: Payer: Self-pay | Admitting: General Practice

## 2019-07-30 LAB — URINE CULTURE

## 2019-07-30 NOTE — Telephone Encounter (Signed)
Negative COVID results given. Patient results "NOT Detected." Caller expressed understanding. ° °

## 2019-08-03 LAB — CYTOLOGY - PAP
Comment: NEGATIVE
Diagnosis: NEGATIVE
High risk HPV: NEGATIVE

## 2019-08-04 ENCOUNTER — Other Ambulatory Visit: Payer: Self-pay | Admitting: Obstetrics and Gynecology

## 2019-08-04 ENCOUNTER — Other Ambulatory Visit: Payer: Medicaid Other

## 2019-08-04 DIAGNOSIS — R102 Pelvic and perineal pain: Secondary | ICD-10-CM

## 2019-08-05 LAB — SPECIMEN STATUS REPORT

## 2019-08-10 ENCOUNTER — Other Ambulatory Visit: Payer: Self-pay

## 2019-08-10 ENCOUNTER — Emergency Department (HOSPITAL_COMMUNITY)
Admission: EM | Admit: 2019-08-10 | Discharge: 2019-08-10 | Disposition: A | Discharge status: Home / Self Care | Payer: Medicaid Other | Attending: Emergency Medicine | Admitting: Emergency Medicine

## 2019-08-10 DIAGNOSIS — R110 Nausea: Secondary | ICD-10-CM | POA: Insufficient documentation

## 2019-08-10 DIAGNOSIS — R519 Headache, unspecified: Secondary | ICD-10-CM

## 2019-08-10 DIAGNOSIS — G4700 Insomnia, unspecified: Secondary | ICD-10-CM | POA: Insufficient documentation

## 2019-08-10 DIAGNOSIS — E039 Hypothyroidism, unspecified: Secondary | ICD-10-CM

## 2019-08-10 LAB — URINALYSIS, ROUTINE W REFLEX MICROSCOPIC
Bilirubin Urine: NEGATIVE
Glucose, UA: NEGATIVE mg/dL
Ketones, ur: NEGATIVE mg/dL
Leukocytes,Ua: NEGATIVE
Nitrite: NEGATIVE
Protein, ur: 30 mg/dL — AB
Specific Gravity, Urine: 1.028 (ref 1.005–1.030)
pH: 5 (ref 5.0–8.0)

## 2019-08-10 LAB — COMPREHENSIVE METABOLIC PANEL
ALT: 12 U/L (ref 0–44)
AST: 19 U/L (ref 15–41)
Albumin: 3.5 g/dL (ref 3.5–5.0)
Alkaline Phosphatase: 62 U/L (ref 38–126)
Anion gap: 9 (ref 5–15)
BUN: 15 mg/dL (ref 6–20)
CO2: 22 mmol/L (ref 22–32)
Calcium: 8.7 mg/dL — ABNORMAL LOW (ref 8.9–10.3)
Chloride: 108 mmol/L (ref 98–111)
Creatinine, Ser: 0.58 mg/dL (ref 0.44–1.00)
GFR calc Af Amer: >60 mL/min (ref >60)
GFR calc non Af Amer: >60 mL/min (ref >60)
Glucose, Bld: 96 mg/dL (ref 70–99)
Potassium: 3.6 mmol/L (ref 3.5–5.1)
Sodium: 139 mmol/L (ref 135–145)
Total Bilirubin: 0.3 mg/dL (ref 0.3–1.2)
Total Protein: 7.4 g/dL (ref 6.5–8.1)

## 2019-08-10 LAB — CBC WITH DIFFERENTIAL/PLATELET
Abs Immature Granulocytes: 0 10*3/uL (ref 0.00–0.07)
Basophils Absolute: 0 10*3/uL (ref 0.0–0.1)
Basophils Relative: 1 %
Eosinophils Absolute: 0.1 10*3/uL (ref 0.0–0.5)
Eosinophils Relative: 3 %
HCT: 38.4 % (ref 36.0–46.0)
Hemoglobin: 11.9 g/dL — ABNORMAL LOW (ref 12.0–15.0)
Immature Granulocytes: 0 %
Lymphocytes Relative: 35 %
Lymphs Abs: 1.2 10*3/uL (ref 0.7–4.0)
MCH: 28.8 pg (ref 26.0–34.0)
MCHC: 31 g/dL (ref 30.0–36.0)
MCV: 93 fL (ref 80.0–100.0)
Monocytes Absolute: 0.3 10*3/uL (ref 0.1–1.0)
Monocytes Relative: 10 %
Neutro Abs: 1.8 10*3/uL (ref 1.7–7.7)
Neutrophils Relative %: 51 %
Platelets: 337 10*3/uL (ref 150–400)
RBC: 4.13 MIL/uL (ref 3.87–5.11)
RDW: 13.2 % (ref 11.5–15.5)
WBC: 3.5 10*3/uL — ABNORMAL LOW (ref 4.0–10.5)
nRBC: 0 % (ref 0.0–0.2)

## 2019-08-10 LAB — MAGNESIUM: Magnesium: 1.9 mg/dL (ref 1.7–2.4)

## 2019-08-10 LAB — TSH: TSH: 16.629 u[IU]/mL — ABNORMAL HIGH (ref 0.350–4.500)

## 2019-08-10 LAB — PREGNANCY, URINE: Preg Test, Ur: NEGATIVE

## 2019-08-10 MED ORDER — KETOROLAC TROMETHAMINE 30 MG/ML IJ SOLN
30.0000 mg | Freq: Once | INTRAMUSCULAR | Status: DC
  Filled 2019-08-10: qty 1

## 2019-08-10 MED ORDER — ONDANSETRON HCL 4 MG PO TABS: 4.0000 mg | ORAL_TABLET | Freq: Three times a day (TID) | ORAL | 0 refills | Status: DC | PRN

## 2019-08-10 MED ORDER — DIPHENHYDRAMINE HCL 50 MG/ML IJ SOLN
12.5000 mg | Freq: Once | INTRAMUSCULAR | Status: DC
  Filled 2019-08-10: qty 1

## 2019-08-10 MED ORDER — METOCLOPRAMIDE HCL 5 MG/ML IJ SOLN
10.0000 mg | Freq: Once | INTRAMUSCULAR | Status: DC
  Filled 2019-08-10: qty 2

## 2019-08-10 NOTE — Discharge Instructions (Addendum)
You were seen in the emergency department for evaluation of headache, nausea, insomnia and just generally not feeling well.  You had blood work EKG and urinalysis.  Your labs showed that your thyroid was low and you will likely need to start some medication.  Please contact your primary care doctor for close follow-up.  We are prescribing you some nausea medication to help with your symptoms.  Return to the emergency department if any worsening or concerning symptoms.

## 2019-08-10 NOTE — ED Triage Notes (Signed)
Pt sts for two weeks she has felt "trembly" inside, having a headache, nausea without vomiting, and heaviness in her chest. Pt says she hasn't slept since Saturday night. Has been tested with negative result for UTI, had thyroid checked but she sts they lost her bloodwork.

## 2019-08-10 NOTE — ED Notes (Signed)
Got patient into a gown on the monitor did ekg shown to Dr butler patient is resting with call bell in reach

## 2019-08-10 NOTE — ED Notes (Signed)
Patient verbalizes understanding of discharge instructions. Opportunity for questioning and answers were provided. Armband removed by staff, pt discharged from ED.  

## 2019-08-10 NOTE — ED Provider Notes (Signed)
[facility] EMERGENCY DEPARTMENT Provider Note   CSN: [id number] Arrival date & time: 08/10/19  7672     History No chief complaint on file. CC multiple complaints  Julia Fowler is a 34 y.o. female.  She is presenting with various complaints.  She said for at least 2 weeks maybe longer she has had intermittent headaches, nausea without vomiting, feeling her heart racing, poor sleeping.  She had abdominal pain talk to her gynecologist and she said she was negative for UTI.  She used to be on thyroid medication but she has not taken it for years.  She said she feels all trembly inside and feels like she needs to cry.  Denies any increase in stress from baseline.  The history is provided by the patient.  Headache Pain location:  Generalized Quality:  Dull Radiates to:  Does not radiate Severity currently:  5/10 Severity at highest:  5/10 Onset quality:  Gradual Timing:  Intermittent Progression:  Unchanged Chronicity:  New Relieved by:  None tried Worsened by:  Nothing Ineffective treatments:  None tried Associated symptoms: abdominal pain, diarrhea and nausea   Associated symptoms: no blurred vision, no cough, no dizziness, no drainage, no eye pain, no facial pain, no fever, no focal weakness, no hearing loss, no loss of balance, no myalgias, no neck pain, no neck stiffness, no numbness, no paresthesias, no photophobia, no seizures, no syncope, no visual change, no vomiting and no weakness        Past Medical History:  Diagnosis Date  . Hypothyroidism    no medication  . Rickets   . Seasonal allergies   . Thyroid disease hypothyroid    Patient Active Problem List   Diagnosis Date Noted  . Preeclampsia in postpartum period 12/09/2015  . S/P cesarean section 11/23/2015  . Twins 11/03/2015  . [redacted] weeks gestation of pregnancy   . Hereditary disease in family possibly affecting fetus, affecting management of mother, antepartum condition or complication   .  Cesarean delivery, without mention of indication, delivered, with or without mention of antepartum condition 01/21/2014  . Excessive fetal growth 12/28/2013  . History of C-section 07/14/2013  . Hypothyroid 04/10/2013  . BMI 37.0-37.9, adult 04/10/2013  . Rickets 04/10/2013  . Family history of breast cancer in first degree relative 04/10/2013    Past Surgical History:  Procedure Laterality Date  . ARTHROSCOPY KNEE W/ DRILLING Right 2018  . CESAREAN SECTION    . CESAREAN SECTION N/A 01/21/2014   Procedure: REPEAT CESAREAN SECTION;  Surgeon: Brock Bad, MD;  Location: [facility];  Service: Obstetrics;  Laterality: N/A;  . CESAREAN SECTION MULTI-GESTATIONAL N/A 11/23/2015   Procedure: CESAREAN SECTION MULTI-GESTATIONAL/TWINS;  Surgeon: Brock Bad, MD;  Location: [facility]IRTHING SUIT[facility];  Service: Obstetrics;  Laterality: N/A;  . TOOTH EXTRACTION Left      OB History    Gravida  4   Para  3   Term  3   Preterm  0   AB  1   Living  4     SAB  0   TAB  1   Ectopic  0   Multiple  1   Live Births  4           Family History  Problem Relation Age of Onset  . Cancer Sister        breast  . Diabetes Maternal Grandmother     Social History   Tobacco Use  . Smoking status: Never  Smoker  . Smokeless tobacco: Never Used  Substance Use Topics  . Alcohol use: No  . Drug use: No    Home Medications Prior to Admission medications   Medication Sig Start Date End Date Taking? Authorizing Provider  etonogestrel (NEXPLANON) 68 MG IMPL implant 1 each by Subdermal route once.    [provider]  loratadine (CLARITIN) 10 MG tablet TAKE 1 TABLET BY MOUTH EVERY DAY Patient taking differently: Take 10 mg by mouth daily as needed for allergies.  11/01/17   Brock Bad, MD  meloxicam (MOBIC) 7.5 MG tablet Take 1 tablet (7.5 mg total) by mouth daily. Patient not taking: Reported on 07/28/2019 11/21/18   Dahlia Byes A, NP  nitrofurantoin,  macrocrystal-monohydrate, (MACROBID) 100 MG capsule TAKE 1 CAPSULE (100 MG TOTAL) BY MOUTH 2 (TWO) TIMES DAILY FOR 7 DAYS Patient not taking: Reported on 07/28/2019 05/18/19   Brock Bad, MD  phenazopyridine (PYRIDIUM) 200 MG tablet Take 1 tablet (200 mg total) by mouth 3 (three) times daily as needed for pain. 12/31/18   Brock Bad, MD  traMADol (ULTRAM) 50 MG tablet Take 1 tablet (50 mg total) by mouth every 6 (six) hours as needed. Patient not taking: Reported on 07/28/2019 04/03/19   Brock Bad, MD    Allergies    Other  Review of Systems   Review of Systems  Constitutional: Negative for fever.  HENT: Negative for hearing loss and postnasal drip.   Eyes: Negative for blurred vision, photophobia and pain.  Respiratory: Negative for cough.   Cardiovascular: Positive for palpitations. Negative for syncope.  Gastrointestinal: Positive for abdominal pain, diarrhea and nausea. Negative for vomiting.  Genitourinary: Negative for dysuria.  Musculoskeletal: Negative for myalgias, neck pain and neck stiffness.  Skin: Negative for rash.  Neurological: Positive for tremors and headaches. Negative for dizziness, focal weakness, seizures, weakness, numbness, paresthesias and loss of balance.    Physical Exam Updated Vital Signs BP (!) 141/90 (BP Location: Right Arm)   Pulse 92   Temp 98.3 F (36.8 C) (Oral)   Resp 20   LMP 08/10/2019 (Exact Date)   SpO2 100%   Physical Exam Vitals and nursing note reviewed.  Constitutional:      General: She is not in acute distress.    Appearance: She is well-developed.  HENT:     Head: Normocephalic and atraumatic.  Eyes:     Conjunctiva/sclera: Conjunctivae normal.  Cardiovascular:     Rate and Rhythm: Normal rate and regular rhythm.     Heart sounds: No murmur.  Pulmonary:     Effort: Pulmonary effort is normal. No respiratory distress.     Breath sounds: Normal breath sounds.  Abdominal:     Palpations: Abdomen is soft.      Tenderness: There is no abdominal tenderness.  Musculoskeletal:        General: No deformity or signs of injury.     Cervical back: Neck supple.     Right lower leg: No edema.     Left lower leg: No edema.  Skin:    General: Skin is warm and dry.     Capillary Refill: Capillary refill takes less than 2 seconds.  Neurological:     General: No focal deficit present.     Mental Status: She is alert.     Sensory: No sensory deficit.     Motor: No weakness.     ED Results / Procedures / Treatments   Labs (all labs ordered  are listed, but only abnormal results are displayed) Labs Reviewed  COMPREHENSIVE METABOLIC PANEL - Abnormal; Notable for the following components:      Result Value   Calcium 8.7 (*)    All other components within normal limits  CBC WITH DIFFERENTIAL/PLATELET - Abnormal; Notable for the following components:   WBC 3.5 (*)    Hemoglobin 11.9 (*)    All other components within normal limits  URINALYSIS, ROUTINE W REFLEX MICROSCOPIC - Abnormal; Notable for the following components:   Hgb urine dipstick LARGE (*)    Protein, ur 30 (*)    Bacteria, UA FEW (*)    All other components within normal limits  TSH - Abnormal; Notable for the following components:   TSH 16.629 (*)    All other components within normal limits  PREGNANCY, URINE  MAGNESIUM    EKG None  Radiology No results found.  Procedures Procedures (including critical care time)  Medications Ordered in ED Medications - No data to display  ED Course  I have reviewed the triage vital signs and the nursing notes.  Pertinent labs & imaging results that were available during my care of the patient were reviewed by me and considered in my medical decision making (see chart for details).  Clinical Course as of Aug 09 1712  Mon Aug 09, 4060  6816 34 year old female with various complaints.  Differential includes arrhythmia, metabolic derangement, pregnancy, viral illness, stress.   [MB]    1000 Patient's labs showing urinalysis with 6-10 red cells 0-5 whites although she said she is having her period now.   [MB]  3244 Patient's blood work showed a depressed white count of 3.5.  Hemoglobin improved from her baseline.  Normal electrolytes other than a mild low calcium of 8.7.  TSH high at 16.6.   [MB]  1112 I offered to give patient some medication for headache although she said she is driving she would rather not.  I let her know about her thyroid testing and the need for follow-up.  She says her gynecologist usually helps her with these and she will call the office.  She asked if we could prescribe her some nausea medication.   [MB]    Clinical Course User Index [MB] Hayden Rasmussen, MD   MDM Rules/Calculators/A&P                     LISETT DIRUSSO was evaluated in Emergency Department on 08/10/2019 for the symptoms described in the history of present illness. She was evaluated in the context of the global COVID-19 pandemic, which necessitated consideration that the patient might be at risk for infection with the SARS-CoV-2 virus that causes COVID-19. Institutional protocols and algorithms that pertain to the evaluation of patients at risk for COVID-19 are in a state of rapid change based on information released by regulatory bodies including the CDC and federal and state organizations. These policies and algorithms were followed during the patient's care in the ED.  Final Clinical Impression(s) / ED Diagnoses Final diagnoses:  Generalized headache  Nausea  Insomnia, unspecified type  Hypothyroidism, unspecified type    Rx / DC Orders ED Discharge Orders         Ordered    ondansetron (ZOFRAN) 4 MG tablet  Every 8 hours PRN     08/10/19 1117           Hayden Rasmussen, MD 08/10/19 1715

## 2019-08-10 NOTE — ED Notes (Signed)
Took out patient saline lock patient is getting dress

## 2019-08-11 ENCOUNTER — Other Ambulatory Visit: Payer: Self-pay

## 2019-08-11 ENCOUNTER — Other Ambulatory Visit: Payer: Medicaid Other

## 2019-08-11 DIAGNOSIS — Z1329 Encounter for screening for other suspected endocrine disorder: Secondary | ICD-10-CM

## 2019-08-11 NOTE — Progress Notes (Signed)
Free T3 and T4 labs put in future per Dr. Jolayne Panther

## 2019-08-12 LAB — T3, FREE: T3, Free: 2.7 pg/mL (ref 2.0–4.4)

## 2019-08-12 LAB — T4, FREE: Free T4: 0.75 ng/dL — ABNORMAL LOW (ref 0.82–1.77)

## 2019-08-12 MED ORDER — LEVOTHYROXINE SODIUM 50 MCG PO TABS: 50.0000 ug | ORAL_TABLET | Freq: Every day | ORAL | 1 refills | Status: DC

## 2019-08-17 ENCOUNTER — Other Ambulatory Visit: Payer: Self-pay

## 2019-08-17 ENCOUNTER — Ambulatory Visit (HOSPITAL_COMMUNITY)
Admission: EM | Admit: 2019-08-17 | Discharge: 2019-08-17 | Disposition: A | Discharge status: Home / Self Care | Payer: Self-pay | Attending: Physician Assistant | Admitting: Physician Assistant

## 2019-08-17 ENCOUNTER — Encounter (HOSPITAL_COMMUNITY): Payer: Self-pay | Admitting: Emergency Medicine

## 2019-08-17 DIAGNOSIS — G8929 Other chronic pain: Secondary | ICD-10-CM

## 2019-08-17 DIAGNOSIS — M25561 Pain in right knee: Secondary | ICD-10-CM

## 2019-08-17 MED ORDER — IBUPROFEN 800 MG PO TABS: 800.0000 mg | ORAL_TABLET | Freq: Three times a day (TID) | ORAL | 0 refills | Status: DC

## 2019-08-17 MED ORDER — TRAMADOL HCL 50 MG PO TABS: 50.0000 mg | ORAL_TABLET | Freq: Four times a day (QID) | ORAL | 0 refills | Status: DC | PRN

## 2019-08-17 MED ORDER — IBUPROFEN 800 MG PO TABS: 800.0000 mg | ORAL_TABLET | Freq: Three times a day (TID) | ORAL | 0 refills | Status: AC

## 2019-08-17 NOTE — ED Provider Notes (Signed)
[facility]    CSN: [id number] Arrival date & time: 08/17/19  1216      History   Chief Complaint Chief Complaint  Patient presents with  . Knee Pain    right    HPI Julia Fowler is a 34 y.o. female.   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 She states pain and swelling has been worse for about the last 4 days or so. She has chronic pain in her right knee, however for some reason it has been worsening lately. She has had a meniscus repair in this knee previous and had joint injections. She was previously under the care of an orthopedic surgeon and has attempted to be seen again by them, however due to no insurance and needing a referral she has not been able to have this appointment. Her pain has been managed on tramadol by her Gynecology provider. She reports currently the pain is so much that it is making it difficult to walk. She has tried over the counter medications without much improvement. She is concerned because there seems to be more swelling this time as compared to previous episodes of acute flares. She denies redness, fever or chills. Denies swelling below her knee or above the knee.      Past Medical History:  Diagnosis Date  . Hypothyroidism    no medication  . Rickets   . Seasonal allergies   . Thyroid disease hypothyroid    Patient Active Problem List   Diagnosis Date Noted  . Preeclampsia in postpartum period 12/09/2015  . S/P cesarean section 11/23/2015  . Twins 11/03/2015  . [redacted] weeks gestation of pregnancy   . Hereditary disease in family possibly affecting fetus, affecting management of mother, antepartum condition or complication   . Cesarean delivery, without mention of indication, delivered, with or without mention of antepartum condition 01/21/2014  . Excessive fetal growth 12/28/2013  . History of C-section 07/14/2013  . Hypothyroid  04/10/2013  . BMI 37.0-37.9, adult 04/10/2013  . Rickets 04/10/2013  . Family history of breast cancer in first degree relative 04/10/2013    Past Surgical History:  Procedure Laterality Date  . ARTHROSCOPY KNEE W/ DRILLING Right 2018  . CESAREAN SECTION    . CESAREAN SECTION N/A 01/21/2014   Procedure: REPEAT CESAREAN SECTION;  Surgeon: Brock Bad, MD;  Location: [facility];  Service: Obstetrics;  Laterality: N/A;  . CESAREAN SECTION MULTI-GESTATIONAL N/A 11/23/2015   Procedure: CESAREAN SECTION MULTI-GESTATIONAL/TWINS;  Surgeon: Brock Bad, MD;  Location: [facility] BIRTHING SUITES;  Service: Obstetrics;  Laterality: N/A;  . TOOTH EXTRACTION Left     OB History    Gravida  4   Para  3   Term  3   Preterm  0   AB  1   Living  4     SAB  0   TAB  1   Ectopic  0   Multiple  1   Live Births  4            Home Medications    Prior to Admission medications   Medication Sig Start Date End Date Taking? Authorizing Provider  etonogestrel (NEXPLANON) 68 MG IMPL implant 1 each by Subdermal route once.   Yes [provider]  levothyroxine (SYNTHROID) 50 MCG tablet Take 1 tablet (50 mcg total) by mouth daily before breakfast. 08/12/19  Yes Constant, Gigi Gin, MD  ibuprofen (  ADVIL) 800 MG tablet Take 1 tablet (800 mg total) by mouth 3 (three) times daily for 10 days. 08/17/19 08/27/19  Oluwasemilore Bahl, Veryl Speak, PA-C  loratadine (CLARITIN) 10 MG tablet TAKE 1 TABLET BY MOUTH EVERY DAY Patient not taking: No sig reported 11/01/17   Brock Bad, MD  meloxicam (MOBIC) 7.5 MG tablet Take 1 tablet (7.5 mg total) by mouth daily. Patient not taking: Reported on 07/28/2019 11/21/18   Dahlia Byes A, NP  naproxen sodium (ALEVE) 220 MG tablet Take 220 mg by mouth daily as needed (pain).    [provider]  nitrofurantoin, macrocrystal-monohydrate, (MACROBID) 100 MG capsule TAKE 1 CAPSULE (100 MG TOTAL) BY MOUTH 2 (TWO) TIMES DAILY FOR 7 DAYS Patient not taking: Reported on  07/28/2019 05/18/19   Brock Bad, MD  ondansetron (ZOFRAN) 4 MG tablet Take 1 tablet (4 mg total) by mouth every 8 (eight) hours as needed for nausea or vomiting. 08/10/19   Terrilee Files, MD  phenazopyridine (PYRIDIUM) 200 MG tablet Take 1 tablet (200 mg total) by mouth 3 (three) times daily as needed for pain. Patient not taking: Reported on 08/10/2019 12/31/18   Brock Bad, MD  traMADol (ULTRAM) 50 MG tablet Take 1 tablet (50 mg total) by mouth every 6 (six) hours as needed for moderate pain. 08/17/19   Loring Liskey, Veryl Speak, PA-C    Family History Family History  Problem Relation Age of Onset  . Cancer Sister        breast  . Diabetes Maternal Grandmother     Social History Social History   Tobacco Use  . Smoking status: Never Smoker  . Smokeless tobacco: Never Used  Substance Use Topics  . Alcohol use: No  . Drug use: No     Allergies   Other   Review of Systems Review of Systems  Musculoskeletal: Positive for arthralgias, gait problem and joint swelling.  Skin: Negative for color change and rash.  All other systems reviewed and are negative.    Physical Exam Triage Vital Signs ED Triage Vitals  Enc Vitals Group     BP 08/17/19 1233 113/70     Pulse Rate 08/17/19 1233 87     Resp 08/17/19 1233 14     Temp 08/17/19 1233 98.3 F (36.8 C)     Temp Source 08/17/19 1233 Oral     SpO2 08/17/19 1233 100 %     Weight --      Height --      Head Circumference --      Peak Flow --      Pain Score 08/17/19 1234 8     Pain Loc --      Pain Edu? --      Excl. in GC? --    No data found.  Updated Vital Signs BP 113/70 (BP Location: Right Arm)   Pulse 87   Temp 98.3 F (36.8 C) (Oral)   Resp 14   LMP 08/10/2019 (Exact Date)   SpO2 100%   Visual Acuity Right Eye Distance:   Left Eye Distance:   Bilateral Distance:    Right Eye Near:   Left Eye Near:    Bilateral Near:     Physical Exam Vitals and nursing note reviewed.  Constitutional:       General: She is not in acute distress.    Appearance: She is well-developed. She is obese.  HENT:     Head: Normocephalic and atraumatic.  Eyes:  General: No scleral icterus.    Conjunctiva/sclera: Conjunctivae normal.  Cardiovascular:     Rate and Rhythm: Normal rate.  Pulmonary:     Effort: Pulmonary effort is normal. No respiratory distress.  Musculoskeletal:     Cervical back: Neck supple.     Right lower leg: No edema.     Left lower leg: No edema.     Comments: Right knee- able to extend fully, pain elicited beyond 90 degrees of flexion. TTP medial and lateral to patella. Exam severally limited due to body habitus and extent of effusion or edema when compared to left is difficult to determine  Antalgic gait noted with pigeon towing of right lower leg  Skin:    General: Skin is warm and dry.     Findings: No erythema.  Neurological:     General: No focal deficit present.     Mental Status: She is alert and oriented to person, place, and time.     Gait: Gait abnormal (2/2 pain).  Psychiatric:        Mood and Affect: Mood normal.        Behavior: Behavior normal.        Judgment: Judgment normal.      UC Treatments / Results  Labs (all labs ordered are listed, but only abnormal results are displayed) Labs Reviewed - No data to display  EKG   Radiology No results found.  Procedures Procedures (including critical care time)  Medications Ordered in UC Medications - No data to display  Initial Impression / Assessment and Plan / UC Course  I have reviewed the triage vital signs and the nursing notes.  Pertinent labs & imaging results that were available during my care of the patient were reviewed by me and considered in my medical decision making (see chart for details).     #Chronic Right Knee pain Acute flare on chronic issue. Ultimately she will need orthopedic follow up. Given lack of PCP for referral, I recommended she go to Orthopedic Walk in clinic  hours, as they may consider joint injection for more temporizing measures. Given known history of arthritic changes on xray, and no new injury, did not feel warranted at this time. - tramadol prescribed - scheduled 800mg  ibuprofen given some evidence showing inflammatory aspect to knee arthritis. - follow up with PCP and orthopedics for definitive management  Final Clinical Impressions(s) / UC Diagnoses   Final diagnoses:  Chronic pain of right knee     Discharge Instructions     Take the tramadol as directed  Take 1 800 mg ibuprofen every 8 hours for 3 days, and then as needed   and [facility] Pont have urgent care walk in hours from 530-9PM during the week, please consider walking in today or tomorrow  If you notice worsening swelling in your right leg, please go to the emergency department to be further evaluated.      ED Prescriptions    Medication Sig Dispense Auth. Provider   traMADol (ULTRAM) 50 MG tablet Take 1 tablet (50 mg total) by mouth every 6 (six) hours as needed for moderate pain. 15 tablet Kema Santaella, Thurston Hole, PA-C   ibuprofen (ADVIL) 800 MG tablet  (Status: Discontinued) Take 1 tablet (800 mg total) by mouth 3 (three) times daily. 21 tablet Tarig Zimmers, Veryl Speak, PA-C   ibuprofen (ADVIL) 800 MG tablet Take 1 tablet (800 mg total) by mouth 3 (three) times daily for 10 days. 30 tablet Addy Mcmannis, Veryl Speak, PA-C  I have reviewed the PDMP during this encounter.   Purnell Shoemaker, PA-C 08/17/19 2352

## 2019-08-17 NOTE — Discharge Instructions (Addendum)
Take the tramadol as directed  Take 1 800 mg ibuprofen every 8 hours for 3 days, and then as needed   Julia Fowler and Julia Fowler have urgent care walk in hours from 530-9PM during the week, please consider walking in today or tomorrow  If you notice worsening swelling in your right leg, please go to the emergency department to be further evaluated.

## 2019-08-17 NOTE — ED Triage Notes (Signed)
Pt reports right knee pain and swelling that started on Friday.  Pt had an injury to this knee over 2 years ago.

## 2019-09-03 ENCOUNTER — Other Ambulatory Visit: Payer: Self-pay | Admitting: Obstetrics and Gynecology

## 2019-09-15 ENCOUNTER — Other Ambulatory Visit: Payer: Self-pay | Admitting: Obstetrics and Gynecology

## 2019-09-28 ENCOUNTER — Other Ambulatory Visit: Payer: Medicaid Other

## 2019-09-30 ENCOUNTER — Other Ambulatory Visit: Payer: Medicaid Other

## 2019-10-02 ENCOUNTER — Ambulatory Visit: Payer: Medicaid Other | Attending: Internal Medicine

## 2019-10-02 DIAGNOSIS — Z20822 Contact with and (suspected) exposure to covid-19: Secondary | ICD-10-CM

## 2019-10-03 LAB — NOVEL CORONAVIRUS, NAA: SARS-CoV-2, NAA: NOT DETECTED

## 2019-10-03 LAB — SARS-COV-2, NAA 2 DAY TAT

## 2019-12-05 ENCOUNTER — Other Ambulatory Visit: Payer: Self-pay | Admitting: Obstetrics and Gynecology

## 2019-12-15 ENCOUNTER — Ambulatory Visit (INDEPENDENT_AMBULATORY_CARE_PROVIDER_SITE_OTHER): Payer: Medicaid Other | Admitting: Obstetrics

## 2019-12-15 ENCOUNTER — Encounter: Payer: Self-pay | Admitting: Obstetrics

## 2019-12-15 ENCOUNTER — Other Ambulatory Visit: Payer: Self-pay

## 2019-12-15 VITALS — BP 118/76 | HR 83 | Ht 59.0 in | Wt 224.0 lb

## 2019-12-15 DIAGNOSIS — B3731 Acute candidiasis of vulva and vagina: Secondary | ICD-10-CM

## 2019-12-15 DIAGNOSIS — R300 Dysuria: Secondary | ICD-10-CM | POA: Diagnosis not present

## 2019-12-15 DIAGNOSIS — B373 Candidiasis of vulva and vagina: Secondary | ICD-10-CM

## 2019-12-15 DIAGNOSIS — R319 Hematuria, unspecified: Secondary | ICD-10-CM

## 2019-12-15 DIAGNOSIS — R110 Nausea: Secondary | ICD-10-CM | POA: Diagnosis not present

## 2019-12-15 DIAGNOSIS — M549 Dorsalgia, unspecified: Secondary | ICD-10-CM

## 2019-12-15 LAB — POCT URINALYSIS DIPSTICK
Bilirubin, UA: NEGATIVE
Blood, UA: POSITIVE
Glucose, UA: NEGATIVE
Ketones, UA: NEGATIVE
Leukocytes, UA: NEGATIVE
Nitrite, UA: NEGATIVE
Protein, UA: NEGATIVE
Spec Grav, UA: 1.015 (ref 1.010–1.025)
Urobilinogen, UA: 0.2 E.U./dL
pH, UA: 5 (ref 5.0–8.0)

## 2019-12-15 MED ORDER — CEFUROXIME AXETIL 500 MG PO TABS: 500.0000 mg | ORAL_TABLET | Freq: Two times a day (BID) | ORAL | 0 refills | Status: DC

## 2019-12-15 MED ORDER — FLUCONAZOLE 150 MG PO TABS: 150.0000 mg | ORAL_TABLET | Freq: Once | ORAL | 0 refills | Status: AC

## 2019-12-15 MED ORDER — ONDANSETRON HCL 4 MG PO TABS: 4.0000 mg | ORAL_TABLET | Freq: Three times a day (TID) | ORAL | 2 refills | Status: DC | PRN

## 2019-12-15 MED ORDER — OXYCODONE-ACETAMINOPHEN 10-325 MG PO TABS: 1.0000 | ORAL_TABLET | ORAL | 0 refills | Status: DC | PRN

## 2019-12-15 NOTE — Progress Notes (Signed)
Pt is in the office for possible uti, pt states that this has been a recurrent issue. Pt reports low back and neck pain, nausea, headaches, and odor with urination.

## 2019-12-15 NOTE — Progress Notes (Signed)
Patient ID: Julia Fowler, female   DOB: 1986-02-12, 34 y.o.   MRN: [id number]  Chief Complaint  Patient presents with  . GYN    HPI Julia Fowler is a 34 y.o. female.  Complains of severe backache and pain with urination.  Also has neck pain and headache. HPI  Past Medical History:  Diagnosis Date  . Hypothyroidism    no medication  . Rickets   . Seasonal allergies   . Thyroid disease hypothyroid    Past Surgical History:  Procedure Laterality Date  . ARTHROSCOPY KNEE W/ DRILLING Right 2018  . CESAREAN SECTION    . CESAREAN SECTION N/A 01/21/2014   Procedure: REPEAT CESAREAN SECTION;  Surgeon: Brock Bad, MD;  Location: [facility];  Service: Obstetrics;  Laterality: N/A;  . CESAREAN SECTION MULTI-GESTATIONAL N/A 11/23/2015   Procedure: CESAREAN SECTION MULTI-GESTATIONAL/TWINS;  Surgeon: Brock Bad, MD;  Location: [facility] BIRTHING SUITES;  Service: Obstetrics;  Laterality: N/A;  . TOOTH EXTRACTION Left     Family History  Problem Relation Age of Onset  . Cancer Sister        breast  . Diabetes Maternal Grandmother     Social History Social History   Tobacco Use  . Smoking status: Never Smoker  . Smokeless tobacco: Never Used  Substance Use Topics  . Alcohol use: No  . Drug use: No    Allergies  Allergen Reactions  . Other Other (See Comments) and Cough    Pt states that she is allergic to cats/dogs.   Reaction:  Itchy, watery eyes     Current Outpatient Medications  Medication Sig Dispense Refill  . etonogestrel (NEXPLANON) 68 MG IMPL implant 1 each by Subdermal route once.    Marland Kitchen levothyroxine (SYNTHROID) 50 MCG tablet TAKE 1 TABLET BY MOUTH DAILY BEFORE BREAKFAST (Patient not taking: Reported on 12/15/2019) 90 tablet 0  . cefUROXime (CEFTIN) 500 MG tablet Take 1 tablet (500 mg total) by mouth 2 (two) times daily with a meal. 14 tablet 0  . fluconazole (DIFLUCAN) 150 MG tablet Take 1 tablet (150 mg total) by mouth once for 1 dose. 1 tablet 0  . loratadine  (CLARITIN) 10 MG tablet TAKE 1 TABLET BY MOUTH EVERY DAY (Patient not taking: No sig reported) 30 tablet 11  . meloxicam (MOBIC) 7.5 MG tablet Take 1 tablet (7.5 mg total) by mouth daily. (Patient not taking: Reported on 07/28/2019) 30 tablet 0  . naproxen sodium (ALEVE) 220 MG tablet Take 220 mg by mouth daily as needed (pain).    . nitrofurantoin, macrocrystal-monohydrate, (MACROBID) 100 MG capsule TAKE 1 CAPSULE (100 MG TOTAL) BY MOUTH 2 (TWO) TIMES DAILY FOR 7 DAYS (Patient not taking: Reported on 07/28/2019) 14 capsule 2  . ondansetron (ZOFRAN) 4 MG tablet Take 1 tablet (4 mg total) by mouth every 8 (eight) hours as needed for nausea or vomiting. 30 tablet 2  . oxyCODONE-acetaminophen (PERCOCET) 10-325 MG tablet Take 1 tablet by mouth every 4 (four) hours as needed for pain. 30 tablet 0  . phenazopyridine (PYRIDIUM) 200 MG tablet Take 1 tablet (200 mg total) by mouth 3 (three) times daily as needed for pain. (Patient not taking: Reported on 08/10/2019) 10 tablet 0  . traMADol (ULTRAM) 50 MG tablet Take 1 tablet (50 mg total) by mouth every 6 (six) hours as needed for moderate pain. (Patient not taking: Reported on 12/15/2019) 15 tablet 0   No current facility-administered medications for this visit.    Review of  Systems Review of Systems Constitutional: negative for fatigue and weight loss Respiratory: negative for cough and wheezing Cardiovascular: negative for chest pain, fatigue and palpitations Gastrointestinal: negative for abdominal pain and change in bowel habits Genitourinary:negative Integument/breast: negative for nipple discharge Musculoskeletal:positive for left flank pain and neck pain Neurological: positive for headache Behavioral/Psych: negative for abusive relationship, depression Endocrine: negative for temperature intolerance      Blood pressure 118/76, pulse 83, height 4\' 11"  (1.499 m), weight 224 lb (101.6 kg).  Physical Exam Physical Exam           General: Alert and  no distress          Back: positive left CVA tenderness Abdomen:  normal findings: no organomegaly, soft, non-tender and no hernia  Pelvis:  External genitalia: normal general appearance Urinary system: urethral meatus normal and bladder without fullness, nontender Vaginal: normal without tenderness, induration or masses Cervix: normal appearance Adnexa: normal bimanual exam Uterus: anteverted and non-tender, normal size    50% of 15 min visit spent on counseling and coordination of care.   Data Reviewed Urinalysis: positive for RBC's  Assessment       1. Dysuria Rx: - POCT Urinalysis Dipstick - cefUROXime (CEFTIN) 500 MG tablet; Take 1 tablet (500 mg total) by mouth 2 (two) times daily with a meal.  Dispense: 14 tablet; Refill: 0 - Urine Culture  2. Backache symptom Rx: - oxyCODONE-acetaminophen (PERCOCET) 10-325 MG tablet; Take 1 tablet by mouth every 4 (four) hours as needed for pain.  Dispense: 30 tablet; Refill: 0  3. Hematuria, unspecified type - recurrent, without UTI in the past - may need a CT of Kidneys for stones - may need Urology evaluation  4. Nausea Rx: - ondansetron (ZOFRAN) 4 MG tablet; Take 1 tablet (4 mg total) by mouth every 8 (eight) hours as needed for nausea or vomiting.  Dispense: 30 tablet; Refill: 2  5. Candida vaginitis Rx: - fluconazole (DIFLUCAN) 150 MG tablet; Take 1 tablet (150 mg total) by mouth once for 1 dose.  Dispense: 1 tablet; Refill: 0  Plan   Follow up prn   Orders Placed This Encounter  Procedures  . Urine Culture    Order Specific Question:   Source    Answer:   urine  . POCT Urinalysis Dipstick   Meds ordered this encounter  Medications  . cefUROXime (CEFTIN) 500 MG tablet    Sig: Take 1 tablet (500 mg total) by mouth 2 (two) times daily with a meal.    Dispense:  14 tablet    Refill:  0  . oxyCODONE-acetaminophen (PERCOCET) 10-325 MG tablet    Sig: Take 1 tablet by mouth every 4 (four) hours as needed for pain.     Dispense:  30 tablet    Refill:  0  . fluconazole (DIFLUCAN) 150 MG tablet    Sig: Take 1 tablet (150 mg total) by mouth once for 1 dose.    Dispense:  1 tablet    Refill:  0  . ondansetron (ZOFRAN) 4 MG tablet    Sig: Take 1 tablet (4 mg total) by mouth every 8 (eight) hours as needed for nausea or vomiting.    Dispense:  30 tablet    Refill:  2     Shelly Bombard, MD 12/15/2019 3:01 PM

## 2019-12-17 LAB — URINE CULTURE

## 2020-01-01 ENCOUNTER — Emergency Department (HOSPITAL_COMMUNITY): Payer: 59

## 2020-01-01 ENCOUNTER — Encounter (HOSPITAL_COMMUNITY): Payer: Self-pay

## 2020-01-01 ENCOUNTER — Emergency Department (HOSPITAL_COMMUNITY)
Admission: EM | Admit: 2020-01-01 | Discharge: 2020-01-01 | Disposition: A | Discharge status: Home / Self Care | Payer: 59 | Attending: Emergency Medicine | Admitting: Emergency Medicine

## 2020-01-01 ENCOUNTER — Other Ambulatory Visit: Payer: Self-pay

## 2020-01-01 DIAGNOSIS — E039 Hypothyroidism, unspecified: Secondary | ICD-10-CM | POA: Diagnosis not present

## 2020-01-01 DIAGNOSIS — M545 Low back pain, unspecified: Secondary | ICD-10-CM

## 2020-01-01 DIAGNOSIS — R1030 Lower abdominal pain, unspecified: Secondary | ICD-10-CM | POA: Diagnosis not present

## 2020-01-01 DIAGNOSIS — R319 Hematuria, unspecified: Secondary | ICD-10-CM | POA: Insufficient documentation

## 2020-01-01 LAB — URINALYSIS, ROUTINE W REFLEX MICROSCOPIC
Bacteria, UA: NONE SEEN
Bilirubin Urine: NEGATIVE
Glucose, UA: NEGATIVE mg/dL
Ketones, ur: NEGATIVE mg/dL
Leukocytes,Ua: NEGATIVE
Nitrite: NEGATIVE
Protein, ur: NEGATIVE mg/dL
Specific Gravity, Urine: 1.021 (ref 1.005–1.030)
pH: 5 (ref 5.0–8.0)

## 2020-01-01 LAB — COMPREHENSIVE METABOLIC PANEL
ALT: 13 U/L (ref 0–44)
AST: 18 U/L (ref 15–41)
Albumin: 3.3 g/dL — ABNORMAL LOW (ref 3.5–5.0)
Alkaline Phosphatase: 63 U/L (ref 38–126)
Anion gap: 8 (ref 5–15)
BUN: 14 mg/dL (ref 6–20)
CO2: 23 mmol/L (ref 22–32)
Calcium: 8.5 mg/dL — ABNORMAL LOW (ref 8.9–10.3)
Chloride: 107 mmol/L (ref 98–111)
Creatinine, Ser: 0.45 mg/dL (ref 0.44–1.00)
GFR calc Af Amer: >60 mL/min (ref >60)
GFR calc non Af Amer: >60 mL/min (ref >60)
Glucose, Bld: 86 mg/dL (ref 70–99)
Potassium: 3.5 mmol/L (ref 3.5–5.1)
Sodium: 138 mmol/L (ref 135–145)
Total Bilirubin: 0.8 mg/dL (ref 0.3–1.2)
Total Protein: 7.1 g/dL (ref 6.5–8.1)

## 2020-01-01 LAB — CBC
HCT: 37.2 % (ref 36.0–46.0)
Hemoglobin: 11.6 g/dL — ABNORMAL LOW (ref 12.0–15.0)
MCH: 29.4 pg (ref 26.0–34.0)
MCHC: 31.2 g/dL (ref 30.0–36.0)
MCV: 94.4 fL (ref 80.0–100.0)
Platelets: 300 10*3/uL (ref 150–400)
RBC: 3.94 MIL/uL (ref 3.87–5.11)
RDW: 13.2 % (ref 11.5–15.5)
WBC: 3.6 10*3/uL — ABNORMAL LOW (ref 4.0–10.5)
nRBC: 0 % (ref 0.0–0.2)

## 2020-01-01 LAB — I-STAT BETA HCG BLOOD, ED (MC, WL, AP ONLY): I-stat hCG, quantitative: <5 m[IU]/mL (ref <5)

## 2020-01-01 LAB — LIPASE, BLOOD: Lipase: 19 U/L (ref 11–51)

## 2020-01-01 MED ORDER — IOHEXOL 300 MG/ML  SOLN
100.0000 mL | Freq: Once | INTRAMUSCULAR | Status: AC | PRN
  Administered 2020-01-01: 100 mL via INTRAVENOUS

## 2020-01-01 MED ORDER — ONDANSETRON HCL 4 MG/2ML IJ SOLN
4.0000 mg | Freq: Once | INTRAMUSCULAR | Status: AC
  Administered 2020-01-01: 4 mg via INTRAVENOUS
  Filled 2020-01-01: qty 2

## 2020-01-01 MED ORDER — METHOCARBAMOL 500 MG PO TABS: 500.0000 mg | ORAL_TABLET | Freq: Four times a day (QID) | ORAL | 0 refills | Status: DC

## 2020-01-01 MED ORDER — IBUPROFEN 800 MG PO TABS
800.0000 mg | ORAL_TABLET | Freq: Three times a day (TID) | ORAL | 0 refills | Status: DC | PRN
Start: 2020-01-01 — End: 2021-02-23

## 2020-01-01 MED ORDER — HYDROMORPHONE HCL 1 MG/ML IJ SOLN
0.5000 mg | Freq: Once | INTRAMUSCULAR | Status: AC
  Administered 2020-01-01: 0.5 mg via INTRAVENOUS
  Filled 2020-01-01: qty 1

## 2020-01-01 NOTE — ED Notes (Signed)
Patient transported to CT 

## 2020-01-01 NOTE — ED Provider Notes (Signed)
[facility] EMERGENCY DEPARTMENT Provider Note   CSN: [id number] Arrival date & time: 01/01/20  4401     History Chief Complaint  Patient presents with  . Back Pain    Julia Fowler is a 34 y.o. female.  The history is provided by the patient. No language interpreter was used.  Back Pain Location:  Lumbar spine Quality:  Aching Pain is:  Same all the time Onset quality:  Gradual Progression:  Worsening Chronicity:  New Relieved by:  Nothing Worsened by:  Nothing Ineffective treatments:  None tried Associated symptoms: abdominal pain   Associated symptoms: no pelvic pain   Risk factors: no menopause    Pt has pain in low back that radiates around to both sides of her abdomen.  Pt saw Dr. Clearance Coots in the office and had a ua which showed some blood     Past Medical History:  Diagnosis Date  . Hypothyroidism    no medication  . Rickets   . Seasonal allergies   . Thyroid disease hypothyroid    Patient Active Problem List   Diagnosis Date Noted  . Preeclampsia in postpartum period 12/09/2015  . S/P cesarean section 11/23/2015  . Twins 11/03/2015  . [redacted] weeks gestation of pregnancy   . Hereditary disease in family possibly affecting fetus, affecting management of mother, antepartum condition or complication   . Cesarean delivery, without mention of indication, delivered, with or without mention of antepartum condition 01/21/2014  . Excessive fetal growth 12/28/2013  . History of C-section 07/14/2013  . Hypothyroid 04/10/2013  . BMI 37.0-37.9, adult 04/10/2013  . Rickets 04/10/2013  . Family history of breast cancer in first degree relative 04/10/2013    Past Surgical History:  Procedure Laterality Date  . ARTHROSCOPY KNEE W/ DRILLING Right 2018  . CESAREAN SECTION    . CESAREAN SECTION N/A 01/21/2014   Procedure: REPEAT CESAREAN SECTION;  Surgeon: Brock Bad, MD;  Location: [facility];  Service: Obstetrics;  Laterality: N/A;  . CESAREAN  SECTION MULTI-GESTATIONAL N/A 11/23/2015   Procedure: CESAREAN SECTION MULTI-GESTATIONAL/TWINS;  Surgeon: Brock Bad, MD;  Location: [facility] BIRTHING SUITES;  Service: Obstetrics;  Laterality: N/A;  . TOOTH EXTRACTION Left      OB History    Gravida  4   Para  3   Term  3   Preterm  0   AB  1   Living  4     SAB  0   TAB  1   Ectopic  0   Multiple  1   Live Births  4           Family History  Problem Relation Age of Onset  . Cancer Sister        breast  . Diabetes Maternal Grandmother     Social History   Tobacco Use  . Smoking status: Never Smoker  . Smokeless tobacco: Never Used  Vaping Use  . Vaping Use: Never used  Substance Use Topics  . Alcohol use: No  . Drug use: No    Home Medications Prior to Admission medications   Medication Sig Start Date End Date Taking? Authorizing Provider  etonogestrel (NEXPLANON) 68 MG IMPL implant 1 each by Subdermal route once.   Yes [provider]  ibuprofen (ADVIL) 800 MG tablet Take 800 mg by mouth 3 (three) times daily. 12/22/19  Yes [provider]  levothyroxine (SYNTHROID) 75 MCG tablet Take 75 mcg by mouth daily. 12/22/19  Yes  [provider]  loratadine (CLARITIN) 10 MG tablet TAKE 1 TABLET BY MOUTH EVERY DAY 11/01/17  Yes Brock Bad, MD  ondansetron (ZOFRAN) 4 MG tablet Take 1 tablet (4 mg total) by mouth every 8 (eight) hours as needed for nausea or vomiting. 12/15/19  Yes Brock Bad, MD  meloxicam (MOBIC) 7.5 MG tablet Take 1 tablet (7.5 mg total) by mouth daily. Patient not taking: Reported on 07/28/2019 11/21/18   Dahlia Byes A, NP  oxyCODONE-acetaminophen (PERCOCET) 10-325 MG tablet Take 1 tablet by mouth every 4 (four) hours as needed for pain. Patient not taking: Reported on 01/01/2020 12/15/19   Brock Bad, MD  phenazopyridine (PYRIDIUM) 200 MG tablet Take 1 tablet (200 mg total) by mouth 3 (three) times daily as needed for pain. Patient not taking: Reported  on 08/10/2019 12/31/18   Brock Bad, MD  traMADol (ULTRAM) 50 MG tablet Take 1 tablet (50 mg total) by mouth every 6 (six) hours as needed for moderate pain. Patient not taking: Reported on 12/15/2019 08/17/19   Darr, Veryl Speak, PA-C    Allergies    Other  Review of Systems   Review of Systems  Gastrointestinal: Positive for abdominal pain.  Genitourinary: Negative for pelvic pain.  Musculoskeletal: Positive for back pain.  All other systems reviewed and are negative.   Physical Exam Updated Vital Signs BP 121/62   Pulse 61   Temp 98.3 F (36.8 C) (Oral)   Resp 19   Ht 4\' 11"  (1.499 m)   Wt 101.2 kg   LMP 12/05/2019   SpO2 100%   BMI 45.04 kg/m   Physical Exam Vitals and nursing note reviewed.  Constitutional:      Appearance: She is well-developed.  HENT:     Head: Normocephalic.     Mouth/Throat:     Mouth: Mucous membranes are moist.  Cardiovascular:     Rate and Rhythm: Normal rate and regular rhythm.  Pulmonary:     Effort: Pulmonary effort is normal.     Breath sounds: Normal breath sounds.  Abdominal:     General: Abdomen is flat. There is no distension.  Musculoskeletal:        General: Normal range of motion.     Cervical back: Normal range of motion.  Skin:    General: Skin is warm.  Neurological:     General: No focal deficit present.     Mental Status: She is alert and oriented to person, place, and time.  Psychiatric:        Mood and Affect: Mood normal.     ED Results / Procedures / Treatments   Labs (all labs ordered are listed, but only abnormal results are displayed) Labs Reviewed  COMPREHENSIVE METABOLIC PANEL - Abnormal; Notable for the following components:      Result Value   Calcium 8.5 (*)    Albumin 3.3 (*)    All other components within normal limits  CBC - Abnormal; Notable for the following components:   WBC 3.6 (*)    Hemoglobin 11.6 (*)    All other components within normal limits  URINALYSIS, ROUTINE W REFLEX  MICROSCOPIC - Abnormal; Notable for the following components:   Hgb urine dipstick LARGE (*)    All other components within normal limits  LIPASE, BLOOD  I-STAT BETA HCG BLOOD, ED ([facility], [facility], [facility] ONLY)    EKG None  Radiology CT ABDOMEN PELVIS W CONTRAST  Result Date: 01/01/2020 CLINICAL DATA:  Mid and  low back pain for 1 month. EXAM: CT ABDOMEN AND PELVIS WITH CONTRAST TECHNIQUE: Multidetector CT imaging of the abdomen and pelvis was performed using the standard protocol following bolus administration of intravenous contrast. CONTRAST:  100 mL OMNIPAQUE IOHEXOL 300 MG/ML  SOLN COMPARISON:  None. FINDINGS: Lower chest: Lung bases clear.  No pleural or pericardial effusion. Hepatobiliary: No focal liver abnormality is seen. No gallstones, gallbladder wall thickening, or biliary dilatation. Pancreas: Unremarkable. No pancreatic ductal dilatation or surrounding inflammatory changes. Spleen: Normal in size without focal abnormality. Adrenals/Urinary Tract: Adrenal glands are unremarkable. Kidneys are normal, without renal calculi, focal lesion, or hydronephrosis. Bladder is unremarkable. Stomach/Bowel: Stomach is within normal limits. Appendix appears normal. No evidence of bowel wall thickening, distention, or inflammatory changes. Vascular/Lymphatic: No significant vascular findings are present. No enlarged abdominal or pelvic lymph nodes. Reproductive: 3.5 cm simple left ovarian cyst is incidentally noted. No follow-up imaging is indicated. Uterus and right ovary are also normal in appearance. Other: Very small fat containing umbilical hernia is seen. Musculoskeletal: No acute or focal abnormality. IMPRESSION: No acute abnormality or finding to explain the patient's symptoms. Small fat containing umbilical hernia. Electronically Signed   By: Inge Rise M.D.   On: 01/01/2020 13:56    Procedures Procedures (including critical care time)  Medications Ordered in ED Medications  HYDROmorphone  (DILAUDID) injection 0.5 mg (0.5 mg Intravenous Given 01/01/20 0945)  ondansetron (ZOFRAN) injection 4 mg (4 mg Intravenous Given 01/01/20 0944)  iohexol (OMNIPAQUE) 300 MG/ML solution 100 mL (100 mLs Intravenous Contrast Given 01/01/20 1328)    ED Course  I have reviewed the triage vital signs and the nursing notes.  Pertinent labs & imaging results that were available during my care of the patient were reviewed by me and considered in my medical decision making (see chart for details).    MDM Rules/Calculators/A&P                          MDM: Pt given IV pain medication.  Ct scan shows an ovarian cyst. 3.5 cm on the left.  Pt counseled on results.  Pt treated for low back pain  Final Clinical Impression(s) / ED Diagnoses Final diagnoses:  Acute low back pain without sciatica, unspecified back pain laterality    Rx / DC Orders ED Discharge Orders         Ordered    ibuprofen (ADVIL) 800 MG tablet  Every 8 hours PRN     Discontinue  Reprint     01/01/20 1426    methocarbamol (ROBAXIN) 500 MG tablet  4 times daily     Discontinue  Reprint     01/01/20 1426        An After Visit Summary was printed and given to the patient.    Fransico Meadow, PA-C 01/01/20 [place], [place], DO 01/01/20 1430

## 2020-01-01 NOTE — ED Notes (Signed)
Pt given water 

## 2020-01-01 NOTE — Discharge Instructions (Addendum)
Follow up with your Physician for recheck for recheck

## 2020-01-01 NOTE — ED Triage Notes (Signed)
Pt reports lower back pain that radiates to her mid back for the past month. States she saw her obgyn for this, thought it may be a UTI or kidney stones but UA culture was negative for infection and xray of back was normal, sent here for CT scan. Pt having nausea but no vomiting. No changes in bowels. LMP 5/29

## 2020-01-01 NOTE — ED Notes (Signed)
Pt ambulated to the br with a steady gait. 

## 2020-01-01 NOTE — ED Notes (Signed)
Pt to CT. IV not flushing.  2 attempts with no success.  IV team requested.  Pt states dilaudid made her tired, but did not take away the pain.

## 2020-01-05 ENCOUNTER — Other Ambulatory Visit: Payer: Self-pay

## 2020-01-05 ENCOUNTER — Ambulatory Visit (INDEPENDENT_AMBULATORY_CARE_PROVIDER_SITE_OTHER): Payer: 59 | Admitting: Obstetrics

## 2020-01-05 ENCOUNTER — Encounter: Payer: Self-pay | Admitting: Obstetrics

## 2020-01-05 VITALS — BP 128/72 | HR 70 | Ht 59.0 in | Wt 225.2 lb

## 2020-01-05 DIAGNOSIS — G4700 Insomnia, unspecified: Secondary | ICD-10-CM

## 2020-01-05 DIAGNOSIS — M549 Dorsalgia, unspecified: Secondary | ICD-10-CM | POA: Diagnosis not present

## 2020-01-05 DIAGNOSIS — Z3046 Encounter for surveillance of implantable subdermal contraceptive: Secondary | ICD-10-CM | POA: Diagnosis not present

## 2020-01-05 DIAGNOSIS — M21161 Varus deformity, not elsewhere classified, right knee: Secondary | ICD-10-CM

## 2020-01-05 DIAGNOSIS — R319 Hematuria, unspecified: Secondary | ICD-10-CM | POA: Diagnosis not present

## 2020-01-05 DIAGNOSIS — G4709 Other insomnia: Secondary | ICD-10-CM

## 2020-01-05 DIAGNOSIS — Z8639 Personal history of other endocrine, nutritional and metabolic disease: Secondary | ICD-10-CM

## 2020-01-05 DIAGNOSIS — M21162 Varus deformity, not elsewhere classified, left knee: Secondary | ICD-10-CM

## 2020-01-05 LAB — POCT URINE PREGNANCY: Preg Test, Ur: NEGATIVE

## 2020-01-05 MED ORDER — OXYCODONE-ACETAMINOPHEN 5-325 MG PO TABS: 1.0000 | ORAL_TABLET | ORAL | 0 refills | Status: DC | PRN

## 2020-01-05 MED ORDER — ZOLPIDEM TARTRATE 5 MG PO TABS: 5.0000 mg | ORAL_TABLET | Freq: Every evening | ORAL | 1 refills | Status: AC | PRN

## 2020-01-05 NOTE — Progress Notes (Signed)
Patient ID: Julia Fowler, female   DOB: 12/04/1985, 34 y.o.   MRN: [id number]  Chief Complaint  Patient presents with  . Follow-up    HPI Julia Fowler is a 34 y.o. female.  Complains of backache.  Has a history of hematuria over the past year, but all urine cultures have been negative, and a recent CT of the abdomen and pelvis is negative. HPI  Past Medical History:  Diagnosis Date  . Hypothyroidism    no medication  . Rickets   . Seasonal allergies   . Thyroid disease hypothyroid    Past Surgical History:  Procedure Laterality Date  . ARTHROSCOPY KNEE W/ DRILLING Right 2018  . CESAREAN SECTION    . CESAREAN SECTION N/A 01/21/2014   Procedure: REPEAT CESAREAN SECTION;  Surgeon: Brock Bad, MD;  Location: [facility];  Service: Obstetrics;  Laterality: N/A;  . CESAREAN SECTION MULTI-GESTATIONAL N/A 11/23/2015   Procedure: CESAREAN SECTION MULTI-GESTATIONAL/TWINS;  Surgeon: Brock Bad, MD;  Location: [facility] BIRTHING SUITES;  Service: Obstetrics;  Laterality: N/A;  . TOOTH EXTRACTION Left     Family History  Problem Relation Age of Onset  . Cancer Sister        breast  . Diabetes Maternal Grandmother     Social History Social History   Tobacco Use  . Smoking status: Never Smoker  . Smokeless tobacco: Never Used  Vaping Use  . Vaping Use: Never used  Substance Use Topics  . Alcohol use: No  . Drug use: No    Allergies  Allergen Reactions  . Other Other (See Comments) and Cough    Pt states that she is allergic to cats/dogs.   Reaction:  Itchy, watery eyes     Current Outpatient Medications  Medication Sig Dispense Refill  . etonogestrel (NEXPLANON) 68 MG IMPL implant 1 each by Subdermal route once.    Marland Kitchen ibuprofen (ADVIL) 800 MG tablet Take 1 tablet (800 mg total) by mouth every 8 (eight) hours as needed. 30 tablet 0  . levothyroxine (SYNTHROID) 75 MCG tablet Take 75 mcg by mouth daily.    Marland Kitchen loratadine (CLARITIN) 10 MG tablet TAKE 1 TABLET BY MOUTH EVERY DAY  30 tablet 11  . methocarbamol (ROBAXIN) 500 MG tablet Take 1 tablet (500 mg total) by mouth 4 (four) times daily. 20 tablet 0  . ondansetron (ZOFRAN) 4 MG tablet Take 1 tablet (4 mg total) by mouth every 8 (eight) hours as needed for nausea or vomiting. 30 tablet 2  . oxyCODONE-acetaminophen (PERCOCET/ROXICET) 5-325 MG tablet Take 1 tablet by mouth every 4 (four) hours as needed for severe pain. 20 tablet 0  . zolpidem (AMBIEN) 5 MG tablet Take 1 tablet (5 mg total) by mouth at bedtime as needed for sleep. 15 tablet 1   No current facility-administered medications for this visit.    Review of Systems Review of Systems Constitutional: negative for fatigue and weight loss Respiratory: negative for cough and wheezing Cardiovascular: negative for chest pain, fatigue and palpitations Gastrointestinal: negative for abdominal pain and change in bowel habits Genitourinary:positive for hematuria Integument/breast: negative for nipple discharge Musculoskeletal:positive for myalgias - backache.  Has bowed legs from Rickets as a child Neurological: negative for gait problems and tremors Behavioral/Psych: negative for abusive relationship, depression Endocrine: negative for temperature intolerance      Blood pressure 128/72, pulse 70, height 4\' 11"  (1.499 m), weight 225 lb 3.2 oz (102.2 kg), last menstrual period 01/04/2020.  Physical Exam Physical Exam General:  alert and no distress  Skin:   no rash or abnormalities  Lungs:   clear to auscultation bilaterally  Heart:   regular rate and rhythm, S1, S2 normal, no murmur, click, rub or gallop              Back: Negative flank tenderness to percussion   50% of 20 min visit spent on counseling and coordination of care.   Data Reviewed Urinalysis UPT  Assessment     1. Hematuria, unspecified type Rx: - Ambulatory referral to Urology  2. Backache symptom Rx: - oxyCODONE-acetaminophen (PERCOCET/ROXICET) 5-325 MG tablet; Take 1 tablet by  mouth every 4 (four) hours as needed for severe pain.  Dispense: 20 tablet; Refill: 0 - Ambulatory referral to Orthopedics  3. Secondary insomnia Rx: - zolpidem (AMBIEN) 5 MG tablet; Take 1 tablet (5 mg total) by mouth at bedtime as needed for sleep.  Dispense: 15 tablet; Refill: 1  4. H/O rickets  5. 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 Ambulatory referral to Urology    Referral Priority:   Routine    Referral Type:   Consultation    Referral Reason:   Specialty Services Required    Requested Specialty:   Urology    Number of Visits Requested:   1  . Ambulatory referral to Orthopedics    Referral Priority:   Routine    Referral Type:   Consultation    Number of Visits Requested:   1  . POCT urine pregnancy   Meds ordered this encounter  Medications  . zolpidem (AMBIEN) 5 MG tablet    Sig: Take 1 tablet (5 mg total) by mouth at bedtime as needed for sleep.    Dispense:  15 tablet    Refill:  1  . oxyCODONE-acetaminophen (PERCOCET/ROXICET) 5-325 MG tablet    Sig: Take 1 tablet by mouth every 4 (four) hours as needed for severe pain.    Dispense:  20 tablet    Refill:  0     Brock Bad, MD 01/05/2020 3:36 PM

## 2020-01-05 NOTE — Progress Notes (Signed)
Patient presents for follow up to discuss CT scan results.

## 2020-01-14 ENCOUNTER — Ambulatory Visit (INDEPENDENT_AMBULATORY_CARE_PROVIDER_SITE_OTHER): Payer: 59 | Admitting: Obstetrics

## 2020-01-14 ENCOUNTER — Other Ambulatory Visit: Payer: Self-pay

## 2020-01-14 ENCOUNTER — Encounter: Payer: Self-pay | Admitting: Obstetrics

## 2020-01-14 VITALS — BP 143/97 | HR 78 | Wt 224.8 lb

## 2020-01-14 DIAGNOSIS — Z3046 Encounter for surveillance of implantable subdermal contraceptive: Secondary | ICD-10-CM

## 2020-01-14 MED ORDER — ETONOGESTREL 68 MG ~~LOC~~ IMPL
68.0000 mg | DRUG_IMPLANT | Freq: Once | SUBCUTANEOUS | Status: AC
  Administered 2020-01-14: 68 mg via SUBCUTANEOUS

## 2020-01-14 NOTE — Addendum Note (Signed)
Addended by: Hamilton Capri on: 01/14/2020 02:47 PM   Modules accepted: Orders

## 2020-01-14 NOTE — Progress Notes (Addendum)
Nexplanon Procedure Note    PROCEDURE: Nexplanon Removal and Reinsertion Performing Provider: Brock Bad, MD  Patient education prior to procedure, explained risk, benefits of Nexplanon, reviewed alternative options. Patient reported understanding. Gave consent to continue with procedure.  Patient had Nexplanon inserted in 2017. Desires removal today w/ reinsertion  PROCEDURE:  Pregnancy Text :  UPT- NEGATIVE Site (check):      left arm         Sterile Preparation:   Betadinex3 Lot # [id number] Expiration Date 2023 SEP 26    The patient's left arm was palpated and the implant device located. The area was prepped with Betadinex3. The distal end of the device was palpated and 1.5 cc of 1% lidocaine without epinephrine was injected. A 1.5 mm incision was made. Any fibrotic tissue was carefully dissected away using blunt and/or sharp dissection. The device was removed in an intact manner.   Insertion site was the same as the removal site. Nexplanon  was inserted subcutaneously.Needle was removed from the insertion site. Nexplanon capsule was palpated by provider and patient to assure satisfactory placement.  A single suture of 4-0 Vicryl placed for closure of incision.  Dressing applied.  Followup: The patient tolerated the procedure well without complications.  Standard post-procedure care is explained and return precautions are given.  Brock Bad, MD 01/14/2020 2:16 PM

## 2020-01-14 NOTE — Progress Notes (Signed)
Patient presents for Nexplanon Removal and Reinsertion.

## 2020-03-09 ENCOUNTER — Other Ambulatory Visit: Payer: Self-pay | Admitting: Obstetrics

## 2020-03-09 DIAGNOSIS — N939 Abnormal uterine and vaginal bleeding, unspecified: Secondary | ICD-10-CM

## 2020-03-09 MED ORDER — ORTHO-NOVUM 1/35 (28) 1-35 MG-MCG PO TABS: 1.0000 | ORAL_TABLET | Freq: Every day | ORAL | 11 refills | Status: DC

## 2020-03-16 ENCOUNTER — Other Ambulatory Visit: Payer: Self-pay

## 2020-03-16 ENCOUNTER — Ambulatory Visit (HOSPITAL_COMMUNITY)
Admission: EM | Admit: 2020-03-16 | Discharge: 2020-03-16 | Disposition: A | Discharge status: Home / Self Care | Payer: 59

## 2020-05-04 ENCOUNTER — Ambulatory Visit: Payer: 59

## 2020-05-09 ENCOUNTER — Ambulatory Visit (HOSPITAL_COMMUNITY): Admission: EM | Admit: 2020-05-09 | Discharge: 2020-05-09 | Discharge status: Against Medical Advice | Payer: 59

## 2020-05-09 ENCOUNTER — Other Ambulatory Visit: Payer: Self-pay

## 2020-05-11 ENCOUNTER — Other Ambulatory Visit: Payer: Self-pay

## 2020-05-11 ENCOUNTER — Ambulatory Visit (INDEPENDENT_AMBULATORY_CARE_PROVIDER_SITE_OTHER): Payer: 59

## 2020-05-11 DIAGNOSIS — R300 Dysuria: Secondary | ICD-10-CM | POA: Diagnosis not present

## 2020-05-11 LAB — POCT URINALYSIS DIPSTICK
Blood, UA: POSITIVE
Glucose, UA: NEGATIVE
Leukocytes, UA: NEGATIVE
Protein, UA: NEGATIVE
Spec Grav, UA: 1.01 (ref 1.010–1.025)
pH, UA: 5 (ref 5.0–8.0)

## 2020-05-11 NOTE — Progress Notes (Signed)
SUBJECTIVE: Julia Fowler is a 34 y.o. female who complains of urinary frequency, urgency and dysuria for several days, without flank pain, fever, chills, or abnormal vaginal discharge or bleeding.   OBJECTIVE: Appears well, in no apparent distress. Vital signs not done at visit.Marland Kitchen Urine dipstick shows: large amount of blood however patient does not want to send for culture.   ASSESSMENT: Dysuria   PLAN: Patient does not want urine to be sent for culture.

## 2020-05-16 ENCOUNTER — Other Ambulatory Visit: Payer: 59

## 2020-05-16 DIAGNOSIS — Z20822 Contact with and (suspected) exposure to covid-19: Secondary | ICD-10-CM

## 2020-05-17 LAB — SARS-COV-2, NAA 2 DAY TAT

## 2020-05-17 LAB — NOVEL CORONAVIRUS, NAA: SARS-CoV-2, NAA: NOT DETECTED

## 2020-06-08 ENCOUNTER — Other Ambulatory Visit (INDEPENDENT_AMBULATORY_CARE_PROVIDER_SITE_OTHER): Payer: Self-pay | Admitting: Pediatric Genetics

## 2020-06-08 DIAGNOSIS — Z8489 Family history of other specified conditions: Secondary | ICD-10-CM

## 2020-07-13 ENCOUNTER — Ambulatory Visit (INDEPENDENT_AMBULATORY_CARE_PROVIDER_SITE_OTHER): Payer: 59 | Admitting: Obstetrics

## 2020-07-13 ENCOUNTER — Other Ambulatory Visit: Payer: Self-pay

## 2020-07-13 ENCOUNTER — Other Ambulatory Visit (HOSPITAL_COMMUNITY)
Admission: RE | Admit: 2020-07-13 | Discharge: 2020-07-13 | Disposition: A | Discharge status: Home / Self Care | Payer: 59 | Source: Ambulatory Visit | Attending: Obstetrics | Admitting: Obstetrics

## 2020-07-13 ENCOUNTER — Encounter: Payer: Self-pay | Admitting: Obstetrics

## 2020-07-13 VITALS — BP 113/78 | HR 78 | Wt 229.1 lb

## 2020-07-13 DIAGNOSIS — N939 Abnormal uterine and vaginal bleeding, unspecified: Secondary | ICD-10-CM | POA: Diagnosis not present

## 2020-07-13 DIAGNOSIS — N760 Acute vaginitis: Secondary | ICD-10-CM | POA: Diagnosis not present

## 2020-07-13 DIAGNOSIS — N898 Other specified noninflammatory disorders of vagina: Secondary | ICD-10-CM | POA: Insufficient documentation

## 2020-07-13 DIAGNOSIS — R300 Dysuria: Secondary | ICD-10-CM | POA: Diagnosis not present

## 2020-07-13 DIAGNOSIS — B9689 Other specified bacterial agents as the cause of diseases classified elsewhere: Secondary | ICD-10-CM

## 2020-07-13 DIAGNOSIS — N946 Dysmenorrhea, unspecified: Secondary | ICD-10-CM | POA: Diagnosis not present

## 2020-07-13 DIAGNOSIS — Z3046 Encounter for surveillance of implantable subdermal contraceptive: Secondary | ICD-10-CM

## 2020-07-13 LAB — POCT URINALYSIS DIPSTICK
Bilirubin, UA: NEGATIVE
Blood, UA: NEGATIVE
Glucose, UA: NEGATIVE
Ketones, UA: NEGATIVE
Nitrite, UA: NEGATIVE
Protein, UA: NEGATIVE
Spec Grav, UA: 1.01 (ref 1.010–1.025)
Urobilinogen, UA: 0.2 E.U./dL
pH, UA: 7 (ref 5.0–8.0)

## 2020-07-13 MED ORDER — NORETHINDRONE ACETATE 5 MG PO TABS: 5.0000 mg | ORAL_TABLET | Freq: Every day | ORAL | 0 refills | Status: DC

## 2020-07-13 NOTE — Progress Notes (Signed)
Pt c/o menorrhagia and dysmenorrhea. Pt also c/o malodorous urine and dysuria x 1-2 weeks Pt requests all STD testing  Normal pap on 07/28/19

## 2020-07-13 NOTE — Progress Notes (Signed)
Patient ID: Julia Fowler, female   DOB: 12/08/1985, 35 y.o.   MRN: [id number]  No chief complaint on file.   HPI Julia Fowler is a 35 y.o. female.  Irregular vaginal bleeding with Nexplanon in place.  Also has pain with urination and frequency. HPI  Past Medical History:  Diagnosis Date  . Hypothyroidism    no medication  . Rickets   . Seasonal allergies   . Thyroid disease hypothyroid    Past Surgical History:  Procedure Laterality Date  . ARTHROSCOPY KNEE W/ DRILLING Right 2018  . CESAREAN SECTION    . CESAREAN SECTION N/A 01/21/2014   Procedure: REPEAT CESAREAN SECTION;  Surgeon: Brock Bad, MD;  Location: [facility];  Service: Obstetrics;  Laterality: N/A;  . CESAREAN SECTION MULTI-GESTATIONAL N/A 11/23/2015   Procedure: CESAREAN SECTION MULTI-GESTATIONAL/TWINS;  Surgeon: Brock Bad, MD;  Location: [facility] BIRTHING SU[facility];  Service: Obstetrics;  Laterality: N/A;  . TOOTH EXTRACTION Left     Family History  Problem Relation Age of Onset  . Cancer Sister        breast  . Diabetes Maternal Grandmother     Social History Social History   Tobacco Use  . Smoking status: Never Smoker  . Smokeless tobacco: Never Used  Vaping Use  . Vaping Use: Never used  Substance Use Topics  . Alcohol use: No  . Drug use: No    Allergies  Allergen Reactions  . Other Other (See Comments) and Cough    Pt states that she is allergic to cats/dogs.   Reaction:  Itchy, watery eyes     Current Outpatient Medications  Medication Sig Dispense Refill  . etonogestrel (NEXPLANON) 68 MG IMPL implant 1 each by Subdermal route once.    Marland Kitchen ibuprofen (ADVIL) 800 MG tablet Take 1 tablet (800 mg total) by mouth every 8 (eight) hours as needed. 30 tablet 0  . levothyroxine (SYNTHROID) 75 MCG tablet Take 75 mcg by mouth daily.    . norethindrone (AYGESTIN) 5 MG tablet Take 1 tablet (5 mg total) by mouth daily. 30 tablet 0  . traMADol (ULTRAM) 50 MG tablet Take 50 mg by mouth every 6 (six) hours  as needed.    Marland Kitchen oxyCODONE-acetaminophen (PERCOCET/ROXICET) 5-325 MG tablet Take 1 tablet by mouth every 4 (four) hours as needed for severe pain. 20 tablet 0  . zolpidem (AMBIEN) 5 MG tablet Take 1 tablet (5 mg total) by mouth at bedtime as needed for sleep. (Patient not taking: Reported on 07/13/2020) 15 tablet 1   No current facility-administered medications for this visit.    Review of Systems Review of Systems Constitutional: negative for fatigue and weight loss Respiratory: negative for cough and wheezing Cardiovascular: negative for chest pain, fatigue and palpitations Gastrointestinal: negative for abdominal pain and change in bowel habits Genitourinary: irregular vaginal bleeding.  Pain with urination and frequency Integument/breast: negative for nipple discharge Musculoskeletal:negative for myalgias Neurological: negative for gait problems and tremors Behavioral/Psych: negative for abusive relationship, depression Endocrine: negative for temperature intolerance      Blood pressure 113/78, pulse 78, weight 229 lb 1.6 oz (103.9 kg), last menstrual period 06/23/2020.  Physical Exam Physical Exam           General:  Alert and no distress Abdomen:  normal findings: no organomegaly, soft, non-tender and no hernia  Pelvis:  External genitalia: normal general appearance Urinary system: urethral meatus normal and bladder without fullness, nontender Vaginal: normal without tenderness, induration or masses Cervix:  normal appearance Adnexa: normal bimanual exam Uterus: anteverted and non-tender, normal size    50% of 15 min visit spent on counseling and coordination of care.   Data Reviewed Labs  Assessment     1. Encounter for surveillance of Nexplanon subdermal contraceptive - not pleased with AUB.  Have tried to OCP's without success  2. Abnormal uterine bleeding (AUB) Rx: - norethindrone (AYGESTIN) 5 MG tablet; Take 1 tablet (5 mg total) by mouth daily.  Dispense: 30  tablet; Refill: 0 - if bleeding continues will have to remove Nexplanon, and patient would like to try the patch for contraception  3. Dysmenorrhea - taking Ultram - Ibuprofen recommended along with Ultram.  She has 800 mg Ibuprofens on hand  4. Vaginal discharge Rx: - Cervicovaginal ancillary only( [place])  5. Dysuria Rx: - Urine Culture - POCT Urinalysis Dipstick   Plan   Follow up in 3 months  She wants the patch if still having irregular bleeding  Orders Placed This Encounter  Procedures  . Urine Culture  . POCT Urinalysis Dipstick    Brock Bad, MD 07/13/2020 2:03 PM

## 2020-07-14 ENCOUNTER — Other Ambulatory Visit: Payer: Self-pay | Admitting: Obstetrics

## 2020-07-14 DIAGNOSIS — N760 Acute vaginitis: Secondary | ICD-10-CM

## 2020-07-14 DIAGNOSIS — B9689 Other specified bacterial agents as the cause of diseases classified elsewhere: Secondary | ICD-10-CM

## 2020-07-14 LAB — CERVICOVAGINAL ANCILLARY ONLY
Bacterial Vaginitis (gardnerella): POSITIVE — AB
Candida Glabrata: NEGATIVE
Candida Vaginitis: NEGATIVE
Chlamydia: NEGATIVE
Comment: NEGATIVE
Comment: NEGATIVE
Comment: NEGATIVE
Comment: NEGATIVE
Comment: NEGATIVE
Comment: NORMAL
Neisseria Gonorrhea: NEGATIVE
Trichomonas: NEGATIVE

## 2020-07-14 MED ORDER — METRONIDAZOLE 500 MG PO TABS: 500.0000 mg | ORAL_TABLET | Freq: Two times a day (BID) | ORAL | 2 refills | Status: DC

## 2020-07-15 LAB — URINE CULTURE

## 2020-08-02 ENCOUNTER — Encounter: Payer: Self-pay | Admitting: Genetic Counselor

## 2020-08-02 ENCOUNTER — Telehealth: Payer: Self-pay | Admitting: Genetic Counselor

## 2020-08-02 NOTE — Telephone Encounter (Signed)
Spoke with Ms. Person regarding result of genetic testing. She has a clinical diagnosis of hypophosphatemic rickets and her daughter was recently found to have a pathogenic variant in PHEX (c.1368G>C (p.[id number])) consistent with X-linked hypophosphatemia. Ms. Clendenning was tested for this variant and was POSITIVE.  Hereditary hypophosphatemic rickets is caused by genetic mutations resulting in low levels of phosphate. Phosphate is essential for normal formation of bones and teeth. Symptoms frequently begin in childhood, and may vary between individuals, even amongst those of the same family. The range of features are the same in males and females. Symptoms may include slow growth and short stature, bowing of the legs, premature fusion of skull bones (craniosynostosis), and dental abnormalities. Other bone abnormalities may occur, such as calcifications of the tendons, ligaments, and joint capsules. These bone abnormalities may result in pain and impaired mobility. Sensorineural hearing loss has occasionally been reported. As individuals get older, they often experience a softening of the bones. Some individuals may only experience low levels of phosphate but are otherwise asymptomatic.   There are various genes associated with hereditary hypophosphatemic rickets. The most common is PHEX. This gene produces an enzyme that is mainly active in the bones and teeth. It is located on the X chromosome and thus mutations in this gene are associated with X linked dominant inheritance. Females typically have two X chromosomes, and males typically have an X and a Y chromosome. In X-linked dominant conditions, a single mutation in the gene is required to cause mutations. Males and females can both be affected by X-linked hypophosphatemic rickets. Females who have PHEX mutations have a 50% chance of passing the mutation on to each of their children. Males who have PHEX mutations will pass the mutation on to all of their  daughters, but they will not pass it on to their sons (as they pass on a Y chromosome rather than the X chromosome to their sons).   Per [employer], it is recommended that individuals undergo the below evaluations following initial diagnosis:  Children ? Lower extremity x-ray ? Wrist x-ray ? Bone age measurement ? Craniofacial examination for signs of craniosynostosis ? Dental examination ? Hearing evaluation  Adults ? X-ray of skeletal sites with reported pain to assess for enthesopathy or stress fractures ? Dental examination ? Hearing evaluation  All ages ? Evaluation of those with headache and vertigo for Chiari malformation ? Consultation with clinical geneticist/genetic counselor  It is recommended that all individuals undergo periodic clinical evaluation to assess disease progression, treatment response and therapeutic complications. Additionally, individuals should avoid treatment with unopposed phosphate, as well as 1,25(OH)2 vitamin D as a single agent.  Treatment of symptoms in children typically includes use of oral phosphate administered 3-5 times daily and high-dose calcitriol, though surgical management is occasionally needed. Possible complications of treatment include hyperparathyroidism, hypercalciuria, and nephrocalciniosis. As such, to monitor treatment success and assess for complications, those on calcitriol and phosphate therapy should undergo the following:  Quarterly monitoring of the following: serum concentrations of phosphate, calcium, and creatinine; alkaline phosphatase level; intact parathyroid hormone level; and urinary calcium, phosphate, and creatinine to identify and thus prevent therapeutic complications  Intermittent monitoring of lower-extremity x-rays (teleoroentgenograms) to assess skeletal response to treatment. The frequency has not been well established; although annual imaging can be considered, the decision for imaging should be based on  symptoms and physical examination findings.  Renal ultrasound examination to assess for nephrocalcinosis. The frequency has not been well established.  Dental follow up twice a  year (as for children and teenagers at high risk for caries) Treatment in adults typically occurs in those with skeletal pain, upcoming orthopedic surgery, osteomalacia, or recurrent pseudofractures or stress fractures.   Ms. Blecha daughter recently began treatment with a new medication called Crysvita, a specialized medication for those with PHEX variants. The PHEX change causes increased FGF23 activity, through an unknown mechanism, which then suppresses renal tubular phosphate reabsorption and renal production of 1,25 dihydroxy vitamin D. Crysvita works by binding to and inhibiting the biological activity of FGF23, restoring renal phosphate reabsorption and increasing the serum concentration of 1,25 dihydroxy vitamin D. Ms. Obarr may talk with her doctor about whether this treatment would be appropriate for her.  A copy of Ms. Sherwood's result will be uploaded into Epic and has been shared with her through the [employer] portal.  Charline Bills, [facility]

## 2020-08-03 ENCOUNTER — Encounter (INDEPENDENT_AMBULATORY_CARE_PROVIDER_SITE_OTHER): Payer: Self-pay

## 2020-08-03 NOTE — Telephone Encounter (Signed)
I routed you the letter for so you can review and edit if needed

## 2020-08-16 ENCOUNTER — Other Ambulatory Visit: Payer: Self-pay | Admitting: Obstetrics

## 2020-08-16 DIAGNOSIS — N939 Abnormal uterine and vaginal bleeding, unspecified: Secondary | ICD-10-CM

## 2020-09-16 ENCOUNTER — Other Ambulatory Visit: Payer: Self-pay

## 2020-09-16 DIAGNOSIS — N898 Other specified noninflammatory disorders of vagina: Secondary | ICD-10-CM

## 2020-09-16 DIAGNOSIS — N939 Abnormal uterine and vaginal bleeding, unspecified: Secondary | ICD-10-CM

## 2020-09-16 MED ORDER — FLUCONAZOLE 150 MG PO TABS: 150.0000 mg | ORAL_TABLET | Freq: Once | ORAL | 0 refills | Status: AC

## 2020-09-16 MED ORDER — NORETHINDRONE ACETATE 5 MG PO TABS: 5.0000 mg | ORAL_TABLET | Freq: Every day | ORAL | 11 refills | Status: DC

## 2020-09-16 NOTE — Progress Notes (Signed)
Pt c/o yeast infection and requests rf on BCP. Rx sent per protocol

## 2020-09-27 ENCOUNTER — Ambulatory Visit (INDEPENDENT_AMBULATORY_CARE_PROVIDER_SITE_OTHER): Payer: 59 | Admitting: "Endocrinology

## 2020-09-28 ENCOUNTER — Encounter (INDEPENDENT_AMBULATORY_CARE_PROVIDER_SITE_OTHER): Payer: Self-pay | Admitting: "Endocrinology

## 2020-09-28 ENCOUNTER — Ambulatory Visit (INDEPENDENT_AMBULATORY_CARE_PROVIDER_SITE_OTHER): Payer: 59 | Admitting: "Endocrinology

## 2020-09-28 ENCOUNTER — Other Ambulatory Visit: Payer: Self-pay

## 2020-09-28 VITALS — BP 118/72 | HR 74 | Wt 231.4 lb

## 2020-09-28 DIAGNOSIS — M549 Dorsalgia, unspecified: Secondary | ICD-10-CM | POA: Insufficient documentation

## 2020-09-28 DIAGNOSIS — E063 Autoimmune thyroiditis: Secondary | ICD-10-CM

## 2020-09-28 DIAGNOSIS — M5440 Lumbago with sciatica, unspecified side: Secondary | ICD-10-CM | POA: Diagnosis not present

## 2020-09-28 DIAGNOSIS — Z96651 Presence of right artificial knee joint: Secondary | ICD-10-CM

## 2020-09-28 DIAGNOSIS — M9080 Osteopathy in diseases classified elsewhere, unspecified site: Secondary | ICD-10-CM

## 2020-09-28 DIAGNOSIS — M25561 Pain in right knee: Secondary | ICD-10-CM

## 2020-09-28 DIAGNOSIS — G8929 Other chronic pain: Secondary | ICD-10-CM

## 2020-09-28 LAB — PHOSPHORUS: Phosphorus: 2.1 mg/dL — ABNORMAL LOW (ref 2.5–4.5)

## 2020-09-28 NOTE — Progress Notes (Signed)
Subjective:  Patient Name: Julia Fowler Date of Birth: 1986-04-12  MRN: [id number]  Julia Fowler  presents to the office today, in referral from [facility], for initial endocrine consultation for the chief complaint of X-linked hypophosphatemic rickets.  HISTORY OF PRESENT ILLNESS:   Julia Fowler is a 35 y.o. african-American woman.Julia Fowler was accompanied by her son   1. Julia Fowler had her initial endocrine consultation on 09/28/20:  A. XLHR:   1). 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 She was treated with phosphorus, calcitriol, and vitamin D.   2)aortic stenosis an adult she stopped taking the above medications but has taken a MVI.    3). She continues to have pains in her knees and back, and sometimes in her elbows and hands.    4). She has had several fractures including her right ankle and both knees. She also tore the meniscus of her right knee and had arthroscopy.     5). Her daughter is on Crysvita treatment now and Julia Fowler wants to try it.    B. Pertinent past medical history:   1). Medical problems:    A). Acquired hypothyroidism: She never had thyroid surgery, radioactive iodine , or went on a prolonged low-iodine diet.     B). Obesity   2). Surgeries: 3 cesarean sections, arthroscopy right knee   3). Allergies: No known medication allergies   4). GYN:G3,P3; She now has a Nexplanon implant.   5). Medications: Levothyroxine, 75 mcg/day; Nexplanon implant; Tramadol; Ambien  C. Pertinent family history   1). XLHR: son, daughter father,and twin sister   2). Thyroid disease: Son has goiter.   3). Obesity: Her mother and maternal aunts   4). DM in paternal grandmother.    5). ASCVD: None   6). Cancer: Older sister has breast cancer   7). None   2. Pertinent Review of Systems:  Constitutional: The patient feels tired all the time. Her body aches a lot. She has difficulty sleeping due to her many pains.  Eyes: Vision is good. There are no significant eye complaints. Neck: The  patient has frequent complaints of anterior neck discomfort/pain, but no difficulty swallowing.  Heart: Heart rate sometimes increases on its own.  The patient has no complaints of palpitations, irregular heat beats, chest pain, or chest pressure. Gastrointestinal: She has diarrhea frequently. Bowel movents seem normal. The patient has no complaints of excessive hunger, acid reflux, upset stomach, stomach aches or pains, or constipation. Hands and arms: Arms are frequently weak and she has frequent pains in her elbows.  Legs: Right knee hurts more than the left.  Ankles are often sore. She has a sensation of numbness of her entire right leg. Muscle mass and strength seem normal. There are no other complaints of numbness, tingling, burning, or pain. No edema is noted. Feet: There are no obvious foot problems. There are no complaints of numbness, tingling, burning, or pain. No edema is noted. GYN: Nexplanon implant is in place.    PAST MEDICAL, FAMILY, AND SOCIAL HISTORY:  Past Medical History:  Diagnosis Date  . Hypothyroidism    no medication  . Rickets   . Seasonal allergies   . Thyroid disease hypothyroid    Family History  Problem Relation Age of Onset  . Cancer Sister        breast  . Diabetes Maternal Grandmother      Current Outpatient Medications:  .  levothyroxine (SYNTHROID) 75 MCG tablet, Take 75 mcg by mouth daily., Disp: ,  Rfl:  .  etonogestrel (NEXPLANON) 68 MG IMPL implant, 1 each by Subdermal route once., Disp: , Rfl:  .  ibuprofen (ADVIL) 800 MG tablet, Take 1 tablet (800 mg total) by mouth every 8 (eight) hours as needed., Disp: 30 tablet, Rfl: 0 .  metroNIDAZOLE (FLAGYL) 500 MG tablet, Take 1 tablet (500 mg total) by mouth 2 (two) times daily., Disp: 14 tablet, Rfl: 2 .  norethindrone (AYGESTIN) 5 MG tablet, Take 1 tablet (5 mg total) by mouth daily., Disp: 30 tablet, Rfl: 11 .  oxyCODONE-acetaminophen (PERCOCET/ROXICET) 5-325 MG tablet, Take 1 tablet by mouth every  4 (four) hours as needed for severe pain., Disp: 20 tablet, Rfl: 0 .  traMADol (ULTRAM) 50 MG tablet, Take 50 mg by mouth every 6 (six) hours as needed., Disp: , Rfl:  .  zolpidem (AMBIEN) 5 MG tablet, Take 1 tablet (5 mg total) by mouth at bedtime as needed for sleep. (Patient not taking: Reported on 07/13/2020), Disp: 15 tablet, Rfl: 1  Allergies as of 09/28/2020 - Review Complete 09/28/2020  Allergen Reaction Noted  . 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 .  2. Activities: Work and family 3. Smoking, alcohol, or drugs: Occasional alcohol, no tobacco, no recreational drugs 4. Primary Care Provider: [facility]  REVIEW OF SYSTEMS: There are no other significant problems involving Julia Fowler other body systems.   Objective:  Vital Signs:  BP 118/72 (BP Location: Right Arm, Patient Position: Sitting, Cuff Size: Large)   Pulse 74   Wt 231 lb 6.4 oz (105 kg)   BMI 46.74 kg/m    Ht Readings from Last 3 Encounters:  01/05/20 4\' 11"  (1.499 m)  01/01/20 4\' 11"  (1.499 m)  12/15/19 4\' 11"  (1.499 m)   Wt Readings from Last 3 Encounters:  09/28/20 231 lb 6.4 oz (105 kg)  07/13/20 229 lb 1.6 oz (103.9 kg)  01/14/20 224 lb 12.8 oz (102 kg)   HC Readings from Last 3 Encounters:  No data found for [facility]   Body surface area is 2.09 meters squared.  PHYSICAL EXAM:  Constitutional: The patient appears healthy, morbidly obese, and antalgic. She can't sit still for very long. She is alert and bright. Her affect and insight are normal.   Face: The face appears normal.  Eyes: There is no obvious arcus or proptosis. Moisture appears normal. Mouth: The oropharynx and tongue appear normal. Oral moisture is normal. Neck: The neck appears to be visibly normal. No carotid bruits are noted. The thyroid gland is mildly enlarged at 21 grams in size. The consistency of the thyroid gland is somewhat full  bilaterally. The thyroid gland is tender to palpation in the right lobe.  Lungs: The lungs are clear to auscultation. Air movement is good. Heart: Heart rate and rhythm are regular. Heart sounds S1 and S2 are normal. I did not appreciate any pathologic cardiac murmurs. Abdomen: The abdomen is quite obese. Bowel sounds are normal. There is no obvious hepatomegaly, splenomegaly, or other mass effect.  Arms: Muscle size and bulk are normal for age. Hands: There is no obvious tremor. Phalangeal and metacarpophalangeal joints are normal. Palmar muscles are normal. Palmar skin is normal. Palmar moisture is also normal. Legs: Muscles appear normal for age. No edema is present. Neurologic: Strength is normal for age in both the upper and lower extremities. Muscle tone is normal. Sensation to touch  is normal in both the legs.    LAB DATA:  No results found for this or any previous visit (from the past 504 hour(s)).   Assessment and Plan:   ASSESSMENT:  1. 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 She still has pains in many parts of her body. She is not under treatment now. She would like to try Crysvita.  2. Hypothyroid, acquired, autoimmune: She presumably has Hashimoto's disease. She has lab tests done recently at [facility]. She is clinically hypothyroid today. 3. Morbid obesity: This problem has large genetic component.  4. 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 5. Right knee pain, chronic: This problem may be linked to there XLHR.  PLAN:  1. Diagnostic: TFT, PTH, calcium, CMP, phosphorus, 25-OH vitamin dd, calcitriol 2. Therapeutic: I will order Crysvita once her phosphorus level returns.  3. Patient education: I showed her a handout on Crysvita, with both its advantages and disadvantages. She wished to proceed with treatment. 4. Follow-up: 3 months  Level of Service: This visit lasted in excess of 60 minutes. More than 50% of the visit was  devoted to counseling.  Molli Knock, MD, CDE Adult and Pediatric Endocrinology

## 2020-09-28 NOTE — Patient Instructions (Signed)
Follow up visit in 3 months. 

## 2020-10-11 ENCOUNTER — Encounter (INDEPENDENT_AMBULATORY_CARE_PROVIDER_SITE_OTHER): Payer: Self-pay | Admitting: *Deleted

## 2020-11-17 ENCOUNTER — Other Ambulatory Visit (HOSPITAL_COMMUNITY)
Admission: RE | Admit: 2020-11-17 | Discharge: 2020-11-17 | Disposition: A | Discharge status: Home / Self Care | Payer: 59 | Source: Ambulatory Visit | Attending: Obstetrics | Admitting: Obstetrics

## 2020-11-17 ENCOUNTER — Encounter: Payer: Self-pay | Admitting: Obstetrics

## 2020-11-17 ENCOUNTER — Ambulatory Visit (INDEPENDENT_AMBULATORY_CARE_PROVIDER_SITE_OTHER): Payer: 59 | Admitting: Obstetrics

## 2020-11-17 ENCOUNTER — Other Ambulatory Visit: Payer: Self-pay

## 2020-11-17 VITALS — BP 133/81 | HR 87

## 2020-11-17 DIAGNOSIS — N898 Other specified noninflammatory disorders of vagina: Secondary | ICD-10-CM | POA: Diagnosis present

## 2020-11-17 DIAGNOSIS — M549 Dorsalgia, unspecified: Secondary | ICD-10-CM

## 2020-11-17 DIAGNOSIS — B3731 Acute candidiasis of vulva and vagina: Secondary | ICD-10-CM

## 2020-11-17 DIAGNOSIS — R300 Dysuria: Secondary | ICD-10-CM | POA: Diagnosis not present

## 2020-11-17 DIAGNOSIS — B373 Candidiasis of vulva and vagina: Secondary | ICD-10-CM

## 2020-11-17 LAB — POCT URINALYSIS DIPSTICK
Bilirubin, UA: NEGATIVE
Glucose, UA: NEGATIVE
Ketones, UA: NEGATIVE
Leukocytes, UA: NEGATIVE
Nitrite, UA: NEGATIVE
Protein, UA: NEGATIVE
Spec Grav, UA: 1.025 (ref 1.010–1.025)
Urobilinogen, UA: 0.2 E.U./dL
pH, UA: 6 (ref 5.0–8.0)

## 2020-11-17 MED ORDER — OXYCODONE-ACETAMINOPHEN 5-325 MG PO TABS: 1.0000 | ORAL_TABLET | ORAL | 0 refills | Status: DC | PRN

## 2020-11-17 MED ORDER — NITROFURANTOIN MONOHYD MACRO 100 MG PO CAPS: 100.0000 mg | ORAL_CAPSULE | Freq: Two times a day (BID) | ORAL | 2 refills | Status: AC

## 2020-11-17 MED ORDER — TERCONAZOLE 0.8 % VA CREA: 1.0000 | TOPICAL_CREAM | Freq: Every day | VAGINAL | 2 refills | Status: DC

## 2020-11-17 NOTE — Progress Notes (Signed)
Pt states urinary symptoms today.

## 2020-11-17 NOTE — Addendum Note (Signed)
Addended by: Brock Bad on: 11/17/2020 04:35 PM   Modules accepted: Orders

## 2020-11-17 NOTE — Progress Notes (Addendum)
Patient ID: Julia Fowler, female   DOB: 1985-11-08, 35 y.o.   MRN: [id number]  Chief Complaint  Patient presents with  . Urinary Tract Infection    HPI Julia Fowler is a 35 y.o. female.  Complains of burning, pain and frequency of urination.  Also c/o vaginal discharge and itching.  Recently treated for Strep Throat.   HPI  Past Medical History:  Diagnosis Date  . Hypothyroidism    no medication  . Rickets   . Seasonal allergies   . Thyroid disease hypothyroid    Past Surgical History:  Procedure Laterality Date  . ARTHROSCOPY KNEE W/ DRILLING Right 2018  . CESAREAN SECTION    . CESAREAN SECTION N/A 01/21/2014   Procedure: REPEAT CESAREAN SECTION;  Surgeon: Brock Bad, MD;  Location: [facility];  Service: Obstetrics;  Laterality: N/A;  . CESAREAN SECTION MULTI-GESTATIONAL N/A 11/23/2015   Procedure: CESAREAN SECTION MULTI-GESTATIONAL/TWINS;  Surgeon: Brock Bad, MD;  Location: [facility] BIRTHING SUITES;  Service: Obstetrics;  Laterality: N/A;  . TOOTH EXTRACTION Left     Family History  Problem Relation Age of Onset  . Cancer Sister        breast  . Diabetes Maternal Grandmother     Social History Social History   Tobacco Use  . Smoking status: Never Smoker  . Smokeless tobacco: Never Used  Vaping Use  . Vaping Use: Never used  Substance Use Topics  . Alcohol use: No  . Drug use: No    Allergies  Allergen Reactions  . Other Other (See Comments) and Cough    Pt states that she is allergic to cats/dogs.   Reaction:  Itchy, watery eyes     Current Outpatient Medications  Medication Sig Dispense Refill  . norethindrone (AYGESTIN) 5 MG tablet Take 1 tablet (5 mg total) by mouth daily. 30 tablet 11  . terconazole (TERAZOL 3) 0.8 % vaginal cream Place 1 applicator vaginally at bedtime. 20 g 2  . etonogestrel (NEXPLANON) 68 MG IMPL implant 1 each by Subdermal route once.    Marland Kitchen ibuprofen (ADVIL) 800 MG tablet Take 1 tablet (800 mg total) by mouth every 8 (eight)  hours as needed. 30 tablet 0  . levothyroxine (SYNTHROID) 75 MCG tablet Take 75 mcg by mouth daily.    . metroNIDAZOLE (FLAGYL) 500 MG tablet Take 1 tablet (500 mg total) by mouth 2 (two) times daily. 14 tablet 2  . oxyCODONE-acetaminophen (PERCOCET/ROXICET) 5-325 MG tablet Take 1 tablet by mouth every 4 (four) hours as needed for severe pain. 20 tablet 0  . traMADol (ULTRAM) 50 MG tablet Take 50 mg by mouth every 6 (six) hours as needed.    . zolpidem (AMBIEN) 5 MG tablet Take 1 tablet (5 mg total) by mouth at bedtime as needed for sleep. (Patient not taking: Reported on 07/13/2020) 15 tablet 1   No current facility-administered medications for this visit.    Review of Systems Review of Systems Constitutional: negative for fatigue and weight loss Respiratory: negative for cough and wheezing Cardiovascular: negative for chest pain, fatigue and palpitations Gastrointestinal: negative for abdominal pain and change in bowel habits Genitourinary: positive for vaginal discharge and itching, and urinary frequency and burning Integument/breast: negative for nipple discharge Musculoskeletal: positive for myalgias - backache Neurological: negative for gait problems and tremors Behavioral/Psych: negative for abusive relationship, depression Endocrine: negative for temperature intolerance      Blood pressure 133/81, pulse 87.  Physical Exam Physical Exam General:   alert  and no distress  Skin:   no rash or abnormalities  Lungs:   clear to auscultation bilaterally  Heart:   regular rate and rhythm, S1, S2 normal, no murmur, click, rub or gallop  Breasts:   normal without suspicious masses, skin or nipple changes or axillary nodes  Abdomen:  normal findings: no organomegaly, soft, non-tender and no hernia  Pelvis:  External genitalia: normal general appearance Urinary system: urethral meatus normal and bladder without fullness, nontender Vaginal: normal without tenderness, induration or  masses Cervix: normal appearance Adnexa: normal bimanual exam Uterus: anteverted and non-tender, normal size    I have spent a total of 15 minutes of face-to-face time, excluding clinical staff time, reviewing notes and preparing to see patient, ordering tests and/or medications, and counseling the patient.   Data Reviewed Urinalysis Wet Prep and Cultures  Assessment       1. Dysuria Rx: - POCT urinalysis dipstick - nitrofurantoin, macrocrystal-monohydrate, (MACROBID) 100 MG capsule; Take 1 capsule (100 mg total) by mouth 2 (two) times daily. 1 po BID x 7days  Dispense: 14 capsule; Refill: 2  2. Vaginal discharge Rx: - Cervicovaginal ancillary only  3. Candida vaginitis Rx: - terconazole (TERAZOL 3) 0.8 % vaginal cream; Place 1 applicator vaginally at bedtime.  Dispense: 20 g; Refill: 2  4. Backache symptom Rx: - oxyCODONE-acetaminophen (PERCOCET/ROXICET) 5-325 MG tablet; Take 1 tablet by mouth every 4 (four) hours as needed for severe pain.  Dispense: 20 tablet; Refill: 0   Plan   Follow up in 3 months for Annual / Pap  Orders Placed This Encounter  Procedures  . POCT urinalysis dipstick   Meds ordered this encounter  Medications  . terconazole (TERAZOL 3) 0.8 % vaginal cream    Sig: Place 1 applicator vaginally at bedtime.    Dispense:  20 g    Refill:  2     Brock Bad, MD 11/17/2020 1:41 PM

## 2020-11-18 LAB — CERVICOVAGINAL ANCILLARY ONLY
Bacterial Vaginitis (gardnerella): NEGATIVE
Candida Glabrata: NEGATIVE
Candida Vaginitis: POSITIVE — AB
Chlamydia: NEGATIVE
Comment: NEGATIVE
Comment: NEGATIVE
Comment: NEGATIVE
Comment: NEGATIVE
Comment: NEGATIVE
Comment: NORMAL
Neisseria Gonorrhea: NEGATIVE
Trichomonas: NEGATIVE

## 2020-11-21 ENCOUNTER — Other Ambulatory Visit: Payer: Self-pay | Admitting: Obstetrics

## 2020-12-08 ENCOUNTER — Other Ambulatory Visit: Payer: Self-pay | Admitting: Obstetrics

## 2020-12-08 DIAGNOSIS — N939 Abnormal uterine and vaginal bleeding, unspecified: Secondary | ICD-10-CM

## 2020-12-21 ENCOUNTER — Ambulatory Visit (INDEPENDENT_AMBULATORY_CARE_PROVIDER_SITE_OTHER): Payer: 59 | Admitting: "Endocrinology

## 2021-02-09 NOTE — Progress Notes (Deleted)
Subjective:  Patient Name: Rikayla Demmon Date of Birth: 24-Dec-1985  MRN: [id number]  Cherita Hebel  presents to the office today, in referral from [facility], for initial endocrine consultation for the chief complaint of X-linked hypophosphatemic rickets.  HISTORY OF PRESENT ILLNESS:   Derrisha is a 35 y.o. african-American woman.[name] was accompanied by her son   1. Ms. Lunden had her initial endocrine consultation on 09/28/20:  A. XLHR:   1). 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 She was treated with phosphorus, calcitriol, and vitamin D.   2)aortic stenosis an adult she stopped taking the above medications but has taken a MVI.    3). She continues to have pains in her knees and back, and sometimes in her elbows and hands.    4). She has had several fractures including her right ankle and both knees. She also tore the meniscus of her right knee and had arthroscopy.     5). Her daughter is on Crysvita treatment now and Ms. Cedotal wants to try it.    B. Pertinent past medical history:   1). Medical problems:    A). Acquired hypothyroidism: She never had thyroid surgery, radioactive iodine , or went on a prolonged low-iodine diet.     B). Obesity   2). Surgeries: 3 cesarean sections, arthroscopy right knee   3). Allergies: No known medication allergies   4). GYN:G3,P3; She now has a Nexplanon implant.   5). Medications: Levothyroxine, 75 mcg/day; Nexplanon implant; Tramadol; Ambien  C. Pertinent family history   1). XLHR: son, daughter father,and twin sister   2). Thyroid disease: Son has goiter.   3). Obesity: Her mother and maternal aunts   4). DM in paternal grandmother.    5). ASCVD: None   6). Cancer: Older sister has breast cancer   7). None   2. Ms. Blea' last Endocrine Clinic visit occurred on 09/28/20. I ordered lab tests for her, but they were never done.   3. Pertinent Review of Systems:  Constitutional: The patient feels tired all the time. Her body aches a lot. She has  difficulty sleeping due to her many pains.  Eyes: Vision is good. There are no significant eye complaints. Neck: The patient has frequent complaints of anterior neck discomfort/pain, but no difficulty swallowing.  Heart: Heart rate sometimes increases on its own.  The patient has no complaints of palpitations, irregular heat beats, chest pain, or chest pressure. Gastrointestinal: She has diarrhea frequently. Bowel movents seem normal. The patient has no complaints of excessive hunger, acid reflux, upset stomach, stomach aches or pains, or constipation. Hands and arms: Arms are frequently weak and she has frequent pains in her elbows.  Legs: Right knee hurts more than the left.  Ankles are often sore. She has a sensation of numbness of her entire right leg. Muscle mass and strength seem normal. There are no other complaints of numbness, tingling, burning, or pain. No edema is noted. Feet: There are no obvious foot problems. There are no complaints of numbness, tingling, burning, or pain. No edema is noted. GYN: Nexplanon implant is in place.    PAST MEDICAL, FAMILY, AND SOCIAL HISTORY:  Past Medical History:  Diagnosis Date   Hypothyroidism    no medication   Rickets    Seasonal allergies    Thyroid disease hypothyroid    Family History  Problem Relation Age of Onset   Cancer Sister        breast   Diabetes Maternal Grandmother  Current Outpatient Medications:    etonogestrel (NEXPLANON) 68 MG IMPL implant, 1 each by Subdermal route once., Disp: , Rfl:    ibuprofen (ADVIL) 800 MG tablet, Take 1 tablet (800 mg total) by mouth every 8 (eight) hours as needed., Disp: 30 tablet, Rfl: 0   levothyroxine (SYNTHROID) 75 MCG tablet, Take 75 mcg by mouth daily., Disp: , Rfl:    metroNIDAZOLE (FLAGYL) 500 MG tablet, Take 1 tablet (500 mg total) by mouth 2 (two) times daily., Disp: 14 tablet, Rfl: 2   nitrofurantoin, macrocrystal-monohydrate, (MACROBID) 100 MG capsule, Take 1 capsule (100 mg  total) by mouth 2 (two) times daily. 1 po BID x 7days, Disp: 14 capsule, Rfl: 2   norethindrone (AYGESTIN) 5 MG tablet, TAKE ONE TABLET EVERY DAY, Disp: 30 tablet, Rfl: 0   oxyCODONE-acetaminophen (PERCOCET/ROXICET) 5-325 MG tablet, Take 1 tablet by mouth every 4 (four) hours as needed for severe pain., Disp: 20 tablet, Rfl: 0   terconazole (TERAZOL 3) 0.8 % vaginal cream, Place 1 applicator vaginally at bedtime., Disp: 20 g, Rfl: 2   zolpidem (AMBIEN) 5 MG tablet, Take 1 tablet (5 mg total) by mouth at bedtime as needed for sleep. 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 .  2. Activities: Work and family 3. Smoking, alcohol, or drugs: Occasional alcohol, no tobacco, no recreational drugs 4. Primary Care Provider: [facility]  REVIEW OF SYSTEMS: There are no other significant problems involving Dessire's other body systems.   Objective:  Vital Signs:  There were no vitals taken for this visit.   Ht Readings from Last 3 Encounters:  01/05/20 4\' 11"  (1.499 m)  01/01/20 4\' 11"  (1.499 m)  12/15/19 4\' 11"  (1.499 m)   Wt Readings from Last 3 Encounters:  09/28/20 231 lb 6.4 oz (105 kg)  07/13/20 229 lb 1.6 oz (103.9 kg)  01/14/20 224 lb 12.8 oz (102 kg)   HC Readings from Last 3 Encounters:  No data found for HC   There is no height or weight on file to calculate BSA.  PHYSICAL EXAM:  Constitutional: The patient appears healthy, morbidly obese, and antalgic. She can't sit still for very long. She is alert and bright. Her affect and insight are normal.   Face: The face appears normal.  Eyes: There is no obvious arcus or proptosis. Moisture appears normal. Mouth: The oropharynx and tongue appear normal. Oral moisture is normal. Neck:  The neck appears to be visibly normal. No carotid bruits are noted. The thyroid gland is mildly enlarged at 21 grams in size. The consistency of the thyroid gland is somewhat full bilaterally. The thyroid gland is tender to palpation in the right lobe.  Lungs: The lungs are clear to auscultation. Air movement is good. Heart: Heart rate and rhythm are regular. Heart sounds S1 and S2 are normal. I did not appreciate any pathologic cardiac murmurs. Abdomen: The abdomen is quite obese. Bowel sounds are normal. There is no obvious hepatomegaly, splenomegaly, or other mass effect.  Arms: Muscle size and bulk are normal for age. Hands: There is no obvious tremor. Phalangeal and metacarpophalangeal joints are normal. Palmar muscles are normal. Palmar skin is normal. Palmar moisture is also normal. Legs: Muscles appear normal for age.  No edema is present. Neurologic: Strength is normal for age in both the upper and lower extremities. Muscle tone is normal. Sensation to touch is normal in both the legs.    LAB DATA:  No results found for this or any previous visit (from the past 504 hour(s)).  Labs 09/28/20: Labs were not done.    Assessment and Plan:   ASSESSMENT:  1. 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 She still has pains in many parts of her body. She is not under treatment now. She would like to try Crysvita.  2. Hypothyroid, acquired, autoimmune: She presumably has Hashimoto's disease. She has lab tests done recently at [facility]. She is clinically hypothyroid today. 3. Morbid obesity: This problem has large genetic component.  4. 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 5. Right knee pain, chronic: This problem may be linked to there XLHR.  PLAN:  1. Diagnostic: TFT, PTH, calcium, CMP, phosphorus, 25-OH vitamin dd, calcitriol 2. Therapeutic: I will order Crysvita once her phosphorus level returns.  3. Patient education: I showed her a  handout on Crysvita, with both its advantages and disadvantages. She wished to proceed with treatment. 4. Follow-up: 3 months  Level of Service: This visit lasted in excess of 60 minutes. More than 50% of the visit was devoted to counseling.  Molli Knock, MD, CDE Adult and Pediatric Endocrinology

## 2021-02-10 ENCOUNTER — Ambulatory Visit (INDEPENDENT_AMBULATORY_CARE_PROVIDER_SITE_OTHER): Payer: 59 | Admitting: "Endocrinology

## 2021-02-23 MED ORDER — IBUPROFEN 800 MG PO TABS: 800.0000 mg | ORAL_TABLET | Freq: Three times a day (TID) | ORAL | 1 refills | Status: DC | PRN

## 2021-03-08 ENCOUNTER — Other Ambulatory Visit: Payer: Self-pay | Admitting: Obstetrics

## 2021-03-08 DIAGNOSIS — B9689 Other specified bacterial agents as the cause of diseases classified elsewhere: Secondary | ICD-10-CM

## 2021-03-08 DIAGNOSIS — N760 Acute vaginitis: Secondary | ICD-10-CM

## 2021-03-08 DIAGNOSIS — B373 Candidiasis of vulva and vagina: Secondary | ICD-10-CM

## 2021-03-08 DIAGNOSIS — B3731 Acute candidiasis of vulva and vagina: Secondary | ICD-10-CM

## 2021-03-08 MED ORDER — TERCONAZOLE 0.8 % VA CREA: 1.0000 | TOPICAL_CREAM | Freq: Every day | VAGINAL | 2 refills | Status: AC

## 2021-03-08 MED ORDER — CLINDAMYCIN HCL 300 MG PO CAPS: 300.0000 mg | ORAL_CAPSULE | Freq: Three times a day (TID) | ORAL | 0 refills | Status: AC

## 2021-03-22 ENCOUNTER — Ambulatory Visit: Payer: 59

## 2021-03-22 ENCOUNTER — Other Ambulatory Visit (HOSPITAL_COMMUNITY)
Admission: RE | Admit: 2021-03-22 | Discharge: 2021-03-22 | Disposition: A | Discharge status: Home / Self Care | Payer: 59 | Source: Ambulatory Visit | Attending: Obstetrics and Gynecology | Admitting: Obstetrics and Gynecology

## 2021-03-22 ENCOUNTER — Other Ambulatory Visit: Payer: Self-pay

## 2021-03-22 VITALS — BP 81/54 | HR 72

## 2021-03-22 DIAGNOSIS — N898 Other specified noninflammatory disorders of vagina: Secondary | ICD-10-CM

## 2021-03-22 NOTE — Progress Notes (Signed)
SUBJECTIVE:  35 y.o. female complains of white vaginal discharge for a couple of days.  Denies abnormal vaginal bleeding or significant pelvic pain or fever. No UTI symptoms. Denies history of known exposure to STD.  No LMP recorded. Patient has had an implant.  OBJECTIVE:  She appears well, afebrile. Urine dipstick: not done.  ASSESSMENT:  Vaginal Discharge:yes  Vaginal Odor: yes    PLAN:  GC, chlamydia, trichomonas, BVAG, CVAG probe sent to lab. Treatment: To be determined once lab results are received ROV prn if symptoms persist or worsen.

## 2021-03-23 LAB — CERVICOVAGINAL ANCILLARY ONLY
Bacterial Vaginitis (gardnerella): NEGATIVE
Candida Glabrata: NEGATIVE
Candida Vaginitis: NEGATIVE
Chlamydia: NEGATIVE
Comment: NEGATIVE
Comment: NEGATIVE
Comment: NEGATIVE
Comment: NEGATIVE
Comment: NEGATIVE
Comment: NORMAL
Neisseria Gonorrhea: NEGATIVE
Trichomonas: NEGATIVE

## 2021-04-16 ENCOUNTER — Other Ambulatory Visit: Payer: Self-pay | Admitting: Obstetrics

## 2021-04-18 ENCOUNTER — Other Ambulatory Visit: Payer: Self-pay

## 2021-04-18 DIAGNOSIS — M549 Dorsalgia, unspecified: Secondary | ICD-10-CM

## 2021-05-02 ENCOUNTER — Ambulatory Visit: Payer: 59 | Admitting: Obstetrics

## 2021-05-11 ENCOUNTER — Other Ambulatory Visit: Payer: Self-pay | Admitting: Obstetrics

## 2021-05-11 DIAGNOSIS — R52 Pain, unspecified: Secondary | ICD-10-CM

## 2021-05-11 MED ORDER — IBUPROFEN 800 MG PO TABS: 800.0000 mg | ORAL_TABLET | Freq: Three times a day (TID) | ORAL | 5 refills | Status: DC | PRN

## 2021-05-18 ENCOUNTER — Encounter: Payer: Self-pay | Admitting: Obstetrics and Gynecology

## 2021-05-18 ENCOUNTER — Other Ambulatory Visit (HOSPITAL_COMMUNITY)
Admission: RE | Admit: 2021-05-18 | Discharge: 2021-05-18 | Disposition: A | Discharge status: Home / Self Care | Payer: 59 | Source: Ambulatory Visit | Attending: Obstetrics and Gynecology | Admitting: Obstetrics and Gynecology

## 2021-05-18 ENCOUNTER — Other Ambulatory Visit: Payer: Self-pay

## 2021-05-18 ENCOUNTER — Ambulatory Visit (INDEPENDENT_AMBULATORY_CARE_PROVIDER_SITE_OTHER): Payer: 59 | Admitting: Obstetrics and Gynecology

## 2021-05-18 VITALS — BP 118/82 | HR 90 | Wt 237.0 lb

## 2021-05-18 DIAGNOSIS — R300 Dysuria: Secondary | ICD-10-CM

## 2021-05-18 DIAGNOSIS — R109 Unspecified abdominal pain: Secondary | ICD-10-CM | POA: Diagnosis not present

## 2021-05-18 DIAGNOSIS — R112 Nausea with vomiting, unspecified: Secondary | ICD-10-CM | POA: Insufficient documentation

## 2021-05-18 DIAGNOSIS — R1032 Left lower quadrant pain: Secondary | ICD-10-CM

## 2021-05-18 DIAGNOSIS — N898 Other specified noninflammatory disorders of vagina: Secondary | ICD-10-CM | POA: Diagnosis present

## 2021-05-18 LAB — POCT URINALYSIS DIPSTICK
Bilirubin, UA: NEGATIVE
Glucose, UA: NEGATIVE
Ketones, UA: NEGATIVE
Leukocytes, UA: NEGATIVE
Nitrite, UA: NEGATIVE
Protein, UA: POSITIVE — AB
Spec Grav, UA: 1.02 (ref 1.010–1.025)
Urobilinogen, UA: 0.2 E.U./dL
pH, UA: 5 (ref 5.0–8.0)

## 2021-05-18 MED ORDER — TAMSULOSIN HCL 0.4 MG PO CAPS: 0.4000 mg | ORAL_CAPSULE | Freq: Every day | ORAL | 2 refills | Status: AC

## 2021-05-18 MED ORDER — ONDANSETRON 4 MG PO TBDP: 4.0000 mg | ORAL_TABLET | Freq: Four times a day (QID) | ORAL | 0 refills | Status: AC | PRN

## 2021-05-18 NOTE — Progress Notes (Addendum)
GYN presents for recurring UTI. C/o pain in back, abdomin 6/10, urinary frequency, burning, odor x 7 days.

## 2021-05-18 NOTE — Progress Notes (Signed)
  CC: flank pain Subjective:    Patient ID: Julia Fowler, female    DOB: [birth year], 35 y.o.   MRN: [id number]  Urinary Tract Infection   35 yo G4P4 with c/s x 4 seen for report of UTI symptoms x 2-3 weeks.  The patient notes mild dysuria, left flank pain and nausea.  She denies fever/chills.  Pt UA today had 3+ blood with no leucocytes.  She just completed a 7 day course of macrobid with small improvement of symptoms.  Pt notes remote hx of kidney stones.   Review of Systems     Objective:   Physical Exam Constitutional:      Appearance: Normal appearance. She is obese.  Abdominal:     General: Abdomen is flat. There is no distension.     Palpations: Abdomen is soft.     Tenderness: There is no abdominal tenderness. There is no guarding.  Musculoskeletal:     Comments: Very mild left sided CVA tenderness.  Neurological:     Mental Status: She is alert.   Vitals:   05/18/21 1336  BP: 118/82  Pulse: 90         Assessment & Plan:   1. Flank pain physical exam findings with UA are suspicious for kidney stones.  Add flomax to aid passage if present.  Pt advised to increase hydration  - tamsulosin (FLOMAX) 0.4 MG CAPS capsule; Take 1 capsule (0.4 mg total) by mouth daily.  Dispense: 30 capsule; Refill: 2  2. Dysuria Follow up on urine culture results - Urine Culture - POCT Urinalysis Dipstick  3. Left lower quadrant abdominal pain Scan to eval kidneys and renal system for kidney stones  - CT ABDOMEN PELVIS W WO CONTRAST; Future - Cervicovaginal ancillary only( [place])  4. Nausea and vomiting, unspecified vomiting type  - ondansetron (ZOFRAN ODT) 4 MG disintegrating tablet; Take 1 tablet (4 mg total) by mouth every 6 (six) hours as needed for nausea.  Dispense: 20 tablet; Refill: 0  5. Vaginal discharge Eval for possible STD - Cervicovaginal ancillary only( [place])  Virtual follow up in 3 weeks. If kidney stones are present, will refer to  urology.  Warden Fillers, MD Faculty Attending, [facility]

## 2021-05-22 LAB — CERVICOVAGINAL ANCILLARY ONLY
Bacterial Vaginitis (gardnerella): NEGATIVE
Candida Glabrata: NEGATIVE
Candida Vaginitis: NEGATIVE
Chlamydia: NEGATIVE
Comment: NEGATIVE
Comment: NEGATIVE
Comment: NEGATIVE
Comment: NEGATIVE
Comment: NEGATIVE
Comment: NORMAL
Neisseria Gonorrhea: NEGATIVE
Trichomonas: NEGATIVE

## 2021-05-24 LAB — URINE CULTURE

## 2021-06-09 ENCOUNTER — Encounter: Payer: Self-pay | Admitting: Obstetrics and Gynecology

## 2021-06-12 NOTE — Progress Notes (Signed)
Received prior authorization approval for CT scan from [facility]. Message forwarded to scheduler.

## 2021-06-13 ENCOUNTER — Telehealth: Payer: 59 | Admitting: Obstetrics and Gynecology

## 2021-06-23 ENCOUNTER — Ambulatory Visit (HOSPITAL_COMMUNITY): Admission: RE | Admit: 2021-06-23 | Payer: 59 | Source: Ambulatory Visit

## 2021-06-24 ENCOUNTER — Ambulatory Visit (HOSPITAL_COMMUNITY)
Admission: RE | Admit: 2021-06-24 | Discharge: 2021-06-24 | Disposition: A | Discharge status: Home / Self Care | Payer: 59 | Source: Ambulatory Visit | Attending: Obstetrics and Gynecology | Admitting: Obstetrics and Gynecology

## 2021-06-24 ENCOUNTER — Other Ambulatory Visit: Payer: Self-pay

## 2021-06-24 DIAGNOSIS — R1032 Left lower quadrant pain: Secondary | ICD-10-CM | POA: Diagnosis present

## 2021-08-13 ENCOUNTER — Other Ambulatory Visit: Payer: Self-pay | Admitting: Obstetrics and Gynecology

## 2021-08-13 DIAGNOSIS — R109 Unspecified abdominal pain: Secondary | ICD-10-CM

## 2021-08-23 NOTE — Progress Notes (Signed)
Subjective:  Patient Name: Julia Fowler Date of Birth: 10/07/1985  MRN: [id number]  Julia Fowler  presents to the office today for follow up evaluation and management of poorly controlled  X-linked hypophosphatemic rickets, acquired primary hypothyroidism, goiter, thyroiditis, morbid obesity.   HISTORY OF PRESENT ILLNESS:   [name] is a 36 y.o. African-American woman.Julia Fowler was accompanied by her son, Julia Fowler.   1. [name] had her initial endocrine consultation on 09/28/20:  A. XLHR:   1). 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 She was treated with phosphorus, calcitriol, and vitamin D.   2)aortic stenosis an adult she stopped taking the above medications but has taken a MVI.    3). She continues to have pains in her knees and back, and sometimes in her elbows and hands.    4). She has had several fractures including her right ankle and both knees. She also tore the meniscus of her right knee and had arthroscopy.     5). Her daughter is on Crysvita treatment now and [name] wants to try it.    B. Pertinent past medical history:   1). Medical problems:    A). Acquired hypothyroidism: She never had thyroid surgery, radioactive iodine , or went on a prolonged low-iodine diet.     B). Obesity   2). Surgeries: 3 cesarean sections, arthroscopy right knee   3). Allergies: No known medication allergies   4). GYN:G3,P3; She now has a Nexplanon implant.   5). Medications: Levothyroxine, 75 mcg/day; Nexplanon implant; Tramadol; Ambien  C. Pertinent family history   1). XLHR: son, daughter father,and twin sister   2). Thyroid disease: Son has goiter.   3). Obesity: Her mother and maternal aunts   4). DM in paternal grandmother.    5). ASCVD: None   6). Cancer: Older sister has breast cancer   7). None   2. [name]' last Endocrine Clinic visit occurred on 09/28/20. I ordered lab tests in preparation for starting her on Crysvita, but most of the lab tests were never performed. She was  supposed to return to clinic in 3 months, but did not. Her June 2022 visit was cancelled. She was a No Show for her visit in August 2022. I was later informed by a third party that she had decided to seek care with the [facility].  A. In the interim she has been fairly healthy.   B. She has a genicular nerve ablation on her right knee recently.   C. She states that she wants me to take care of her and her son. She says she never planned on seeking care elsewhere.   D. She is taking 75 mcg of levothyroxine daily. She is not taking any phosphorus, calcium, or vitamin D.  3. Pertinent Review of Systems:  Constitutional: The patient feels tired all the time. Her body aches a lot. She has difficulty sleeping due to her many pains.  Eyes: Vision is good. There are no significant eye complaints. Neck: The patient has frequent complaints of anterior neck discomfort/pain, but no difficulty swallowing.  Heart: Heart rate sometimes increases on its own.  The patient has no complaints of palpitations, irregular heat beats, chest pain, or chest pressure. Gastrointestinal: She has diarrhea about every other month. Bowel movents seem normal otherwise. The patient has no complaints of excessive hunger, acid reflux, upset stomach, stomach aches or pains. Hands and arms: Arms are frequently weak and she has frequent pains in her elbows.  Legs:  Right knee hurts more than the left.  Ankles are often sore. She has a sensation of numbness of her entire right leg. Muscle mass and strength seem normal. There are no other complaints of numbness, tingling, burning, or pain. No edema is noted. Feet: There are no obvious foot problems. There are no complaints of numbness, tingling, burning, or pain. No edema is noted. GYN: Nexplanon implant is in place.    PAST MEDICAL, FAMILY, AND SOCIAL HISTORY:  Past Medical History:  Diagnosis Date   Hypothyroidism    no medication   Rickets    Seasonal allergies     Thyroid disease hypothyroid    Family History  Problem Relation Age of Onset   Cancer Sister        breast   Diabetes Maternal Grandmother      Current Outpatient Medications:    levothyroxine (SYNTHROID) 75 MCG tablet, Take 75 mcg by mouth daily., Disp: , Rfl:    norethindrone (AYGESTIN) 5 MG tablet, TAKE ONE TABLET EVERY DAY, Disp: 30 tablet, Rfl: 0   clindamycin (CLEOCIN) 300 MG capsule, Take 1 capsule (300 mg total) by mouth 3 (three) times daily. (Patient not taking: Reported on 05/18/2021), Disp: 21 capsule, Rfl: 0   etonogestrel (NEXPLANON) 68 MG IMPL implant, 1 each by Subdermal route once. (Patient not taking: Reported on 08/24/2021), Disp: , Rfl:    ibuprofen (ADVIL) 800 MG tablet, Take 1 tablet (800 mg total) by mouth every 8 (eight) hours as needed. (Patient not taking: Reported on 08/24/2021), Disp: 30 tablet, Rfl: 5   nitrofurantoin, macrocrystal-monohydrate, (MACROBID) 100 MG capsule, Take 1 capsule (100 mg total) by mouth 2 (two) times daily. 1 po BID x 7days (Patient not taking: Reported on 05/18/2021), Disp: 14 capsule, Rfl: 2   ondansetron (ZOFRAN ODT) 4 MG disintegrating tablet, Take 1 tablet (4 mg total) by mouth every 6 (six) hours as needed for nausea. (Patient not taking: Reported on 08/24/2021), Disp: 20 tablet, Rfl: 0   oxyCODONE-acetaminophen (PERCOCET/ROXICET) 5-325 MG tablet, Take 1 tablet by mouth every 4 (four) hours as needed for severe pain. (Patient not taking: Reported on 05/18/2021), Disp: 20 tablet, Rfl: 0   tamsulosin (FLOMAX) 0.4 MG CAPS capsule, Take 1 capsule (0.4 mg total) by mouth daily. (Patient not taking: Reported on 08/24/2021), Disp: 30 capsule, Rfl: 2   terconazole (TERAZOL 3) 0.8 % vaginal cream, Place 1 applicator vaginally at bedtime. (Patient not taking: Reported on 08/24/2021), Disp: 20 g, Rfl: 2   zolpidem (AMBIEN) 5 MG tablet, Take 1 tablet (5 mg total) by mouth at bedtime as needed for sleep. 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 .  2. Activities: Work and family 3. Smoking, alcohol, or drugs: Occasional alcohol, no tobacco, no recreational drugs 4. Primary Care Provider: [facility]  REVIEW OF SYSTEMS: There are no other significant problems involving Adrian's other body systems.   Objective:  Vital Signs:  BP 120/78 (BP Location: Right Arm, Patient Position: Sitting, Cuff Size: Normal)    Pulse 86    Wt 238 lb 9.6 oz (108.2 kg)    BMI 48.19 kg/m    Ht Readings from Last 3 Encounters:  01/05/20 4\' 11"  (1.499 m)  01/01/20 4\' 11"  (  1.499 m)  12/15/19 4\' 11"  (1.499 m)   Wt Readings from Last 3 Encounters:  08/24/21 238 lb 9.6 oz (108.2 kg)  05/18/21 237 lb (107.5 kg)  09/28/20 231 lb 6.4 oz (105 kg)   HC Readings from Last 3 Encounters:  [facility]   Body surface area is 2.12 meters squared.  PHYSICAL EXAM:  Constitutional: The patient appears healthy, morbidly obese, and antalgic. She has gained 7 pounds in the past 11 months. She still finds it difficult to sit still for very long. She is alert and bright. Her affect and insight are normal.   Face: The face appears normal.  Eyes: There is no obvious arcus or proptosis. Moisture appears normal. Mouth: The oropharynx and tongue appear normal. Oral moisture is normal. Neck: The neck appears to be visibly normal. No carotid bruits are noted. The thyroid gland is again mildly enlarged at 21 grams in size. The consistency of the thyroid gland is somewhat full bilaterally. The thyroid gland is not tender to palpation today.  Lungs: The lungs are clear to auscultation. Air movement is good. Heart: Heart rate and rhythm are regular. Heart sounds S1 and S2 are normal. I did not appreciate any pathologic  cardiac murmurs. Abdomen: The abdomen is quite obese. Bowel sounds are normal. There is no obvious hepatomegaly, splenomegaly, or other mass effect.  Arms: Muscle size and bulk are normal for age. Hands: There is no obvious tremor. Phalangeal and metacarpophalangeal joints are normal. Palmar muscles are normal. Palmar skin is normal. Palmar moisture is also normal. Legs: Muscles appear normal for age. No edema is present. Neurologic: Strength is normal for age in both the upper and lower extremities. Muscle tone is normal. Sensation to touch is normal in both legs.    LAB DATA:  No results found for this or any previous visit (from the past 504 hour(s)).  Labs 09/28/20: Phosphorus 2.1 (ref 2.5-4.5)   Assessment and Plan:   ASSESSMENT:  1. XLHR:  A. She has had this problem for many years. She still has pains in many parts of her body. She is not under treatment now. She would like to try Crysvita.  B. Her phosphorus level in March 2022 was low. Unfortunately.l all of the other tests were not done.  C. She understands that she will have to return for frequent lab tests and close follow up if we proceed with Crysvita treatment.  2. Hypothyroid, acquired, autoimmune/goiter: She presumably has Hashimoto's disease. She is clinically hypothyroid today. 3. Morbid obesity: This problem has large genetic component.  4. 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 5. Right knee pain, chronic: This problem may be linked to there XLHR. She had a nerve ablation procedure recently.  6. Hypertension: Her DBP has increased, paralleling her increase in weight.   PLAN:  1. Diagnostic: TFT, PTH, calcium, CMP, phosphorus, 25-OH vitamin D, calcitriol 2. Therapeutic: I will order Crysvita once her lab results are available.   3. Patient education: I showed her a handout on Crysvita, with both its advantages and disadvantages. I told her that I can't ethically  order the medication unless she is willing to return for follow up lab tests and clinic visits as the Crysvita literature recommends. She wishes to proceed with treatment. 4. Follow-up: 2 months  Level of Service: This visit lasted in excess of 60 minutes. More than 50% of the visit was devoted to counseling.  Molli Knock, MD, CDE Adult and Pediatric Endocrinology

## 2021-08-24 ENCOUNTER — Encounter (INDEPENDENT_AMBULATORY_CARE_PROVIDER_SITE_OTHER): Payer: Self-pay | Admitting: "Endocrinology

## 2021-08-24 ENCOUNTER — Ambulatory Visit (INDEPENDENT_AMBULATORY_CARE_PROVIDER_SITE_OTHER): Payer: Managed Care, Other (non HMO) | Admitting: "Endocrinology

## 2021-08-24 ENCOUNTER — Other Ambulatory Visit: Payer: Self-pay

## 2021-08-24 VITALS — BP 120/78 | HR 86 | Wt 238.6 lb

## 2021-08-24 DIAGNOSIS — E063 Autoimmune thyroiditis: Secondary | ICD-10-CM

## 2021-08-24 DIAGNOSIS — M9080 Osteopathy in diseases classified elsewhere, unspecified site: Secondary | ICD-10-CM

## 2021-08-24 DIAGNOSIS — E049 Nontoxic goiter, unspecified: Secondary | ICD-10-CM

## 2021-08-24 DIAGNOSIS — I10 Essential (primary) hypertension: Secondary | ICD-10-CM

## 2021-08-24 NOTE — Patient Instructions (Signed)
Follow up visit in 2 months.  

## 2021-08-29 LAB — VITAMIN D 1,25 DIHYDROXY
Vitamin D 1, 25 (OH)2 Total: 42 pg/mL (ref 18–72)
Vitamin D2 1, 25 (OH)2: <8 pg/mL
Vitamin D3 1, 25 (OH)2: 42 pg/mL

## 2021-08-29 LAB — COMPREHENSIVE METABOLIC PANEL
AG Ratio: 1.3 (calc) (ref 1.0–2.5)
ALT: 16 U/L (ref 6–29)
AST: 19 U/L (ref 10–30)
Albumin: 4.1 g/dL (ref 3.6–5.1)
Alkaline phosphatase (APISO): 77 U/L (ref 31–125)
BUN: 16 mg/dL (ref 7–25)
CO2: 21 mmol/L (ref 20–32)
Calcium: 9.1 mg/dL (ref 8.6–10.2)
Chloride: 104 mmol/L (ref 98–110)
Creat: 0.67 mg/dL (ref 0.50–0.97)
Globulin: 3.1 g/dL (calc) (ref 1.9–3.7)
Glucose, Bld: 81 mg/dL (ref 65–139)
Potassium: 3.8 mmol/L (ref 3.5–5.3)
Sodium: 136 mmol/L (ref 135–146)
Total Bilirubin: 0.4 mg/dL (ref 0.2–1.2)
Total Protein: 7.2 g/dL (ref 6.1–8.1)

## 2021-08-29 LAB — VITAMIN D 25 HYDROXY (VIT D DEFICIENCY, FRACTURES): Vit D, 25-Hydroxy: 28 ng/mL — ABNORMAL LOW (ref 30–100)

## 2021-08-29 LAB — TSH: TSH: 2.23 mIU/L

## 2021-08-29 LAB — T4, FREE: Free T4: 1.3 ng/dL (ref 0.8–1.8)

## 2021-08-29 LAB — T3, FREE: T3, Free: 3 pg/mL (ref 2.3–4.2)

## 2021-08-29 LAB — PTH, INTACT AND CALCIUM
Calcium: 9.1 mg/dL (ref 8.6–10.2)
PTH: 45 pg/mL (ref 16–77)

## 2021-08-29 LAB — PHOSPHORUS: Phosphorus: 2.2 mg/dL — ABNORMAL LOW (ref 2.5–4.5)

## 2021-09-03 ENCOUNTER — Other Ambulatory Visit: Payer: Self-pay | Admitting: Obstetrics & Gynecology

## 2021-09-03 DIAGNOSIS — N939 Abnormal uterine and vaginal bleeding, unspecified: Secondary | ICD-10-CM

## 2021-09-04 ENCOUNTER — Encounter: Payer: Self-pay | Admitting: Obstetrics

## 2021-09-05 ENCOUNTER — Encounter (INDEPENDENT_AMBULATORY_CARE_PROVIDER_SITE_OTHER): Payer: Self-pay | Admitting: "Endocrinology

## 2021-09-05 ENCOUNTER — Telehealth: Payer: Self-pay

## 2021-09-05 ENCOUNTER — Other Ambulatory Visit (INDEPENDENT_AMBULATORY_CARE_PROVIDER_SITE_OTHER): Payer: Self-pay

## 2021-09-05 MED ORDER — LEVOTHYROXINE SODIUM 75 MCG PO TABS: ORAL_TABLET | ORAL | 5 refills | Status: DC

## 2021-09-05 NOTE — Telephone Encounter (Signed)
Called pt to discuss mychart message, no answer, left vm

## 2021-09-06 ENCOUNTER — Other Ambulatory Visit: Payer: Self-pay

## 2021-09-06 DIAGNOSIS — N939 Abnormal uterine and vaginal bleeding, unspecified: Secondary | ICD-10-CM

## 2021-09-06 MED ORDER — NORETHINDRONE ACETATE 5 MG PO TABS: 5.0000 mg | ORAL_TABLET | Freq: Every day | ORAL | 1 refills | Status: DC

## 2021-09-07 NOTE — Telephone Encounter (Signed)
Sent secure chat message to provider regarding message sent at 3:37 on 2/28 ?

## 2021-09-20 ENCOUNTER — Encounter (INDEPENDENT_AMBULATORY_CARE_PROVIDER_SITE_OTHER): Payer: Self-pay

## 2021-09-28 ENCOUNTER — Other Ambulatory Visit: Payer: Self-pay | Admitting: Obstetrics

## 2021-09-28 DIAGNOSIS — N939 Abnormal uterine and vaginal bleeding, unspecified: Secondary | ICD-10-CM

## 2021-10-05 ENCOUNTER — Telehealth (INDEPENDENT_AMBULATORY_CARE_PROVIDER_SITE_OTHER): Payer: Self-pay | Admitting: "Endocrinology

## 2021-10-05 NOTE — Telephone Encounter (Signed)
?  Name of who is calling:Marcos ? ?Caller's Relationship to Patient: ? ?Best contact number:[phone] ? ?Provider they see:Dr.Brennan  ? ?Reason for call:missing information in the referral that was sent. Please call Bunnie Pion back @ [phone]  ? ? ? ? ?PRESCRIPTION REFILL ONLY ? ?Name of prescription: ? ?Pharmacy: ? ? ?

## 2021-10-09 NOTE — Telephone Encounter (Signed)
Called. LVM for [facility] to return call.  ?

## 2021-10-10 ENCOUNTER — Encounter (INDEPENDENT_AMBULATORY_CARE_PROVIDER_SITE_OTHER): Payer: Self-pay

## 2021-10-10 ENCOUNTER — Telehealth (INDEPENDENT_AMBULATORY_CARE_PROVIDER_SITE_OTHER): Payer: Self-pay | Admitting: "Endocrinology

## 2021-10-10 NOTE — Telephone Encounter (Signed)
?  Name of who is calling:Jennifer  ? ?Caller's Relationship to Patient:[facility]  ? ?Best contact number:[phone] ? ?Provider they see:Dr. Fransico Michael  ? ?Reason for call:Caller requested a call back due to forms not being complete and she has questions about medication. Please advise  ? ? ? ? ?PRESCRIPTION REFILL ONLY ? ?Name of prescription: ? ?Pharmacy: ? ? ?

## 2021-10-10 NOTE — Telephone Encounter (Signed)
Spoke with [employer]. She explained that the for wasn't filled out completely. Section 8 needed to be revised with dosage and to send in insurance cards. I have fixed where it needed to be fixed and placed it for Dr Tobe Sos to initial. I will send it back to the patient to be signed as the first one she faxed back wasn't legible.  ?

## 2021-10-12 ENCOUNTER — Other Ambulatory Visit: Payer: Self-pay | Admitting: Family Medicine

## 2021-10-12 DIAGNOSIS — Z1231 Encounter for screening mammogram for malignant neoplasm of breast: Secondary | ICD-10-CM

## 2021-10-12 NOTE — Telephone Encounter (Signed)
Jennifer with [facility] is calling back to follow up with Tiffany regarding if she spoke to Dr. Brennan. Jennifer is requesting a call back.  ?

## 2021-10-17 NOTE — Telephone Encounter (Addendum)
Spoke with [employer]. Forms are completed and will fax.  ? ?10-24-21: Spoke with Victorino Dike at [facility]. The medication has been sent to the specialty pharmacy. They will initiate th PA and will reach out to [place] for medical records.  ?

## 2021-10-23 NOTE — Progress Notes (Deleted)
Subjective:  Patient Name: Julia Fowler Date of Birth: 1985-08-03  MRN: [id number]  Julia Fowler  presents to the office today for follow up evaluation and management of poorly controlled  X-linked hypophosphatemic rickets, acquired primary hypothyroidism, goiter, thyroiditis, morbid obesity.   HISTORY OF PRESENT ILLNESS:   Julia Fowler is a 36 y.o. African-American woman.Julia Fowler was accompanied by her son, [name].   1. Julia Fowler had her initial endocrine consultation on 09/28/20:  A. XLHR:   1). 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 She was treated with phosphorus, calcitriol, and vitamin D.   2)aortic stenosis an adult she stopped taking the above medications but has taken a MVI.    3). She continues to have pains in her knees and back, and sometimes in her elbows and hands.    4). She has had several fractures including her right ankle and both knees. She also tore the meniscus of her right knee and had arthroscopy.     5). Her daughter is on Crysvita treatment now and Julia Fowler wants to try it.    B. Pertinent past medical history:   1). Medical problems:    A). Acquired hypothyroidism: She never had thyroid surgery, radioactive iodine , or went on a prolonged low-iodine diet.     B). Obesity   2). Surgeries: 3 cesarean sections, arthroscopy right knee   3). Allergies: No known medication allergies   4). GYN:G3,P3; She now has a Nexplanon implant.   5). Medications: Levothyroxine, 75 mcg/day; Nexplanon implant; Tramadol; Ambien  C. Pertinent family history   1). XLHR: son, daughter father,and twin sister   2). Thyroid disease: Son has goiter.   3). Obesity: Her mother and maternal aunts   4). DM in paternal grandmother.    5). ASCVD: None   6). Cancer: Older sister has breast cancer   7). None  2. Julia Fowler' last Endocrine Clinic visit occurred on 08/24/21. I ordered lab tests in preparation for starting her on Crysvita, but most of the lab tests were never performed. She was  supposed to return to clinic in 3 months, but did not. Her June 2022 visit was cancelled. She was a No Show for her visit in August 2022. I was later informed by a third party that she had decided to seek care with the [facility].  A. In the interim she has been fairly healthy.   B. She has a genicular nerve ablation on her right knee recently.   C. She states that she wants me to take care of her and her son. She says she never planned on seeking care elsewhere.   D. She is taking 75 mcg of levothyroxine daily. She is not taking any phosphorus, calcium, or vitamin D.  3. Pertinent Review of Systems:  Constitutional: The patient feels tired all the time. Her body aches a lot. She has difficulty sleeping due to her many pains.  Eyes: Vision is good. There are no significant eye complaints. Neck: The patient has frequent complaints of anterior neck discomfort/pain, but no difficulty swallowing.  Heart: Heart rate sometimes increases on its own.  The patient has no complaints of palpitations, irregular heat beats, chest pain, or chest pressure. Gastrointestinal: She has diarrhea about every other month. Bowel movents seem normal otherwise. The patient has no complaints of excessive hunger, acid reflux, upset stomach, stomach aches or pains. Hands and arms: Arms are frequently weak and she has frequent pains in her elbows.  Legs: Right  knee hurts more than the left.  Ankles are often sore. She has a sensation of numbness of her entire right leg. Muscle mass and strength seem normal. There are no other complaints of numbness, tingling, burning, or pain. No edema is noted. Feet: There are no obvious foot problems. There are no complaints of numbness, tingling, burning, or pain. No edema is noted. GYN: Nexplanon implant is in place.    PAST MEDICAL, FAMILY, AND SOCIAL HISTORY:  Past Medical History:  Diagnosis Date   Hypothyroidism    no medication   Rickets    Seasonal allergies     Thyroid disease hypothyroid    Family History  Problem Relation Age of Onset   Cancer Sister        breast   Diabetes Maternal Grandmother      Current Outpatient Medications:    clindamycin (CLEOCIN) 300 MG capsule, Take 1 capsule (300 mg total) by mouth 3 (three) times daily. (Patient not taking: Reported on 05/18/2021), Disp: 21 capsule, Rfl: 0   etonogestrel (NEXPLANON) 68 MG IMPL implant, 1 each by Subdermal route once. (Patient not taking: Reported on 08/24/2021), Disp: , Rfl:    ibuprofen (ADVIL) 800 MG tablet, Take 1 tablet (800 mg total) by mouth every 8 (eight) hours as needed. (Patient not taking: Reported on 08/24/2021), Disp: 30 tablet, Rfl: 5   levothyroxine (SYNTHROID) 75 MCG tablet, Take 1 tablet daily., Disp: 30 tablet, Rfl: 5   nitrofurantoin, macrocrystal-monohydrate, (MACROBID) 100 MG capsule, Take 1 capsule (100 mg total) by mouth 2 (two) times daily. 1 po BID x 7days (Patient not taking: Reported on 05/18/2021), Disp: 14 capsule, Rfl: 2   norethindrone (AYGESTIN) 5 MG tablet, TAKE 1 TABLET (5 MG TOTAL) BY MOUTH DAILY., Disp: 30 tablet, Rfl: 1   ondansetron (ZOFRAN ODT) 4 MG disintegrating tablet, Take 1 tablet (4 mg total) by mouth every 6 (six) hours as needed for nausea. (Patient not taking: Reported on 08/24/2021), Disp: 20 tablet, Rfl: 0   oxyCODONE-acetaminophen (PERCOCET/ROXICET) 5-325 MG tablet, Take 1 tablet by mouth every 4 (four) hours as needed for severe pain. (Patient not taking: Reported on 05/18/2021), Disp: 20 tablet, Rfl: 0   tamsulosin (FLOMAX) 0.4 MG CAPS capsule, Take 1 capsule (0.4 mg total) by mouth daily. (Patient not taking: Reported on 08/24/2021), Disp: 30 capsule, Rfl: 2   terconazole (TERAZOL 3) 0.8 % vaginal cream, Place 1 applicator vaginally at bedtime. (Patient not taking: Reported on 08/24/2021), Disp: 20 g, Rfl: 2   zolpidem (AMBIEN) 5 MG tablet, Take 1 tablet (5 mg total) by mouth at bedtime as needed for sleep. 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 .  2. Activities: Work and family 3. Smoking, alcohol, or drugs: Occasional alcohol, no tobacco, no recreational drugs 4. Primary Care Provider: [facility]  REVIEW OF SYSTEMS: There are no other significant problems involving Julia Fowler's other body systems.   Objective:  Vital Signs:  There were no vitals taken for this visit.   Ht Readings from Last 3 Encounters:  01/05/20  (1.499 m)  01/01/20  (1.499 m)  12/15/19  (1.499 m)   Wt Readings from Last 3 Encounters:  08/24/21 238 lb 9.6 oz (108.2 kg)  05/18/21 237 lb (107.5 kg)  09/28/20 231 lb 6.4 oz (105 kg)   HC Readings from Last 3 Encounters:  No data found for HC   There is no height or weight on file to calculate BSA.  PHYSICAL EXAM:  Constitutional: The patient appears healthy, morbidly obese, and antalgic. She has gained 7 pounds in the past 11 months. She still finds it difficult to sit still for very long. She is alert and bright. Her affect and insight are normal.   Face: The face appears normal.  Eyes: There is no obvious arcus or proptosis. Moisture appears normal. Mouth: The oropharynx and tongue appear normal. Oral moisture is normal. Neck: The neck appears to be visibly normal. No carotid bruits are noted. The thyroid gland is again mildly enlarged at 21 grams in size. The consistency of the thyroid gland is somewhat full bilaterally. The thyroid gland is not tender to palpation today.  Lungs: The lungs are clear to auscultation. Air movement is good. Heart: Heart rate and rhythm are regular. Heart sounds S1 and S2 are normal. I did not appreciate any pathologic cardiac murmurs. Abdomen: The abdomen is quite obese. Bowel  sounds are normal. There is no obvious hepatomegaly, splenomegaly, or other mass effect.  Arms: Muscle size and bulk are normal for age. Hands: There is no obvious tremor. Phalangeal and metacarpophalangeal joints are normal. Palmar muscles are normal. Palmar skin is normal. Palmar moisture is also normal. Legs: Muscles appear normal for age. No edema is present. Neurologic: Strength is normal for age in both the upper and lower extremities. Muscle tone is normal. Sensation to touch is normal in both legs.    LAB DATA:  No results found for this or any previous visit (from the past 504 hour(s)).  Labs 08/25/21: TSH 2.23, free T4 1.3, free T3 3.0; CMP normal; PTH 45 (ref 16-77), calcium 9.1 (ref 8.6-10.2), phosphorus 2.2 (ref 2.5-4.5), 25-OH vitamin D 28, 1,25-dihydroxy vitamin D 42 (ref 18072)  Labs 09/28/20: Phosphorus 2.1 (ref 2.5-4.5)   Assessment and Plan:   ASSESSMENT:  1. XLHR:  A. She has had this problem for many years. She still has pains in many parts of her body. She is not under treatment now. She would like to try Crysvita.  B. Her phosphorus level in March 2022 was low. Unfortunately.l all of the other tests were not done.  C. She understands that she will have to return for frequent lab tests and close follow up if we proceed with Crysvita treatment.  2. Hypothyroid, acquired, autoimmune/goiter: She presumably has Hashimoto's disease. She is clinically hypothyroid today. 3. Morbid obesity: This problem has large genetic component.  4. 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 5. Right knee pain, chronic: This problem may be linked to there XLHR. She had a nerve ablation procedure recently.  6. Hypertension: Her DBP has increased, paralleling her increase in weight.   PLAN:  1. Diagnostic: TFT, PTH, calcium, CMP, phosphorus, 25-OH vitamin D, calcitriol 2. Therapeutic: I will order Crysvita once her lab results are available.    3. Patient education: I showed her a handout on Crysvita, with both its advantages and disadvantages. I told her that I can't ethically order the medication unless she is willing to return for follow up lab tests and clinic visits as the Crysvita literature recommends. She wishes to proceed with treatment. 4. Follow-up: 2 months  Level of Service: This visit lasted in  excess of 60 minutes. More than 50% of the visit was devoted to counseling.  Molli KnockMichael Caelynn Marshman, MD, CDE Adult and Pediatric Endocrinology

## 2021-10-24 ENCOUNTER — Ambulatory Visit
Admission: RE | Admit: 2021-10-24 | Discharge: 2021-10-24 | Disposition: A | Discharge status: Home / Self Care | Payer: Managed Care, Other (non HMO) | Source: Ambulatory Visit | Attending: Family Medicine | Admitting: Family Medicine

## 2021-10-24 ENCOUNTER — Ambulatory Visit (INDEPENDENT_AMBULATORY_CARE_PROVIDER_SITE_OTHER): Payer: 59 | Admitting: "Endocrinology

## 2021-10-24 DIAGNOSIS — Z1231 Encounter for screening mammogram for malignant neoplasm of breast: Secondary | ICD-10-CM

## 2021-10-24 NOTE — Telephone Encounter (Signed)
Case manager working on the case is Estill Bamberg  ?[phone] ?

## 2021-10-29 ENCOUNTER — Other Ambulatory Visit: Payer: Self-pay | Admitting: Obstetrics

## 2021-10-29 DIAGNOSIS — N939 Abnormal uterine and vaginal bleeding, unspecified: Secondary | ICD-10-CM

## 2021-11-01 ENCOUNTER — Encounter (INDEPENDENT_AMBULATORY_CARE_PROVIDER_SITE_OTHER): Payer: Self-pay | Admitting: "Endocrinology

## 2021-11-06 ENCOUNTER — Telehealth (INDEPENDENT_AMBULATORY_CARE_PROVIDER_SITE_OTHER): Payer: Self-pay | Admitting: "Endocrinology

## 2021-11-06 NOTE — Telephone Encounter (Signed)
Who's calling (name and relationship to patient) : ?Aldona Bar  ?Pharmaceutical services ? ?Best contact number: ?[phone] ? ?Provider they see: ?Tobe Sos ? ?Reason for call: ?Needs notes from genetic testing to approve medication  ?Fax numbers [phone] ?Call ID:  ? ? ? ? ?PRESCRIPTION REFILL ONLY ? ?Name of prescription: ? ?Pharmacy: ? ? ? ? ? ?

## 2021-11-06 NOTE — Telephone Encounter (Signed)
Attempted calling this number back multiple times to discuss what they need. Phone number kept saying number not in service ?

## 2021-11-07 NOTE — Telephone Encounter (Signed)
Judeth Cornfield from [employer] called back and I spoke to her regarding them needing a copy of PHEX study. According to Dr Juluis Mire previous CMA Tiffany, that study was done on 08/02/2020. Please see that phone note encouter for the results of that study. The Study results were faxed to [facility] per her request. Her phone number is [phone]. And the fax# is [phone]. ?

## 2021-11-09 ENCOUNTER — Ambulatory Visit (INDEPENDENT_AMBULATORY_CARE_PROVIDER_SITE_OTHER): Payer: 59 | Admitting: "Endocrinology

## 2021-11-22 ENCOUNTER — Telehealth (INDEPENDENT_AMBULATORY_CARE_PROVIDER_SITE_OTHER): Payer: Self-pay

## 2021-11-22 NOTE — Telephone Encounter (Signed)
Received fax from [facility] stating that Crysvita as been APPROVED  thru 11/19/2022  Auth# [id number] ? ?J code for Crysvita is; [phone] ?

## 2021-12-04 ENCOUNTER — Other Ambulatory Visit: Payer: Self-pay | Admitting: Obstetrics

## 2021-12-04 DIAGNOSIS — N939 Abnormal uterine and vaginal bleeding, unspecified: Secondary | ICD-10-CM

## 2021-12-06 NOTE — Progress Notes (Deleted)
Subjective:  Patient Name: Julia Fowler Date of Birth: [birth year]  MRN: [id number]  Julia Fowler  presents to the office today for follow up evaluation and management of poorly controlled  X-linked hypophosphatemic rickets, acquired primary hypothyroidism, goiter, thyroiditis, morbid obesity.   HISTORY OF PRESENT ILLNESS:   Julia Fowler is a 36 y.o. African-American woman.Julia Fowler was accompanied by her son, Julia Fowler.   1. Julia Fowler had her initial endocrine consultation on 09/28/20:  A. XLHR:   1). 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 She was treated with phosphorus, calcitriol, and vitamin D.   2)aortic stenosis an adult she stopped taking the above medications but has taken a MVI.    3). She continues to have pains in her knees and back, and sometimes in her elbows and hands.    4). She has had several fractures including her right ankle and both knees. She also tore the meniscus of her right knee and had arthroscopy.     5). Her daughter is on Julia Fowler treatment now and Julia Fowler wants to try it.    B. Pertinent past medical history:   1). Medical problems:    A). Acquired hypothyroidism: She never had thyroid surgery, radioactive iodine , or went on a prolonged low-iodine diet.     B). Obesity   2). Surgeries: 3 cesarean sections, arthroscopy right knee   3). Allergies: No known medication allergies   4). GYN:G3,P3; She now has a Nexplanon implant.   5). Medications: Levothyroxine, 75 mcg/day; Nexplanon implant; Tramadol; Ambien  C. Pertinent family history   1). XLHR: son, daughter father,and twin sister   2). Thyroid disease: Son has goiter.   3). Obesity: Her mother and maternal aunts   4). DM in paternal grandmother.    5). ASCVD: None   6). Cancer: Older sister has breast cancer   7). None   2. Julia Fowler' last Endocrine Clinic visit occurred on 08/24/21. I ordered lab tests in preparation for starting her on Julia Fowler.    A. In the interim she has been fairly healthy.   B.  Julia Fowler was approved for her through 5/13/234 She has a genicular nerve ablation on her right knee recently.   C. She states that she wants me to take care of her and her son. She says she never planned on seeking care elsewhere.   D. She is taking 75 mcg of levothyroxine daily. She is not taking any phosphorus, calcium, or vitamin D.  3. Pertinent Review of Systems:  Constitutional: The patient feels tired all the time. Her body aches a lot. She has difficulty sleeping due to her many pains.  Eyes: Vision is good. There are no significant eye complaints. Neck: The patient has frequent complaints of anterior neck discomfort/pain, but no difficulty swallowing.  Heart: Heart rate sometimes increases on its own.  The patient has no complaints of palpitations, irregular heat beats, chest pain, or chest pressure. Gastrointestinal: She has diarrhea about every other month. Bowel movents seem normal otherwise. The patient has no complaints of excessive hunger, acid reflux, upset stomach, stomach aches or pains. Hands and arms: Arms are frequently weak and she has frequent pains in her elbows.  Legs: Right knee hurts more than the left.  Ankles are often sore. She has a sensation of numbness of her entire right leg. Muscle mass and strength seem normal. There are no other complaints of numbness, tingling, burning, or pain. No edema is noted. Feet: There are no obvious foot  problems. There are no complaints of numbness, tingling, burning, or pain. No edema is noted. GYN: Nexplanon implant is in place.    PAST MEDICAL, FAMILY, AND SOCIAL HISTORY:  Past Medical History:  Diagnosis Date   Hypothyroidism    no medication   Rickets    Seasonal allergies    Thyroid disease hypothyroid    Family History  Problem Relation Age of Onset   Cancer Sister        breast   Diabetes Maternal Grandmother      Current Outpatient Medications:    clindamycin (CLEOCIN) 300 MG capsule, Take 1 capsule (300 mg  total) by mouth 3 (three) times daily. (Patient not taking: Reported on 05/18/2021), Disp: 21 capsule, Rfl: 0   etonogestrel (NEXPLANON) 68 MG IMPL implant, 1 each by Subdermal route once. (Patient not taking: Reported on 08/24/2021), Disp: , Rfl:    ibuprofen (ADVIL) 800 MG tablet, Take 1 tablet (800 mg total) by mouth every 8 (eight) hours as needed. (Patient not taking: Reported on 08/24/2021), Disp: 30 tablet, Rfl: 5   levothyroxine (SYNTHROID) 75 MCG tablet, Take 1 tablet daily., Disp: 30 tablet, Rfl: 5   nitrofurantoin, macrocrystal-monohydrate, (MACROBID) 100 MG capsule, Take 1 capsule (100 mg total) by mouth 2 (two) times daily. 1 po BID x 7days (Patient not taking: Reported on 05/18/2021), Disp: 14 capsule, Rfl: 2   norethindrone (AYGESTIN) 5 MG tablet, TAKE 1 TABLET (5 MG TOTAL) BY MOUTH DAILY., Disp: 30 tablet, Rfl: 1   ondansetron (ZOFRAN ODT) 4 MG disintegrating tablet, Take 1 tablet (4 mg total) by mouth every 6 (six) hours as needed for nausea. (Patient not taking: Reported on 08/24/2021), Disp: 20 tablet, Rfl: 0   oxyCODONE-acetaminophen (PERCOCET/ROXICET) 5-325 MG tablet, Take 1 tablet by mouth every 4 (four) hours as needed for severe pain. (Patient not taking: Reported on 05/18/2021), Disp: 20 tablet, Rfl: 0   tamsulosin (FLOMAX) 0.4 MG CAPS capsule, Take 1 capsule (0.4 mg total) by mouth daily. (Patient not taking: Reported on 08/24/2021), Disp: 30 capsule, Rfl: 2   terconazole (TERAZOL 3) 0.8 % vaginal cream, Place 1 applicator vaginally at bedtime. (Patient not taking: Reported on 08/24/2021), Disp: 20 g, Rfl: 2   zolpidem (AMBIEN) 5 MG tablet, Take 1 tablet (5 mg total) by mouth at bedtime as needed for sleep. 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 .  2. Activities: Work and family 3. Smoking, alcohol, or drugs: Occasional alcohol, no tobacco, no recreational drugs 4. Primary Care Provider: [facility]  REVIEW OF SYSTEMS: There are no other significant problems involving Amalea's other body systems.   Objective:  Vital Signs:  There were no vitals taken for this visit.   Ht Readings from Last 3 Encounters:  01/05/20 4\' 11"  (1.499 m)  01/01/20 4\' 11"  (1.499 m)  12/15/19 4\' 11"  (1.499 m)   Wt Readings from Last 3 Encounters:  08/24/21 238 lb 9.6 oz (108.2 kg)  05/18/21 237 lb (107.5 kg)  09/28/20 231 lb 6.4 oz (105 kg)   HC Readings from Last 3 Encounters:  No data found for HC   There is no height or weight on file to calculate BSA.  PHYSICAL EXAM:  Constitutional: The patient appears  healthy, morbidly obese, and antalgic. She has gained 7 pounds in the past 11 months. She still finds it difficult to sit still for very long. She is alert and bright. Her affect and insight are normal.   Face: The face appears normal.  Eyes: There is no obvious arcus or proptosis. Moisture appears normal. Mouth: The oropharynx and tongue appear normal. Oral moisture is normal. Neck: The neck appears to be visibly normal. No carotid bruits are noted. The thyroid gland is again mildly enlarged at 21 grams in size. The consistency of the thyroid gland is somewhat full bilaterally. The thyroid gland is not tender to palpation today.  Lungs: The lungs are clear to auscultation. Air movement is good. Heart: Heart rate and rhythm are regular. Heart sounds S1 and S2 are normal. I did not appreciate any pathologic cardiac murmurs. Abdomen: The abdomen is quite obese. Bowel sounds are normal. There is no obvious hepatomegaly, splenomegaly, or other mass effect.  Arms: Muscle size and bulk are normal for age. Hands: There is no obvious tremor. Phalangeal and metacarpophalangeal joints are normal. Palmar muscles are normal. Palmar skin  is normal. Palmar moisture is also normal. Legs: Muscles appear normal for age. No edema is present. Neurologic: Strength is normal for age in both the upper and lower extremities. Muscle tone is normal. Sensation to touch is normal in both legs.    LAB DATA:  No results found for this or any previous visit (from the past 504 hour(s)).  Labs 08/25/21: TSH 2.23, free T4 1.3, free T3 3.0; CMP normal, with calcium 9.1 (ref 8.6-10.2) PTH 45 (ref 16-77); 25-OH vitamin D 28, 1,25-dihydroxy vitamin D 42 (ref 18-72); phosphorus 2.2 )ref 2.5-4.5)  Labs 09/28/20: Phosphorus 2.1 (ref 2.5-4.5)    Assessment and Plan:   ASSESSMENT:  1. XLHR:  A. She has had this problem for many years. She still has pains in many parts of her body. She is not under treatment now. She would like to try Julia Fowler.  B. Her phosphorus level in March 2022 was low. Unfortunately.l all of the other tests were not done.  C. She understands that she will have to return for frequent lab tests and close follow up if we proceed with Julia Fowler treatment.  2. Hypothyroid, acquired, autoimmune/goiter: She presumably has Hashimoto's disease. She is clinically hypothyroid today. 3. Morbid obesity: This problem has large genetic component.  4. 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 5. Right knee pain, chronic: This problem may be linked to there XLHR. She had a nerve ablation procedure recently.  6. Hypertension: Her DBP has increased, paralleling her increase in weight.   PLAN:  1. Diagnostic: TFT, PTH, calcium, CMP, phosphorus, 25-OH vitamin D, calcitriol 2. Therapeutic: I will order Julia Fowler once her lab results are available.   3. Patient education: I showed her a handout on Julia Fowler, with both its advantages and disadvantages. I told her that I can't ethically order the medication unless she is willing to return for follow up lab tests and clinic visits as the Julia Fowler literature  recommends. She wishes to proceed with treatment. 4. Follow-up: 2 months  Level of Service: This visit lasted in excess of 60 minutes. More than 50% of the visit was devoted to counseling.  Tillman Sers, MD, CDE Adult and Pediatric Endocrinology

## 2021-12-07 ENCOUNTER — Ambulatory Visit (INDEPENDENT_AMBULATORY_CARE_PROVIDER_SITE_OTHER): Payer: 59 | Admitting: "Endocrinology

## 2021-12-15 NOTE — Telephone Encounter (Signed)
Received mailed documentation stating that medication from [employer] had been approved

## 2021-12-19 ENCOUNTER — Encounter: Payer: Self-pay | Admitting: Obstetrics

## 2021-12-19 ENCOUNTER — Other Ambulatory Visit (HOSPITAL_COMMUNITY)
Admission: RE | Admit: 2021-12-19 | Discharge: 2021-12-19 | Disposition: A | Discharge status: Home / Self Care | Payer: Commercial Managed Care - HMO | Source: Ambulatory Visit | Attending: Obstetrics | Admitting: Obstetrics

## 2021-12-19 ENCOUNTER — Ambulatory Visit (INDEPENDENT_AMBULATORY_CARE_PROVIDER_SITE_OTHER): Payer: Commercial Managed Care - HMO | Admitting: Obstetrics

## 2021-12-19 VITALS — BP 121/83 | HR 83 | Ht 59.0 in | Wt 243.8 lb

## 2021-12-19 DIAGNOSIS — Z01411 Encounter for gynecological examination (general) (routine) with abnormal findings: Secondary | ICD-10-CM

## 2021-12-19 DIAGNOSIS — Z3046 Encounter for surveillance of implantable subdermal contraceptive: Secondary | ICD-10-CM

## 2021-12-19 DIAGNOSIS — Z01419 Encounter for gynecological examination (general) (routine) without abnormal findings: Secondary | ICD-10-CM | POA: Insufficient documentation

## 2021-12-19 DIAGNOSIS — Z6841 Body Mass Index (BMI) 40.0 and over, adult: Secondary | ICD-10-CM

## 2021-12-19 DIAGNOSIS — N898 Other specified noninflammatory disorders of vagina: Secondary | ICD-10-CM | POA: Insufficient documentation

## 2021-12-19 DIAGNOSIS — Z113 Encounter for screening for infections with a predominantly sexual mode of transmission: Secondary | ICD-10-CM

## 2021-12-19 NOTE — Progress Notes (Signed)
Subjective:        Julia Fowler is a 36 y.o. female here for a routine exam.  Current complaints: Vaginal discharge.    Personal health questionnaire:  Is patient Ashkenazi Jewish, have a family history of breast and/or ovarian cancer: no Is there a family history of uterine cancer diagnosed at age < 56, gastrointestinal cancer, urinary tract cancer, family member who is a Personnel officer syndrome-associated carrier: no Is the patient overweight and hypertensive, family history of diabetes, personal history of gestational diabetes, preeclampsia or PCOS: yes Is patient over 34, have PCOS,  family history of premature CHD under age 23, diabetes, smoke, have hypertension or peripheral artery disease:  no At any time, has a partner hit, kicked or otherwise hurt or frightened you?: no Over the past 2 weeks, have you felt down, depressed or hopeless?: no Over the past 2 weeks, have you felt little interest or pleasure in doing things?:no   Gynecologic History No LMP recorded. Patient has had an implant. Contraception: Nexplanon Last Pap: 2021. Results were: normal Last mammogram: n/a. Results were: n/a  Obstetric History OB History  Gravida Para Term Preterm AB Living  4 3 3  0 1 4  SAB IAB Ectopic Multiple Live Births  0 1 0 1 4    # Outcome Date GA Lbr Len/2nd Weight Sex Delivery Anes PTL Lv  4A Term 11/23/15 [redacted]w[redacted]d  6 lb 2.9 oz (2.805 kg) F CS-LTranv Spinal  LIV     Birth Comments: No gross abnormalities seen.  4B Term 11/23/15 [redacted]w[redacted]d  4 lb 11.1 oz (2.13 kg) M CS-LTranv Spinal  LIV     Birth Comments: No gross abnormalities seen.  3 Term 01/21/14 [redacted]w[redacted]d  7 lb 8.1 oz (3.405 kg) F CS-LTranv Spinal  LIV  2 IAB 06/27/09 [redacted]w[redacted]d     Other  DEC     Birth Comments: abortion  1 Term 10/20/00 [redacted]w[redacted]d  7 lb 7 oz (3.374 kg) M CS-Unspec EPI N LIV     Birth Comments: Did not progress past 3 cm    Past Medical History:  Diagnosis Date   Hypothyroidism    no medication   Rickets    Seasonal allergies     Thyroid disease hypothyroid    Past Surgical History:  Procedure Laterality Date   ARTHROSCOPY KNEE W/ DRILLING Right 2018   CESAREAN SECTION     CESAREAN SECTION N/A 01/21/2014   Procedure: REPEAT CESAREAN SECTION;  Surgeon: 01/23/2014, MD;  Location: [facility];  Service: Obstetrics;  Laterality: N/A;   CESAREAN SECTION MULTI-GESTATIONAL N/A 11/23/2015   Procedure: CESAREAN SECTION MULTI-GESTATIONAL/TWINS;  Surgeon: 11/25/2015, MD;  Location: [facility]IR[facility]ING SUIT[facility];  Service: Obstetrics;  Laterality: N/A;   TOOTH EXTRACTION Left      Current Outpatient Medications:    etonogestrel (NEXPLANON) 68 MG IMPL implant, 1 each by Subdermal route once., Disp: , Rfl:    ibuprofen (ADVIL) 800 MG tablet, Take 1 tablet (800 mg total) by mouth every 8 (eight) hours as needed., Disp: 30 tablet, Rfl: 5   levothyroxine (SYNTHROID) 75 MCG tablet, Take 1 tablet daily., Disp: 30 tablet, Rfl: 5   norethindrone (AYGESTIN) 5 MG tablet, TAKE 1 TABLET (5 MG TOTAL) BY MOUTH DAILY., Disp: 30 tablet, Rfl: 1   clindamycin (CLEOCIN) 300 MG capsule, Take 1 capsule (300 mg total) by mouth 3 (three) times daily. (Patient not taking: Reported on 05/18/2021), Disp: 21 capsule, Rfl: 0   nitrofurantoin, macrocrystal-monohydrate, (MACROBID) 100 MG  capsule, Take 1 capsule (100 mg total) by mouth 2 (two) times daily. 1 po BID x 7days (Patient not taking: Reported on 05/18/2021), Disp: 14 capsule, Rfl: 2   ondansetron (ZOFRAN ODT) 4 MG disintegrating tablet, Take 1 tablet (4 mg total) by mouth every 6 (six) hours as needed for nausea. (Patient not taking: Reported on 08/24/2021), Disp: 20 tablet, Rfl: 0   oxyCODONE-acetaminophen (PERCOCET/ROXICET) 5-325 MG tablet, Take 1 tablet by mouth every 4 (four) hours as needed for severe pain. (Patient not taking: Reported on 05/18/2021), Disp: 20 tablet, Rfl: 0   tamsulosin (FLOMAX) 0.4 MG CAPS capsule, Take 1 capsule (0.4 mg total) by mouth daily. (Patient not taking: Reported on  08/24/2021), Disp: 30 capsule, Rfl: 2   terconazole (TERAZOL 3) 0.8 % vaginal cream, Place 1 applicator vaginally at bedtime. (Patient not taking: Reported on 08/24/2021), Disp: 20 g, Rfl: 2   traMADol (ULTRAM) 50 MG tablet, Take 50 mg by mouth daily as needed., Disp: , Rfl:    zolpidem (AMBIEN) 5 MG tablet, Take 1 tablet (5 mg total) by mouth at bedtime as needed for sleep. (Patient not taking: Reported on 07/13/2020), Disp: 15 tablet, Rfl: 1 Allergies  Allergen Reactions   Other Other (See Comments) and Cough    Pt states that she is allergic to cats/dogs.   Reaction:  Itchy, watery eyes     Social History   Tobacco Use   Smoking status: Never   Smokeless tobacco: Never  Substance Use Topics   Alcohol use: Yes    Comment: occ wine    Family History  Problem Relation Age of Onset   Heart attack Mother    Cancer Sister        breast   Diabetes Maternal Grandmother       Review of Systems  Constitutional: negative for fatigue and weight loss Respiratory: negative for cough and wheezing Cardiovascular: negative for chest pain, fatigue and palpitations Gastrointestinal: negative for abdominal pain and change in bowel habits Musculoskeletal:negative for myalgias Neurological: negative for gait problems and tremors Behavioral/Psych: negative for abusive relationship, depression Endocrine: negative for temperature intolerance    Genitourinary: positive for vaginal discharge.  negative for abnormal menstrual periods, genital lesions, hot flashes, sexual problems Integument/breast: negative for breast lump, breast tenderness, nipple discharge and skin lesion(s)    Objective:       BP 121/83   Pulse 83   Ht 4\' 11"  (1.499 m)   Wt 243 lb 12.8 oz (110.6 kg)   BMI 49.24 kg/m  General:   Alert and no distress  Skin:   no rash or abnormalities  Lungs:   clear to auscultation bilaterally  Heart:   regular rate and rhythm, S1, S2 normal, no murmur, click, rub or gallop  Breasts:    normal without suspicious masses, skin or nipple changes or axillary nodes  Abdomen:  normal findings: no organomegaly, soft, non-tender and no hernia  Pelvis:  External genitalia: normal general appearance Urinary system: urethral meatus normal and bladder without fullness, nontender Vaginal: normal without tenderness, induration or masses Cervix: normal appearance Adnexa: normal bimanual exam Uterus: anteverted and non-tender, normal size   Lab Review Urine pregnancy test Labs reviewed yes Radiologic studies reviewed yes  I have spent a total of 20 minutes of face-to-face time, excluding clinical staff time, reviewing notes and preparing to see patient, ordering tests and/or medications, and counseling the patient.   Assessment:    1. Encounter for routine gynecological examination with Papanicolaou smear  of cervix Rx: - Cytology - PAP( [place])  2. Vaginal discharge Rx: - Cervicovaginal ancillary only( [place])  3. Screening examination for STD (sexually transmitted disease) Rx: - Hepatitis B Surface AntiGEN - Hepatitis C Antibody - RPR - HIV Antibody (routine testing w rflx)  4. Encounter for surveillance of Nexplanon subdermal contraceptive - pleased with Nexplanon  5. Class 3 severe obesity without serious comorbidity with body mass index (BMI) of 45.0 to 49.9 in adult, unspecified obesity type (HCC) - weight reduction with the aid of dietary changes, exercise and behavioral modification recommended     Plan:    Education reviewed: calcium supplements, depression evaluation, low fat, low cholesterol diet, safe sex/STD prevention, self breast exams, and weight bearing exercise. Contraception: Nexplanon. Follow up in: 1 year.    Orders Placed This Encounter  Procedures   Hepatitis B Surface AntiGEN   Hepatitis C Antibody   RPR   HIV Antibody (routine testing w rflx)      Brock Bad, MD 12/19/2021 1:58 PM

## 2021-12-19 NOTE — Progress Notes (Signed)
Pt presents for AEX. Last Pap: 07/28/19- normal Contraception: Nexplanon placed 2020 or 2021 & OCPs for bleeding. Urinary/Vaginal Symptoms: Denies vaginal discharge, odor, itching or irritation. STD screen: Desires STD screen, blood work.

## 2021-12-20 LAB — CERVICOVAGINAL ANCILLARY ONLY
Bacterial Vaginitis (gardnerella): NEGATIVE
Candida Glabrata: NEGATIVE
Candida Vaginitis: NEGATIVE
Chlamydia: NEGATIVE
Comment: NEGATIVE
Comment: NEGATIVE
Comment: NEGATIVE
Comment: NEGATIVE
Comment: NEGATIVE
Comment: NORMAL
Neisseria Gonorrhea: NEGATIVE
Trichomonas: NEGATIVE

## 2021-12-20 LAB — HEPATITIS C ANTIBODY: Hep C Virus Ab: NONREACTIVE

## 2021-12-20 LAB — HIV ANTIBODY (ROUTINE TESTING W REFLEX): HIV Screen 4th Generation wRfx: NONREACTIVE

## 2021-12-20 LAB — HEPATITIS B SURFACE ANTIGEN: Hepatitis B Surface Ag: NEGATIVE

## 2021-12-20 LAB — RPR: RPR Ser Ql: NONREACTIVE

## 2021-12-25 LAB — CYTOLOGY - PAP
Adequacy: ABSENT
Comment: NEGATIVE
Diagnosis: NEGATIVE
High risk HPV: NEGATIVE

## 2022-01-02 ENCOUNTER — Ambulatory Visit (INDEPENDENT_AMBULATORY_CARE_PROVIDER_SITE_OTHER): Payer: Commercial Managed Care - HMO | Admitting: "Endocrinology

## 2022-01-03 ENCOUNTER — Other Ambulatory Visit: Payer: Self-pay | Admitting: Obstetrics

## 2022-01-03 DIAGNOSIS — N939 Abnormal uterine and vaginal bleeding, unspecified: Secondary | ICD-10-CM

## 2022-01-08 ENCOUNTER — Other Ambulatory Visit (INDEPENDENT_AMBULATORY_CARE_PROVIDER_SITE_OTHER): Payer: Self-pay

## 2022-01-08 ENCOUNTER — Other Ambulatory Visit: Payer: Self-pay

## 2022-01-08 DIAGNOSIS — E063 Autoimmune thyroiditis: Secondary | ICD-10-CM

## 2022-01-08 DIAGNOSIS — N939 Abnormal uterine and vaginal bleeding, unspecified: Secondary | ICD-10-CM

## 2022-01-08 MED ORDER — LEVOTHYROXINE SODIUM 75 MCG PO TABS: ORAL_TABLET | ORAL | 5 refills | Status: DC

## 2022-01-08 MED ORDER — NORETHINDRONE ACETATE 5 MG PO TABS: 5.0000 mg | ORAL_TABLET | Freq: Every day | ORAL | 1 refills | Status: AC

## 2022-01-29 ENCOUNTER — Ambulatory Visit (INDEPENDENT_AMBULATORY_CARE_PROVIDER_SITE_OTHER): Payer: Commercial Managed Care - HMO | Admitting: "Endocrinology

## 2022-01-29 ENCOUNTER — Encounter (INDEPENDENT_AMBULATORY_CARE_PROVIDER_SITE_OTHER): Payer: Self-pay | Admitting: "Endocrinology

## 2022-01-29 VITALS — BP 120/76 | HR 80 | Wt 243.8 lb

## 2022-01-29 DIAGNOSIS — I10 Essential (primary) hypertension: Secondary | ICD-10-CM | POA: Diagnosis not present

## 2022-01-29 DIAGNOSIS — E063 Autoimmune thyroiditis: Secondary | ICD-10-CM | POA: Diagnosis not present

## 2022-01-29 DIAGNOSIS — E049 Nontoxic goiter, unspecified: Secondary | ICD-10-CM

## 2022-01-29 DIAGNOSIS — M9080 Osteopathy in diseases classified elsewhere, unspecified site: Secondary | ICD-10-CM

## 2022-01-29 NOTE — Progress Notes (Signed)
Subjective:  Patient Name: Julia Fowler Date of Birth: [birth year]  MRN: [id number]  Julia Fowler  presents to the office today for follow up evaluation and management of poorly controlled  X-linked hypophosphatemic rickets, acquired primary hypothyroidism, goiter, thyroiditis, morbid obesity.   HISTORY OF PRESENT ILLNESS:   Julia Fowler is a 36 y.o. African-American woman.Julia Fowler was accompanied by her son, Julia Fowler.  1. Julia Fowler had her initial endocrine consultation on 09/28/20:  A. XLHR:   1). 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 She was treated with phosphorus, calcitriol, and vitamin D.   2)aortic stenosis an adult she stopped taking the above medications but has taken a MVI.    3). She continues to have pains in her knees and back, and sometimes in her elbows and hands.    4). She has had several fractures including her right ankle and both knees. She also tore the meniscus of her right knee and had arthroscopy.     5). Her daughter is on Crysvita treatment now and Julia Fowler wants to try it.    B. Pertinent past medical history:   1). Medical problems:    A). Acquired hypothyroidism: She never had thyroid surgery, radioactive iodine , or went on a prolonged low-iodine diet.     B). Obesity   2). Surgeries: 3 cesarean sections, arthroscopy right knee   3). Allergies: No known medication allergies   4). GYN:G3,P3; She now has a Nexplanon implant.   5). Medications: Levothyroxine, 75 mcg/day; Nexplanon implant; Tramadol; Ambien  C. Pertinent family history   1). XLHR: son, daughter father,and twin sister   2). Thyroid disease: Son has goiter.   3). Obesity: Her mother and maternal aunts   4). DM in paternal grandmother.    5). ASCVD: None   6). Cancer: Older sister has breast cancer   7). None  2. Julia Fowler' last Endocrine Clinic visit occurred on 08/24/21. I ordered lab tests in preparation for starting her on Crysvita.   A. In the interim she has been fairly healthy.   B. She  started taking the Crysvita two months ago. She takes 30 mg every month.   C. She is now having some pain in her right shoulder, but no other adverse effects. She has more strength and stamina. She has less achiness in her arms and legs. She still has pains in her knees as she has had for years. She still has nocturia 2-3 x due to chronic urology issues.    D. Her PCP increased her levothyroxine dose to 88 mcg/day within the past week. E.. She is not taking any phosphorus, calcium, or vitamin D.  3. Pertinent Review of Systems:  Constitutional: The patient feels less tired, more awake, and more alert. Her body aches are much less. She still has difficulty sleeping,but now due to nocturia, not due to pains. due to her many pains.  Eyes: Vision is good. There are no significant eye complaints. Neck: The patient has frequent complaints of anterior neck discomfort/pain, but no difficulty swallowing.  Heart: Heart rate sometimes increases on its own.  The patient has no complaints of palpitations, irregular heat beats, chest pain, or chest pressure. Gastrointestinal: She has diarrhea about every other month. Bowel movents seem normal otherwise. The patient has no complaints of excessive hunger, acid reflux, upset stomach, stomach aches or pains. Hands and arms: Arms are frequently are stronger and much less painful.   Legs: Right knee hurts more than the left.  Ankles are much less sore. She has a sensation of numbness of her entire right leg. Muscle mass and strength seem normal. There are no other complaints of numbness, tingling, burning, or pain. No edema is noted. Feet: There are no obvious foot problems. There are no complaints of numbness, tingling, burning, or pain. No edema is noted. GYN: Nexplanon implant is in place. Periods are irregular.    PAST MEDICAL, FAMILY, AND SOCIAL HISTORY:  Past Medical History:  Diagnosis Date   Hypothyroidism    no medication   Rickets    Seasonal allergies     Thyroid disease hypothyroid    Family History  Problem Relation Age of Onset   Heart attack Mother    Cancer Sister        breast   Diabetes Maternal Grandmother      Current Outpatient Medications:    Burosumab-twza (CRYSVITA [place]), Inject into the skin., Disp: , Rfl:    etonogestrel (NEXPLANON) 68 MG IMPL implant, 1 each by Subdermal route once., Disp: , Rfl:    ibuprofen (ADVIL) 800 MG tablet, Take 1 tablet (800 mg total) by mouth every 8 (eight) hours as needed., Disp: 30 tablet, Rfl: 5   levothyroxine (SYNTHROID) 88 MCG tablet, Take 88 mcg by mouth every morning., Disp: , Rfl:    norethindrone (AYGESTIN) 5 MG tablet, Take 1 tablet (5 mg total) by mouth daily., Disp: 30 tablet, Rfl: 1   traMADol (ULTRAM) 50 MG tablet, Take 50 mg by mouth daily as needed., Disp: , Rfl:    zolpidem (AMBIEN) 5 MG tablet, Take 1 tablet (5 mg total) by mouth at bedtime as needed for sleep., Disp: 15 tablet, Rfl: 1   cephALEXin (KEFLEX) 500 MG capsule, 1 capsule (Patient not taking: Reported on 01/29/2022), Disp: , Rfl:    clindamycin (CLEOCIN) 300 MG capsule, Take 1 capsule (300 mg total) by mouth 3 (three) times daily. (Patient not taking: Reported on 05/18/2021), Disp: 21 capsule, Rfl: 0   fluconazole (DIFLUCAN) 150 MG tablet, 1 tablet (Patient not taking: Reported on 01/29/2022), Disp: , Rfl:    nitrofurantoin, macrocrystal-monohydrate, (MACROBID) 100 MG capsule, Take 1 capsule (100 mg total) by mouth 2 (two) times daily. 1 po BID x 7days (Patient not taking: Reported on 05/18/2021), Disp: 14 capsule, Rfl: 2   ondansetron (ZOFRAN ODT) 4 MG disintegrating tablet, Take 1 tablet (4 mg total) by mouth every 6 (six) hours as needed for nausea. (Patient not taking: Reported on 08/24/2021), Disp: 20 tablet, Rfl: 0   tamsulosin (FLOMAX) 0.4 MG CAPS capsule, Take 1 capsule (0.4 mg total) by mouth daily. (Patient not taking: Reported on 08/24/2021), Disp: 30 capsule, Rfl: 2   terconazole (TERAZOL 3) 0.8 % vaginal  cream, Place 1 applicator vaginally at bedtime. 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 .  2. Activities: Work and family 3. Smoking, alcohol, or drugs: Occasional alcohol, no tobacco, no recreational drugs 4. Primary Care Provider: [facility]  REVIEW OF SYSTEMS: There are no other significant problems involving Ivalene's other body systems.   Objective:  Vital Signs:  BP 120/76   Pulse 80   Wt 243 lb 12.8 oz (110.6 kg)   BMI 49.24 kg/m    Ht Readings  from Last 3 Encounters:  12/19/21 4\' 11"  (1.499 m)  01/05/20 4\' 11"  (1.499 m)  01/01/20 4\' 11"  (1.499 m)   Wt Readings from Last 3 Encounters:  01/29/22 243 lb 12.8 oz (110.6 kg)  12/19/21 243 lb 12.8 oz (110.6 kg)  08/24/21 238 lb 9.6 oz (108.2 kg)   HC Readings from Last 3 Encounters:  No data found for [facility]   Body surface area is 2.15 meters squared.  PHYSICAL EXAM:  Constitutional: The patient appears healthy, morbidly obese, and antalgic. She has gained 5 pounds in the past 5 months.She is alert and bright. Her affect and insight are normal.   Face: The face appears normal.  Eyes: There is no obvious arcus or proptosis. Moisture appears normal. Mouth: The oropharynx and tongue appear normal. Oral moisture is normal. Neck: The neck appears to be visibly normal. No carotid bruits are noted. The thyroid gland is again mildly enlarged at 21 grams in size. The consistency of the thyroid gland is somewhat full bilaterally. The thyroid gland is not tender to palpation today.  Lungs: The lungs are clear to auscultation. Air movement is good. Heart: Heart rate and rhythm are regular. Heart sounds S1 and S2 are normal. I did not appreciate any pathologic cardiac murmurs. Abdomen: The  abdomen is quite obese. Bowel sounds are normal. There is no obvious hepatomegaly, splenomegaly, or other mass effect.  Arms: Muscle size and bulk are normal for age. Hands: There is no obvious tremor. Phalangeal and metacarpophalangeal joints are normal. Palmar muscles are normal. Palmar skin is normal. Palmar moisture is also normal. Legs: Muscles appear normal for age. No edema is present. She no longer has pains in her shins. Neurologic: Strength is normal for age in both the upper and lower extremities. Muscle tone is normal. Sensation to touch is normal in both legs.    LAB DATA:  No results found for this or any previous visit (from the past 504 hour(s)).  Labs 08/25/21: TSH 2.23, free T4 1.3, free T3 3.0; CMP normal; PTH 45 (ref 16-77), calcium 9.1 (ref 8.6-10.2), 5-OH vitamin D 28, 1,25-dihydroxy vitamin D 42 (ref 18-72), phosphorus 2.2 (ref 2.5-4.5)  Labs 09/28/20: Phosphorus 2.1 (ref 2.5-4.5)   Assessment and Plan:   ASSESSMENT:  1. XLHR:  A. She has had this problem for many years. She wanted to start Crysvita.  B. Her lab tests in February 2023 showed a mildly low phosphorus and mildly low vitamin D.  C. She understands that she will have to return for frequent lab tests and close follow up when we proceed with Crysvita treatment.  D. Since beginning Crysvita she has been feeling better. She has not had any adverse problems.  2. Hypothyroid, acquired, autoimmune/goiter:  A. She continues to have episodic painful thyroiditis, c/w evolving Hashimoto's disease.  B. She was clinically hypothyroid last week, so her PCP increased her levothyroxine dose. . 3. Morbid obesity: This problem has large genetic component. She continues to take in more calories than she burns off.  4. 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 5. Right knee pain, chronic: This problem is mostly due to her previous knee injuries and surgery on the right knee.    6. Hypertension: Her BP is normal today.     PLAN:  1. Diagnostic: PTH, calcium, CMP, phosphorus, 25-OH vitamin D, calcitriol 2. Therapeutic: I will adjust her Crysvita dose based upon her new phosphorus level.  3.  Patient education: We reviewed the advantages and disadvantages of Crysvita treatment. She is very motivated to continue with treatment. 4. Follow-up: 2 months, 03/29/22 at 3:45 PM.  Level of Service: This visit lasted in excess of 50 minutes. More than 50% of the visit was devoted to counseling.  Molli Knock, MD, CDE Adult and Pediatric Endocrinology

## 2022-01-29 NOTE — Patient Instructions (Signed)
Follow up visit on 03/29/22 at 3:45 PM.  At [facility], we are committed to providing exceptional care. You will receive a patient satisfaction survey through text or email regarding your visit today. Your opinion is important to me. Comments are appreciated.

## 2022-02-02 ENCOUNTER — Other Ambulatory Visit: Payer: Self-pay | Admitting: Obstetrics and Gynecology

## 2022-02-02 DIAGNOSIS — N939 Abnormal uterine and vaginal bleeding, unspecified: Secondary | ICD-10-CM

## 2022-02-08 ENCOUNTER — Encounter (INDEPENDENT_AMBULATORY_CARE_PROVIDER_SITE_OTHER): Payer: Self-pay

## 2022-03-28 NOTE — Progress Notes (Signed)
Subjective:  Patient Name: Julia Fowler Date of Birth: [birth year]  MRN: [id number]  Julia Fowler  presents to the office today for follow up evaluation and management of poorly controlled  X-linked hypophosphatemic rickets, acquired primary hypothyroidism, goiter, thyroiditis, morbid obesity.   HISTORY OF PRESENT ILLNESS:   Julia Fowler is a 36 y.o. African-American woman.Julia Fowler was accompanied by her son, Julia Fowler.  1. Julia Fowler had her initial endocrine consultation on 09/28/20:  A. XLHR:   1). 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 Julia Fowler was treated with phosphorus, calcitriol, and vitamin D.   2). [Addendum 04/12/22: At her initial visit with me on 09/28/20, there was a note in her PMH that indicated that Julia Fowler had stopped taking her rickets medications as an adult due to aortic stenosis.  I have performed a very detailed review of her records in [facility], to include her imaging records, and could not find any note about her ever having had a cardiology evaluation or an ECHOcardiogram. Tonight I called her. Julia Fowler confirmed that no one has ever told her Julia Fowler has a problem with her heart or blood vessels. Julia Fowler can't remember ever having had a cardiology evaluation or an ECHO. Julia Fowler says the reason Julia Fowler discontinued taking the rickets medications was that taking them was so much of a hassle.]   3). Julia Fowler continues to have pains in her knees and back, and sometimes in her elbows and hands.    4). Julia Fowler has had several fractures including her right ankle and both knees. Julia Fowler also tore the meniscus of her right knee and had arthroscopy.     5). Her daughter is on Crysvita treatment now and Ms. Wieser wants to try it.    B. Pertinent past medical history:   1). Medical problems:    A). Acquired hypothyroidism: Julia Fowler never had thyroid surgery, radioactive iodine , or went on a prolonged low-iodine diet.     B). Obesity   2). Surgeries: 3 cesarean sections, arthroscopy right knee   3). Allergies: No known medication  allergies   4). GYN:G3,P3; Julia Fowler now has a Nexplanon implant.   5). Medications: Levothyroxine, 75 mcg/day; Nexplanon implant; Tramadol; Ambien  C. Pertinent family history   1). XLHR: son, daughter father,and twin sister   2). Thyroid disease: Son has goiter.   3). Obesity: Her mother and maternal aunts   4). DM in paternal grandmother.    5). ASCVD: None   6). Cancer: Older sister has breast cancer   7). None  2. Julia Fowler' last Endocrine Clinic visit occurred on 01/29/22.   A. In the interim Julia Fowler has been fairly healthy.   B. Julia Fowler started taking the Crysvita in about April 2023. Julia Fowler takes 30 mg every month.   C. Julia Fowler is now having much less pain in her right shoulder, but no other adverse effects. Julia Fowler has more strength and stamina. Julia Fowler has less achiness in her arms and left leg, but still has some sciatica in the right leg. Julia Fowler still has pains in her right knee, but the left knee is okay. Julia Fowler still has nocturia 2-3 x due to chronic urology issues.    D. Her PCP increased her levothyroxine dose to 88 mcg/day in July 2023.  E. Julia Fowler is not taking any phosphorus, calcium, or vitamin D. F. Julia Fowler is eating more carefully, eating less bread, and consuming fewer sodas.   3. Pertinent Review of Systems:  Constitutional: The patient feels better, but very tired. Julia Fowler is less alert.  Her body aches persist. Julia Fowler still has difficulty sleeping,but now due to nocturia, not due to pains. due to her many pains.  Eyes: Vision is good. There are no significant eye complaints. Neck: The patient has frequent complaints of anterior neck discomfort/pain, but no difficulty swallowing.  Heart: Heart rate sometimes increases on its own, some of which may be caffeine-related.  Julia Fowler patient has no complaints of palpitations, irregular heat beats, chest pain, or chest pressure. Gastrointestinal: Julia Fowler has diarrhea about every other month. Bowel movents seem normal otherwise. The patient has no complaints of excessive hunger, acid  reflux, upset stomach, stomach aches or pains. Hands and arms: Arms are frequently are stronger and less painful since starting Crysvita. .   Legs: Right knee hurts more than the left.  Ankles are much less sore. Julia Fowler has a sensation of numbness of her entire right leg. Muscle mass and strength seem normal. There are no other complaints of numbness, tingling, burning, or pain. No edema is noted. Feet: There are no obvious foot problems. There are no complaints of numbness, tingling, burning, or pain. No edema is noted. GYN: Nexplanon implant is in place. Periods are irregular.    PAST MEDICAL, FAMILY, AND SOCIAL HISTORY:  Past Medical History:  Diagnosis Date   Hypothyroidism    no medication   Rickets    Seasonal allergies    Thyroid disease hypothyroid    Family History  Problem Relation Age of Onset   Heart attack Mother    Cancer Sister        breast   Diabetes Maternal Grandmother      Current Outpatient Medications:    Burosumab-twza (CRYSVITA [place]), Inject into the skin., Disp: , Rfl:    levothyroxine (SYNTHROID) 88 MCG tablet, Take 88 mcg by mouth every morning., Disp: , Rfl:    cephALEXin (KEFLEX) 500 MG capsule, 1 capsule (Patient not taking: Reported on 01/29/2022), Disp: , Rfl:    clindamycin (CLEOCIN) 300 MG capsule, Take 1 capsule (300 mg total) by mouth 3 (three) times daily. (Patient not taking: Reported on 05/18/2021), Disp: 21 capsule, Rfl: 0   etonogestrel (NEXPLANON) 68 MG IMPL implant, 1 each by Subdermal route once. (Patient not taking: Reported on 03/29/2022), Disp: , Rfl:    fluconazole (DIFLUCAN) 150 MG tablet, 1 tablet (Patient not taking: Reported on 01/29/2022), Disp: , Rfl:    ibuprofen (ADVIL) 800 MG tablet, Take 1 tablet (800 mg total) by mouth every 8 (eight) hours as needed. (Patient not taking: Reported on 03/29/2022), Disp: 30 tablet, Rfl: 5   nitrofurantoin, macrocrystal-monohydrate, (MACROBID) 100 MG capsule, Take 1 capsule (100 mg total) by mouth 2  (two) times daily. 1 po BID x 7days (Patient not taking: Reported on 05/18/2021), Disp: 14 capsule, Rfl: 2   norethindrone (AYGESTIN) 5 MG tablet, Take 1 tablet (5 mg total) by mouth daily. (Patient not taking: Reported on 03/29/2022), Disp: 30 tablet, Rfl: 1   ondansetron (ZOFRAN ODT) 4 MG disintegrating tablet, Take 1 tablet (4 mg total) by mouth every 6 (six) hours as needed for nausea. (Patient not taking: Reported on 08/24/2021), Disp: 20 tablet, Rfl: 0   tamsulosin (FLOMAX) 0.4 MG CAPS capsule, Take 1 capsule (0.4 mg total) by mouth daily. (Patient not taking: Reported on 08/24/2021), Disp: 30 capsule, Rfl: 2   terconazole (TERAZOL 3) 0.8 % vaginal cream, Place 1 applicator vaginally at bedtime. (Patient not taking: Reported on 08/24/2021), Disp: 20 g, Rfl: 2   traMADol (ULTRAM) 50 MG tablet, Take 50  mg by mouth daily as needed. (Patient not taking: Reported on 03/29/2022), Disp: , Rfl:    zolpidem (AMBIEN) 5 MG tablet, Take 1 tablet (5 mg total) by mouth at bedtime as needed for sleep. (Patient not taking: Reported on 03/29/2022), Disp: 15 tablet, Rfl: 1  Allergies as of 03/29/2022 - Review Complete 03/29/2022  Allergen Reaction Noted   Other Other (See Comments) and Cough 07/14/2013    1. 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 Julia Fowler has two of her three kids living at home. .  2. Activities: Work and family 3. Smoking, alcohol, or drugs: Occasional alcohol, no tobacco, no recreational drugs 4. Primary Care Provider: [facility]  REVIEW OF SYSTEMS: There are no other significant problems involving Julia Fowler's other body systems.   Objective:  Vital Signs:  BP 122/80   Pulse 75   Wt 238 lb 9.6 oz (108.2 kg)   BMI 48.19 kg/m    Ht Readings from Last 3 Encounters:  12/19/21 4\' 11"  (1.499 m)  01/05/20 4\' 11"  (1.499 m)  01/01/20 4\' 11"  (1.499 m)   Wt Readings from Last 3 Encounters:  03/29/22 238 lb 9.6 oz (108.2 kg)  01/29/22 243 lb 12.8 oz (110.6 kg)  12/19/21 243 lb 12.8 oz (110.6  kg)   HC Readings from Last 3 Encounters:  No data found for [facility]   Body surface area is 2.12 meters squared.  PHYSICAL EXAM:  Constitutional: The patient appears healthy, morbidly obese, and antalgic. Julia Fowler has lost 5 pounds in the past 2 months.Julia Fowler is alert and bright. Her affect and insight are normal.   Face: The face appears normal.  Eyes: There is no obvious arcus or proptosis. Moisture appears normal. Mouth: The oropharynx and tongue appear normal. Oral moisture is normal. Neck: The neck appears to be visibly normal. No carotid bruits are noted. The thyroid gland is again mildly enlarged at 21 grams in size. The consistency of the thyroid gland is somewhat full bilaterally. The thyroid gland is tender to palpation bilaterally today, worse in the right mid-lobe.  Lungs: The lungs are clear to auscultation. Air movement is good. Heart: Heart rate and rhythm are regular. Heart sounds S1 and S2 are normal. I did not appreciate any pathologic cardiac murmurs. Abdomen: The abdomen is quite obese. Bowel sounds are normal. There is no obvious hepatomegaly, splenomegaly, or other mass effect.  Arms: Muscle size and bulk are normal for age. Hands: There is no obvious tremor. Phalangeal and metacarpophalangeal joints are normal. Palmar muscles are normal. Palmar skin is normal. Palmar moisture is also normal. Legs: Muscles appear normal for age. No edema is present. Julia Fowler no longer has pains in her shins. Neurologic: Strength is normal for age in both the upper and lower extremities. Muscle tone is normal. Sensation to touch is normal in both legs.    LAB DATA:  Results for orders placed or performed in visit on 01/29/22 (from the past 504 hour(s))  Comprehensive metabolic panel   Collection Time: 03/27/22  2:08 PM  Result Value Ref Range   Glucose, Bld 82 65 - 139 mg/dL   BUN 17 7 - 25 mg/dL   Creat 0.74 0.50 - 0.97 mg/dL   BUN/Creatinine Ratio SEE NOTE: 6 - 22 (calc)   Sodium 139 135 - 146  mmol/L   Potassium 3.8 3.5 - 5.3 mmol/L   Chloride 106 98 - 110 mmol/L   CO2 21 20 - 32 mmol/L   Calcium 8.8 8.6 - 10.2 mg/dL  Total Protein 6.8 6.1 - 8.1 g/dL   Albumin 3.6 3.6 - 5.1 g/dL   Globulin 3.2 1.9 - 3.7 g/dL (calc)   AG Ratio 1.1 1.0 - 2.5 (calc)   Total Bilirubin 0.3 0.2 - 1.2 mg/dL   Alkaline phosphatase (APISO) 81 31 - 125 U/L   AST 22 10 - 30 U/L   ALT 22 6 - 29 U/L  PTH, intact and calcium   Collection Time: 03/27/22  2:08 PM  Result Value Ref Range   PTH 95 (H) 16 - 77 pg/mL   Calcium 8.8 8.6 - 10.2 mg/dL  VITAMIN D 25 Hydroxy (Vit-D Deficiency, Fractures)   Collection Time: 03/27/22  2:08 PM  Result Value Ref Range   Vit D, 25-Hydroxy 24 (L) 30 - 100 ng/mL  Phosphorus   Collection Time: 03/27/22  2:08 PM  Result Value Ref Range   Phosphorus 2.2 (L) 2.5 - 4.5 mg/dL   Labs 1/61/099/19/23: CMP normal; PTH 95 (ref 16-77), calcium 8.8 (ref 8.6-10.2), phosphorus 2.2 (ref 2.5-4.5), 25-OH vitamin D 24, calcitriol in process  Labs 03/07/22: TSH 2.63, free T4 0.92, TPO antibody 121 (ref 0-34)  Labs 08/25/21: TSH 2.23, free T4 1.3, free T3 3.0; CMP normal; PTH 45 (ref 16-77), calcium 9.1 (ref 8.6-10.2), 5-OH vitamin D 28, 1,25-dihydroxy vitamin D 42 (ref 18-72), phosphorus 2.2 (ref 2.5-4.5)  Labs 09/28/20: Phosphorus 2.1 (ref 2.5-4.5)   Assessment and Plan:   ASSESSMENT:  1. XLHR:  A. Julia Fowler has had this problem for many years. Julia Fowler wanted to start Crysvita.  B. Her lab tests in February 2023 showed a mildly low phosphorus and mildly low vitamin D.  C. Her lab tests in September 2023 still showed a mildly low phosphorus and mildly lower vitamin D. Her PTH was higher and her Calcium was still borderline low.  C. Julia Fowler understands that Julia Fowler will have to return for frequent lab tests and close follow up when we proceed with Crysvita treatment. Julia Fowler needs to increase her Crysvita now. D. Since beginning Crysvita Julia Fowler has been feeling better. Julia Fowler has not had any adverse problems. Her lab  parameters have not improved very much.  2. Hypothyroid, acquired, autoimmune/goiter:  A. Julia Fowler continues to have episodic painful thyroiditis, c/w evolving Hashimoto's disease.  B. Her lab tests in August were within the reference range, but not optimal. I would like her to maintain her TSH in the 1.0-2.0 goal range as much as possible. Julia Fowler is clinically hypothyroid again today.  3. Morbid obesity: This problem has large genetic component. Julia Fowler has lost weight recently due to changing her diet.  4. Back pain, low back with sciatica: This problem is chronic and may be aggravated by her leg deformities that were caused by her XLHR. 5. Right knee pain, chronic: This problem is mostly due to her previous knee injuries and surgery on the right knee.   6. Hypertension: Her DBP is high-normal today.    7. Secondary hypoparathyroid, low vitamin D and low-normal calcium: Julia Fowler needs small increases in vitamin D and calcium doses.   PLAN:  1. Diagnostic: PTH, calcium, CMP, phosphorus, 25-OH vitamin D in one month.  PTH, calcium, phosphorus, and 25-OH vitamin D, plus TSH, free T4, and free T3 in two months,  2. Therapeutic: Increase her Crysvita dose to 40 mg, [place], every 4 weeks.  Increase her levothyroxine to 100 mcg/day. Take one 500 mg Citracal tablet per day  3. Patient education: We reviewed the advantages and disadvantages of Crysvita treatment. Julia Fowler  is very motivated to continue with treatment. We also discussed her TFTs. 4. Follow-up: 2 months with Dr. Lonzo Cloud in [facility] endocrinology.   Level of Service: This visit lasted in excess of 60 minutes. More than 50% of the visit was devoted to counseling.  Molli Knock, MD, CDE Adult and Pediatric Endocrinology

## 2022-03-29 ENCOUNTER — Ambulatory Visit (INDEPENDENT_AMBULATORY_CARE_PROVIDER_SITE_OTHER): Payer: Commercial Managed Care - HMO | Admitting: "Endocrinology

## 2022-03-29 ENCOUNTER — Encounter (INDEPENDENT_AMBULATORY_CARE_PROVIDER_SITE_OTHER): Payer: Self-pay | Admitting: "Endocrinology

## 2022-03-29 DIAGNOSIS — E211 Secondary hyperparathyroidism, not elsewhere classified: Secondary | ICD-10-CM

## 2022-03-29 DIAGNOSIS — I10 Essential (primary) hypertension: Secondary | ICD-10-CM | POA: Diagnosis not present

## 2022-03-29 DIAGNOSIS — E063 Autoimmune thyroiditis: Secondary | ICD-10-CM | POA: Diagnosis not present

## 2022-03-29 DIAGNOSIS — M9080 Osteopathy in diseases classified elsewhere, unspecified site: Secondary | ICD-10-CM

## 2022-03-29 DIAGNOSIS — E559 Vitamin D deficiency, unspecified: Secondary | ICD-10-CM

## 2022-03-29 DIAGNOSIS — E049 Nontoxic goiter, unspecified: Secondary | ICD-10-CM

## 2022-03-29 MED ORDER — LEVOTHYROXINE SODIUM 100 MCG PO TABS: 100.0000 ug | ORAL_TABLET | Freq: Every day | ORAL | 5 refills | Status: AC

## 2022-03-29 NOTE — Patient Instructions (Signed)
No further follow up here. Please increase the Crysvita to 40 mg, subcutaneously, every 4 weeks. Please increase the levothyroxine to 100 mcg/day. Please take one Citracal-D 500 mg tablet daily. Please repeat lab tests in 4 and 8 weeks.  I submitted a consult to Dr. Kelton Pillar in [facility].   [facility] we are committed to providing exceptional care. You will receive a patient satisfaction survey through text or email regarding your visit today. Your opinion is important to me. Comments are appreciated.

## 2022-04-01 LAB — COMPREHENSIVE METABOLIC PANEL
AG Ratio: 1.1 (calc) (ref 1.0–2.5)
ALT: 22 U/L (ref 6–29)
AST: 22 U/L (ref 10–30)
Albumin: 3.6 g/dL (ref 3.6–5.1)
Alkaline phosphatase (APISO): 81 U/L (ref 31–125)
BUN: 17 mg/dL (ref 7–25)
CO2: 21 mmol/L (ref 20–32)
Calcium: 8.8 mg/dL (ref 8.6–10.2)
Chloride: 106 mmol/L (ref 98–110)
Creat: 0.74 mg/dL (ref 0.50–0.97)
Globulin: 3.2 g/dL (calc) (ref 1.9–3.7)
Glucose, Bld: 82 mg/dL (ref 65–139)
Potassium: 3.8 mmol/L (ref 3.5–5.3)
Sodium: 139 mmol/L (ref 135–146)
Total Bilirubin: 0.3 mg/dL (ref 0.2–1.2)
Total Protein: 6.8 g/dL (ref 6.1–8.1)

## 2022-04-01 LAB — PTH, INTACT AND CALCIUM
Calcium: 8.8 mg/dL (ref 8.6–10.2)
PTH: 95 pg/mL — ABNORMAL HIGH (ref 16–77)

## 2022-04-01 LAB — VITAMIN D 1,25 DIHYDROXY
Vitamin D 1, 25 (OH)2 Total: 49 pg/mL (ref 18–72)
Vitamin D2 1, 25 (OH)2: <8 pg/mL
Vitamin D3 1, 25 (OH)2: 49 pg/mL

## 2022-04-01 LAB — PHOSPHORUS: Phosphorus: 2.2 mg/dL — ABNORMAL LOW (ref 2.5–4.5)

## 2022-04-01 LAB — VITAMIN D 25 HYDROXY (VIT D DEFICIENCY, FRACTURES): Vit D, 25-Hydroxy: 24 ng/mL — ABNORMAL LOW (ref 30–100)

## 2022-04-10 ENCOUNTER — Telehealth (INDEPENDENT_AMBULATORY_CARE_PROVIDER_SITE_OTHER): Payer: Self-pay | Admitting: "Endocrinology

## 2022-04-10 NOTE — Telephone Encounter (Signed)
  Name of who is calling: Denman George from [place] contact number: [phone]  Provider they see: Dr. Tobe Sos  Reason for call: Denman George is calling on behalf of Julia Fowler. [name] called to let her know her dose for tresiba increased but [employer] hasn't received any new RX for this.

## 2022-04-12 ENCOUNTER — Telehealth (INDEPENDENT_AMBULATORY_CARE_PROVIDER_SITE_OTHER): Payer: Self-pay | Admitting: "Endocrinology

## 2022-04-12 NOTE — Telephone Encounter (Signed)
I called Julia Fowler to clear up a possible discrepancy in her health record.  In my initial history at hr first visit with me on 09/28/20, there was a note in her PMH of having stopped her rickets medications as an adult due to aortic stenosis. I did not see any evidence of that issue in the records available to me in [facility].  At her last visit on 03/29/22, I did not follow up on that issue. Subsequently, however, after reviewing my encounter note, I realized that I should have followed up. I then performed a very detailed review of her records in [place], to include her imaging reports. I could not find any evidence of aortic stenosis or any cardiology evaluation.  I called her tonight to discuss this issue. She said that she does not remember ever having a cardiology evaluation or having an ECHOcardiogram done. No one has ever mentioned any heart or blood vessel problems to her.  I told her that I will amend my previous encounter notes to correct the record.  Tillman Sers, MD, CDCES

## 2022-05-01 ENCOUNTER — Telehealth: Payer: Commercial Managed Care - HMO | Admitting: Physician Assistant

## 2022-05-01 DIAGNOSIS — B9689 Other specified bacterial agents as the cause of diseases classified elsewhere: Secondary | ICD-10-CM

## 2022-05-01 DIAGNOSIS — N760 Acute vaginitis: Secondary | ICD-10-CM

## 2022-05-01 MED ORDER — FLUCONAZOLE 150 MG PO TABS: 150.0000 mg | ORAL_TABLET | Freq: Once | ORAL | 0 refills | Status: AC

## 2022-05-01 NOTE — Progress Notes (Signed)
I have spent 5 minutes in review of e-visit questionnaire, review and updating patient chart, medical decision making and response to patient.   Marvine Encalade Cody Augusten Lipkin, PA-C    

## 2022-05-01 NOTE — Progress Notes (Signed)

## 2022-05-18 ENCOUNTER — Other Ambulatory Visit: Payer: Self-pay | Admitting: Obstetrics

## 2022-05-18 DIAGNOSIS — R52 Pain, unspecified: Secondary | ICD-10-CM

## 2022-07-11 ENCOUNTER — Telehealth: Payer: Self-pay

## 2022-07-11 LAB — PTH, INTACT AND CALCIUM
Calcium: 8.9 mg/dL (ref 8.6–10.2)
PTH: 60 pg/mL (ref 16–77)

## 2022-07-11 LAB — VITAMIN D 25 HYDROXY (VIT D DEFICIENCY, FRACTURES): Vit D, 25-Hydroxy: 16 ng/mL — ABNORMAL LOW (ref 30–100)

## 2022-07-11 LAB — PHOSPHORUS: Phosphorus: 2.6 mg/dL (ref 2.5–4.5)

## 2022-07-11 LAB — TSH: TSH: 3.41 mIU/L

## 2022-07-11 LAB — T4, FREE: Free T4: 1.2 ng/dL (ref 0.8–1.8)

## 2022-07-11 LAB — T3, FREE: T3, Free: 3.1 pg/mL (ref 2.3–4.2)

## 2022-07-11 NOTE — Telephone Encounter (Signed)
Mychart msg sent. AS, CMA 

## 2022-07-26 ENCOUNTER — Other Ambulatory Visit (HOSPITAL_COMMUNITY): Payer: Self-pay

## 2022-07-26 MED ORDER — WEGOVY 0.25 MG/0.5ML ~~LOC~~ SOAJ
0.2500 mg | SUBCUTANEOUS | 0 refills | Status: AC
  Filled 2022-07-26 – 2022-07-30 (×2): qty 2, 28d supply, fill #0

## 2022-07-30 ENCOUNTER — Other Ambulatory Visit (HOSPITAL_COMMUNITY): Payer: Self-pay

## 2022-09-13 ENCOUNTER — Telehealth (INDEPENDENT_AMBULATORY_CARE_PROVIDER_SITE_OTHER): Payer: Self-pay | Admitting: "Endocrinology

## 2022-09-13 NOTE — Telephone Encounter (Signed)
Spoke with Julia Fowler. Let her know that legally we can not send in anything. We do not have an adult dr here.

## 2022-09-13 NOTE — Telephone Encounter (Signed)
Returned call to Denman George to let her know that Malahni is no longer a patient here and Dr. Tobe Sos is no longer with the practice.  Skylee will need to reach out to her PCP for the refill.  Letters were sent to patients prior to him leaving last fall.

## 2022-09-13 NOTE — Telephone Encounter (Signed)
Who's calling (name and relationship to patient) : Julia Fowler; [place] contact number: [phone]  Provider they see: Dr. Tobe Sos  Reason for call: Julia Fowler called and stated that [name] is completely out of refills for Crysvita. She stated that she also faxed over a request as well.

## 2022-09-13 NOTE — Telephone Encounter (Signed)
Who's calling (name and relationship to patient) : Chyrl Pomroy; mom   Best contact number: [phone]  Provider they see: Dr. Tobe Sos  Reason for call: Julia Fowler was calling in to have new order sent in. She stated that they([name]) can't be seen until June. She is needing the Crysvita in order to continue therapy.

## 2022-09-13 NOTE — Telephone Encounter (Signed)
Patient called this afternoon saying that she needs the medication filled.  Patient stated that there are no other endocrinologists  in the [facility] at [facility] office and she needs a prescription sent in.

## 2022-12-21 ENCOUNTER — Ambulatory Visit: Payer: Commercial Managed Care - HMO | Admitting: Internal Medicine

## 2022-12-25 ENCOUNTER — Other Ambulatory Visit: Payer: Self-pay | Admitting: Obstetrics

## 2022-12-25 ENCOUNTER — Encounter: Payer: Self-pay | Admitting: Obstetrics

## 2022-12-25 DIAGNOSIS — N946 Dysmenorrhea, unspecified: Secondary | ICD-10-CM

## 2022-12-25 MED ORDER — IBUPROFEN 800 MG PO TABS: 800.0000 mg | ORAL_TABLET | Freq: Three times a day (TID) | ORAL | 5 refills | Status: DC | PRN

## 2023-02-16 ENCOUNTER — Encounter (HOSPITAL_COMMUNITY): Payer: Self-pay

## 2023-02-16 ENCOUNTER — Other Ambulatory Visit: Payer: Self-pay

## 2023-02-16 ENCOUNTER — Emergency Department (HOSPITAL_COMMUNITY)
Admission: EM | Admit: 2023-02-16 | Discharge: 2023-02-16 | Disposition: A | Discharge status: Home / Self Care | Payer: Commercial Managed Care - HMO | Attending: Emergency Medicine | Admitting: Emergency Medicine

## 2023-02-16 ENCOUNTER — Emergency Department (HOSPITAL_COMMUNITY): Payer: Commercial Managed Care - HMO

## 2023-02-16 DIAGNOSIS — M791 Myalgia, unspecified site: Secondary | ICD-10-CM | POA: Insufficient documentation

## 2023-02-16 DIAGNOSIS — E039 Hypothyroidism, unspecified: Secondary | ICD-10-CM | POA: Insufficient documentation

## 2023-02-16 DIAGNOSIS — Z1152 Encounter for screening for COVID-19: Secondary | ICD-10-CM | POA: Insufficient documentation

## 2023-02-16 LAB — CBC WITH DIFFERENTIAL/PLATELET
Abs Immature Granulocytes: 0.03 10*3/uL (ref 0.00–0.07)
Basophils Absolute: 0.1 10*3/uL (ref 0.0–0.1)
Basophils Relative: 1 %
Eosinophils Absolute: 0.5 10*3/uL (ref 0.0–0.5)
Eosinophils Relative: 6 %
HCT: 38.6 % (ref 36.0–46.0)
Hemoglobin: 12.6 g/dL (ref 12.0–15.0)
Immature Granulocytes: 0 %
Lymphocytes Relative: 5 %
Lymphs Abs: 0.4 10*3/uL — ABNORMAL LOW (ref 0.7–4.0)
MCH: 30.3 pg (ref 26.0–34.0)
MCHC: 32.6 g/dL (ref 30.0–36.0)
MCV: 92.8 fL (ref 80.0–100.0)
Monocytes Absolute: 0.3 10*3/uL (ref 0.1–1.0)
Monocytes Relative: 5 %
Neutro Abs: 5.9 10*3/uL (ref 1.7–7.7)
Neutrophils Relative %: 83 %
Platelets: 300 10*3/uL (ref 150–400)
RBC: 4.16 MIL/uL (ref 3.87–5.11)
RDW: 12.3 % (ref 11.5–15.5)
WBC: 7.2 10*3/uL (ref 4.0–10.5)
nRBC: 0 % (ref 0.0–0.2)

## 2023-02-16 LAB — COMPREHENSIVE METABOLIC PANEL
ALT: 13 U/L (ref 0–44)
AST: 43 U/L — ABNORMAL HIGH (ref 15–41)
Albumin: 2.9 g/dL — ABNORMAL LOW (ref 3.5–5.0)
Alkaline Phosphatase: 63 U/L (ref 38–126)
Anion gap: 15 (ref 5–15)
BUN: 11 mg/dL (ref 6–20)
CO2: 19 mmol/L — ABNORMAL LOW (ref 22–32)
Calcium: 8.5 mg/dL — ABNORMAL LOW (ref 8.9–10.3)
Chloride: 98 mmol/L (ref 98–111)
Creatinine, Ser: 0.75 mg/dL (ref 0.44–1.00)
GFR, Estimated: >60 mL/min (ref >60)
Glucose, Bld: 89 mg/dL (ref 70–99)
Potassium: 5 mmol/L (ref 3.5–5.1)
Sodium: 132 mmol/L — ABNORMAL LOW (ref 135–145)
Total Bilirubin: 1.4 mg/dL — ABNORMAL HIGH (ref 0.3–1.2)
Total Protein: 6.5 g/dL (ref 6.5–8.1)

## 2023-02-16 LAB — GROUP A STREP BY PCR: Group A Strep by PCR: NOT DETECTED

## 2023-02-16 LAB — URINALYSIS, W/ REFLEX TO CULTURE (INFECTION SUSPECTED)
Bilirubin Urine: NEGATIVE
Glucose, UA: NEGATIVE mg/dL
Ketones, ur: NEGATIVE mg/dL
Leukocytes,Ua: NEGATIVE
Nitrite: NEGATIVE
Protein, ur: NEGATIVE mg/dL
Specific Gravity, Urine: 1.02 (ref 1.005–1.030)
pH: 5 (ref 5.0–8.0)

## 2023-02-16 LAB — SARS CORONAVIRUS 2 BY RT PCR: SARS Coronavirus 2 by RT PCR: NEGATIVE

## 2023-02-16 LAB — LIPASE, BLOOD: Lipase: 23 U/L (ref 11–51)

## 2023-02-16 LAB — PREGNANCY, URINE: Preg Test, Ur: NEGATIVE

## 2023-02-16 MED ORDER — NAPROXEN 375 MG PO TABS: 375.0000 mg | ORAL_TABLET | Freq: Two times a day (BID) | ORAL | 0 refills | Status: DC

## 2023-02-16 MED ORDER — CYCLOBENZAPRINE HCL 10 MG PO TABS: 10.0000 mg | ORAL_TABLET | Freq: Two times a day (BID) | ORAL | 0 refills | Status: AC | PRN

## 2023-02-16 NOTE — ED Notes (Signed)
Patient transported to X-ray 

## 2023-02-16 NOTE — ED Triage Notes (Addendum)
Pt came in via POV d/t Pain all over the past week. States it feels hard to catch her breth & hurts in her back when she takes a deep breath. The front of her abd hurts & radiates around to her back & up to mid back area. Her mouth also hurts when she bites down & reports a HA & sore throat that started yesterday. Rates her pain 10/10, denies vomiting but does endorse feeling nauseous, weakness & chills.

## 2023-02-16 NOTE — ED Provider Notes (Signed)
[place] EMERGENCY DEPARTMENT AT [facility] Provider Note   CSN: [id number] Arrival date & time: 02/16/23  4098     History  Chief Complaint  Patient presents with   Body Aches   Weakness   Chills   Headache    Julia Fowler is a 37 y.o. female.   Weakness Associated symptoms: headaches   Headache Associated symptoms: weakness      Patient has a history of rickets thyroid disease hypothyroidism.  She presents to the ED with complaints of generalized bodyaches.  Some discomfort in her abdomen as well as in her chest when taking a deep breath.  She also has this discomfort in her mouth.  She has had sore throat as well as headache.  She has not documented any fevers.  No vomiting.  No dysuria.  No rashes.  Home Medications Prior to Admission medications   Medication Sig Start Date End Date Taking? Authorizing Provider  cyclobenzaprine (FLEXERIL) 10 MG tablet Take 1 tablet (10 mg total) by mouth 2 (two) times daily as needed for muscle spasms. 02/16/23  Yes Linwood Dibbles, MD  naproxen (NAPROSYN) 375 MG tablet Take 1 tablet (375 mg total) by mouth 2 (two) times daily. 02/16/23  Yes Linwood Dibbles, MD  Burosumab-twza [facility]) Inject into the skin.    [provider]  cephALEXin (KEFLEX) 500 MG capsule 1 capsule Patient not taking: Reported on 01/29/2022 10/12/21   [provider]  clindamycin (CLEOCIN) 300 MG capsule Take 1 capsule (300 mg total) by mouth 3 (three) times daily. Patient not taking: Reported on 05/18/2021 03/08/21   Brock Bad, MD  etonogestrel (NEXPLANON) 68 MG IMPL implant 1 each by Subdermal route once. Patient not taking: Reported on 03/29/2022    [provider]  ibuprofen (ADVIL) 800 MG tablet TAKE 1 TABLET BY MOUTH EVERY 8 HOURS AS NEEDED 05/18/22   Brock Bad, MD  ibuprofen (ADVIL) 800 MG tablet Take 1 tablet (800 mg total) by mouth every 8 (eight) hours as needed. 12/25/22   Brock Bad, MD  levothyroxine  (SYNTHROID) 100 MCG tablet Take 1 tablet (100 mcg total) by mouth daily. 03/29/22   David Stall, MD  nitrofurantoin, macrocrystal-monohydrate, (MACROBID) 100 MG capsule Take 1 capsule (100 mg total) by mouth 2 (two) times daily. 1 po BID x 7days Patient not taking: Reported on 05/18/2021 11/17/20   Brock Bad, MD  norethindrone (AYGESTIN) 5 MG tablet Take 1 tablet (5 mg total) by mouth daily. Patient not taking: Reported on 03/29/2022 01/08/22   Warden Fillers, MD  ondansetron (ZOFRAN ODT) 4 MG disintegrating tablet Take 1 tablet (4 mg total) by mouth every 6 (six) hours as needed for nausea. Patient not taking: Reported on 08/24/2021 05/18/21   Warden Fillers, MD  Semaglutide-Weight Management (WEGOVY) 0.25 MG/0.5ML SOAJ Inject 0.25 mg into the skin once a week. 07/26/22     tamsulosin (FLOMAX) 0.4 MG CAPS capsule Take 1 capsule (0.4 mg total) by mouth daily. Patient not taking: Reported on 08/24/2021 05/18/21   Warden Fillers, MD  terconazole (TERAZOL 3) 0.8 % vaginal cream Place 1 applicator vaginally at bedtime. Patient not taking: Reported on 08/24/2021 03/08/21   Brock Bad, MD  traMADol (ULTRAM) 50 MG tablet Take 50 mg by mouth daily as needed. Patient not taking: Reported on 03/29/2022 12/18/21   [provider]  zolpidem (AMBIEN) 5 MG tablet Take 1 tablet (5 mg total) by mouth at bedtime  as needed for sleep. Patient not taking: Reported on 03/29/2022 01/05/20   Brock Bad, MD      Allergies    Other    Review of Systems   Review of Systems  Neurological:  Positive for weakness and headaches.    Physical Exam Updated Vital Signs BP 122/87   Pulse 92   Temp 97.9 F (36.6 C) (Oral)   Resp 19   Ht 1.499 m (4\' 11" )   Wt 104.3 kg   SpO2 100%   BMI 46.45 kg/m  Physical Exam Vitals and nursing note reviewed.  Constitutional:      General: She is not in acute distress.    Appearance: She is well-developed.  HENT:     Head: Normocephalic and  atraumatic.     Right Ear: External ear normal.     Left Ear: External ear normal.     Mouth/Throat:     Mouth: Mucous membranes are moist.  Eyes:     General: No scleral icterus.       Right eye: No discharge.        Left eye: No discharge.     Conjunctiva/sclera: Conjunctivae normal.  Neck:     Trachea: No tracheal deviation.  Cardiovascular:     Rate and Rhythm: Normal rate and regular rhythm.  Pulmonary:     Effort: Pulmonary effort is normal. No respiratory distress.     Breath sounds: Normal breath sounds. No stridor. No wheezing or rales.  Abdominal:     General: Bowel sounds are normal. There is no distension.     Palpations: Abdomen is soft.     Tenderness: There is no abdominal tenderness. There is no guarding or rebound.  Musculoskeletal:        General: No tenderness or deformity.     Cervical back: Neck supple.  Skin:    General: Skin is warm and dry.     Findings: No rash.  Neurological:     General: No focal deficit present.     Mental Status: She is alert.     Cranial Nerves: No cranial nerve deficit, dysarthria or facial asymmetry.     Sensory: No sensory deficit.     Motor: No abnormal muscle tone or seizure activity.     Coordination: Coordination normal.  Psychiatric:        Mood and Affect: Mood normal.     ED Results / Procedures / Treatments   Labs (all labs ordered are listed, but only abnormal results are displayed) Labs Reviewed  CBC WITH DIFFERENTIAL/PLATELET - Abnormal; Notable for the following components:      Result Value   Lymphs Abs 0.4 (*)    All other components within normal limits  COMPREHENSIVE METABOLIC PANEL - Abnormal; Notable for the following components:   Sodium 132 (*)    CO2 19 (*)    Calcium 8.5 (*)    Albumin 2.9 (*)    AST 43 (*)    Total Bilirubin 1.4 (*)    All other components within normal limits  URINALYSIS, W/ REFLEX TO CULTURE (INFECTION SUSPECTED) - Abnormal; Notable for the following components:   Color,  Urine AMBER (*)    APPearance HAZY (*)    Hgb urine dipstick LARGE (*)    Bacteria, UA RARE (*)    All other components within normal limits  GROUP A STREP BY PCR  SARS CORONAVIRUS 2 BY RT PCR  LIPASE, BLOOD  PREGNANCY, URINE    EKG  None  Radiology DG Chest 2 View  Result Date: 02/16/2023 CLINICAL DATA:  Dyspnea, myalgia EXAM: CHEST - 2 VIEW COMPARISON:  None Available. FINDINGS: Normal heart size. Normal mediastinal contour. No pneumothorax. No pleural effusion. Lungs appear clear, with no acute consolidative airspace disease and no pulmonary edema. IMPRESSION: No active cardiopulmonary disease. Electronically Signed   By: Delbert Phenix M.D.   On: 02/16/2023 08:41    Procedures Procedures    Medications Ordered in ED Medications - No data to display  ED Course/ Medical Decision Making/ A&P Clinical Course as of 02/16/23 1028  Sat Feb 16, 2023  0957 Pregnancy test is negative.  Lipase is normal.  Metabolic panel shows decreased bicarb but no other electrolyte abnormalities.  Urinalysis does show 6-10 RBCs rare bacteria squamous cells.  Not definitive for infection.  Covid strep is negative.  CBC normal. [JK]  0958 DG Chest 2 View Chest x-ray without acute abnormalities [JK]    Clinical Course User Index [JK] Linwood Dibbles, MD                                 Medical Decision Making Differential diagnosis includes but not limited to pneumonia pneumothorax, viral illness, strep throat, COVID, hepatitis, pancreatitis  Problems Addressed: Myalgia: acute illness or injury that poses a threat to life or bodily functions  Amount and/or Complexity of Data Reviewed Labs: ordered. Radiology: ordered. Decision-making details documented in ED Course.  Risk Prescription drug management.   Patient presented to the ED with complaints of generalized myalgias.  Patient's had some URI type symptoms as well.  ED workup reassuring.  No severe electrolyte abnormalities.  No signs of  leukocytosis.  Patient is negative for strep and negative for COVID.  Urinalysis not suggestive of urinary tract infection.  Chest x-ray without signs of pneumonia or pneumothorax.  Workup reassuring.  Possible patient may have a viral illness.  She appears appropriate for discharge home.  Will prescribe NSAIDs muscle relaxants for pain discomfort.  Discussed outpatient follow-up with PCP if not improving next week.        Final Clinical Impression(s) / ED Diagnoses Final diagnoses:  Myalgia    Rx / DC Orders ED Discharge Orders          Ordered    naproxen (NAPROSYN) 375 MG tablet  2 times daily        02/16/23 1026    cyclobenzaprine (FLEXERIL) 10 MG tablet  2 times daily PRN        02/16/23 1026              Linwood Dibbles, MD 02/16/23 1028

## 2023-02-16 NOTE — Discharge Instructions (Signed)
Take the medications as prescribed to help with your aches and pains.  Follow-up with a primary care doctor to be rechecked if your symptoms have not resolved in the next week.

## 2023-03-12 ENCOUNTER — Ambulatory Visit: Payer: Commercial Managed Care - HMO | Admitting: Family Medicine

## 2023-07-19 ENCOUNTER — Ambulatory Visit: Payer: Commercial Managed Care - HMO | Admitting: Obstetrics

## 2023-07-25 ENCOUNTER — Other Ambulatory Visit: Payer: Self-pay

## 2023-07-25 ENCOUNTER — Encounter: Payer: Self-pay | Admitting: Obstetrics

## 2023-07-25 DIAGNOSIS — N946 Dysmenorrhea, unspecified: Secondary | ICD-10-CM

## 2023-07-25 MED ORDER — IBUPROFEN 800 MG PO TABS
800.0000 mg | ORAL_TABLET | Freq: Three times a day (TID) | ORAL | 5 refills | Status: DC | PRN
Start: 2023-07-25 — End: 2024-03-16

## 2024-03-16 ENCOUNTER — Other Ambulatory Visit: Payer: Self-pay | Admitting: Obstetrics

## 2024-03-16 DIAGNOSIS — N946 Dysmenorrhea, unspecified: Secondary | ICD-10-CM

## 2024-06-22 ENCOUNTER — Other Ambulatory Visit: Payer: Self-pay

## 2024-06-23 ENCOUNTER — Other Ambulatory Visit: Payer: Self-pay

## 2024-06-23 MED ORDER — LIDOCAINE 5 % EX PTCH
MEDICATED_PATCH | CUTANEOUS | 2 refills | Status: AC
  Filled 2024-06-23: qty 10, 5d supply, fill #0

## 2024-06-23 MED ORDER — OXYCODONE-ACETAMINOPHEN 10-325 MG PO TABS
1.0000 | ORAL_TABLET | Freq: Four times a day (QID) | ORAL | 0 refills | Status: DC | PRN
  Filled 2024-06-23: qty 120, 30d supply, fill #0

## 2024-07-21 ENCOUNTER — Other Ambulatory Visit (HOSPITAL_COMMUNITY): Payer: Self-pay

## 2024-07-21 ENCOUNTER — Other Ambulatory Visit: Payer: Self-pay

## 2024-07-21 MED ORDER — PHENTERMINE HCL 37.5 MG PO TABS
37.5000 mg | ORAL_TABLET | Freq: Every day | ORAL | 0 refills | Status: AC
  Filled 2024-07-21: qty 30, 30d supply, fill #0

## 2024-07-21 MED ORDER — IBUPROFEN 800 MG PO TABS
800.0000 mg | ORAL_TABLET | Freq: Three times a day (TID) | ORAL | 0 refills | Status: AC | PRN
  Filled 2024-07-21: qty 90, 30d supply, fill #0

## 2024-07-21 MED ORDER — OXYCODONE-ACETAMINOPHEN 10-325 MG PO TABS
1.0000 | ORAL_TABLET | Freq: Four times a day (QID) | ORAL | 0 refills | Status: AC | PRN
  Filled 2024-07-23: qty 120, 30d supply, fill #0

## 2024-07-21 MED ORDER — LIDOCAINE 5 % EX PTCH
1.0000 | MEDICATED_PATCH | CUTANEOUS | 2 refills | Status: AC
  Filled 2024-07-21: qty 10, 5d supply, fill #0

## 2024-07-23 ENCOUNTER — Other Ambulatory Visit: Payer: Self-pay

## 2024-07-24 ENCOUNTER — Other Ambulatory Visit: Payer: Self-pay
